# Patient Record
Sex: Female | Born: 1940 | Race: White | Hispanic: No | Marital: Married | Smoking: Former smoker
Health system: Southern US, Community
[De-identification: ages and names within clinical notes are randomized; demographics above are authoritative.]

## PROBLEM LIST (undated history)

## (undated) DIAGNOSIS — R519 Headache, unspecified: Secondary | ICD-10-CM

## (undated) DIAGNOSIS — Z8709 Personal history of other diseases of the respiratory system: Secondary | ICD-10-CM

## (undated) DIAGNOSIS — G8929 Other chronic pain: Secondary | ICD-10-CM

## (undated) DIAGNOSIS — I82409 Acute embolism and thrombosis of unspecified deep veins of unspecified lower extremity: Secondary | ICD-10-CM

## (undated) DIAGNOSIS — T8859XA Other complications of anesthesia, initial encounter: Secondary | ICD-10-CM

## (undated) DIAGNOSIS — T4145XA Adverse effect of unspecified anesthetic, initial encounter: Secondary | ICD-10-CM

## (undated) DIAGNOSIS — R3915 Urgency of urination: Secondary | ICD-10-CM

## (undated) DIAGNOSIS — R51 Headache: Secondary | ICD-10-CM

## (undated) DIAGNOSIS — H269 Unspecified cataract: Secondary | ICD-10-CM

## (undated) DIAGNOSIS — M1990 Unspecified osteoarthritis, unspecified site: Secondary | ICD-10-CM

## (undated) DIAGNOSIS — R531 Weakness: Secondary | ICD-10-CM

## (undated) DIAGNOSIS — M549 Dorsalgia, unspecified: Secondary | ICD-10-CM

## (undated) DIAGNOSIS — M8580 Other specified disorders of bone density and structure, unspecified site: Secondary | ICD-10-CM

## (undated) DIAGNOSIS — Z8614 Personal history of Methicillin resistant Staphylococcus aureus infection: Secondary | ICD-10-CM

## (undated) HISTORY — PX: BACK SURGERY: SHX140

## (undated) HISTORY — PX: COLONOSCOPY: SHX174

---

## 1964-07-02 HISTORY — PX: APPENDECTOMY: SHX54

## 1984-07-02 HISTORY — PX: TUBAL LIGATION: SHX77

## 1999-06-20 ENCOUNTER — Encounter: Admission: RE | Admit: 1999-06-20 | Discharge: 1999-06-20 | Payer: Self-pay | Admitting: Family Medicine

## 1999-06-20 ENCOUNTER — Encounter: Payer: Self-pay | Admitting: Family Medicine

## 1999-11-20 ENCOUNTER — Ambulatory Visit (HOSPITAL_COMMUNITY): Admission: RE | Admit: 1999-11-20 | Discharge: 1999-11-20 | Payer: Self-pay | Admitting: Gastroenterology

## 1999-11-23 ENCOUNTER — Encounter: Payer: Self-pay | Admitting: Family Medicine

## 1999-11-23 ENCOUNTER — Encounter: Admission: RE | Admit: 1999-11-23 | Discharge: 1999-11-23 | Payer: Self-pay | Admitting: Family Medicine

## 2000-03-29 ENCOUNTER — Ambulatory Visit (HOSPITAL_COMMUNITY): Admission: RE | Admit: 2000-03-29 | Discharge: 2000-03-29 | Payer: Self-pay | Admitting: Family Medicine

## 2000-11-29 ENCOUNTER — Encounter: Admission: RE | Admit: 2000-11-29 | Discharge: 2000-11-29 | Payer: Self-pay | Admitting: Family Medicine

## 2000-11-29 ENCOUNTER — Encounter: Payer: Self-pay | Admitting: Family Medicine

## 2001-10-03 ENCOUNTER — Emergency Department (HOSPITAL_COMMUNITY): Admission: EM | Admit: 2001-10-03 | Discharge: 2001-10-04 | Payer: Self-pay | Admitting: Emergency Medicine

## 2001-12-09 ENCOUNTER — Encounter: Admission: RE | Admit: 2001-12-09 | Discharge: 2001-12-09 | Payer: Self-pay | Admitting: Family Medicine

## 2001-12-09 ENCOUNTER — Encounter: Payer: Self-pay | Admitting: Family Medicine

## 2002-06-08 ENCOUNTER — Encounter: Payer: Self-pay | Admitting: Family Medicine

## 2002-06-08 ENCOUNTER — Encounter: Admission: RE | Admit: 2002-06-08 | Discharge: 2002-06-08 | Payer: Self-pay | Admitting: Family Medicine

## 2003-01-27 ENCOUNTER — Encounter: Admission: RE | Admit: 2003-01-27 | Discharge: 2003-01-27 | Payer: Self-pay | Admitting: Family Medicine

## 2003-01-27 ENCOUNTER — Encounter: Payer: Self-pay | Admitting: Family Medicine

## 2004-02-09 ENCOUNTER — Encounter: Admission: RE | Admit: 2004-02-09 | Discharge: 2004-02-09 | Payer: Self-pay | Admitting: Family Medicine

## 2004-03-17 ENCOUNTER — Ambulatory Visit (HOSPITAL_COMMUNITY): Admission: RE | Admit: 2004-03-17 | Discharge: 2004-03-17 | Payer: Self-pay | Admitting: Family Medicine

## 2004-04-24 ENCOUNTER — Other Ambulatory Visit: Admission: RE | Admit: 2004-04-24 | Discharge: 2004-04-24 | Payer: Self-pay | Admitting: Family Medicine

## 2005-03-08 ENCOUNTER — Encounter: Admission: RE | Admit: 2005-03-08 | Discharge: 2005-03-08 | Payer: Self-pay | Admitting: Family Medicine

## 2005-04-24 ENCOUNTER — Encounter: Admission: RE | Admit: 2005-04-24 | Discharge: 2005-04-24 | Payer: Self-pay | Admitting: Family Medicine

## 2005-08-02 ENCOUNTER — Other Ambulatory Visit: Admission: RE | Admit: 2005-08-02 | Discharge: 2005-08-02 | Payer: Self-pay | Admitting: Physician Assistant

## 2006-04-04 ENCOUNTER — Encounter: Admission: RE | Admit: 2006-04-04 | Discharge: 2006-04-04 | Payer: Self-pay | Admitting: Family Medicine

## 2006-08-06 ENCOUNTER — Other Ambulatory Visit: Admission: RE | Admit: 2006-08-06 | Discharge: 2006-08-06 | Payer: Self-pay | Admitting: Family Medicine

## 2006-11-29 ENCOUNTER — Encounter: Admission: RE | Admit: 2006-11-29 | Discharge: 2006-11-29 | Payer: Self-pay | Admitting: Neurosurgery

## 2007-04-11 ENCOUNTER — Encounter: Admission: RE | Admit: 2007-04-11 | Discharge: 2007-04-11 | Payer: Self-pay | Admitting: Family Medicine

## 2007-07-03 DIAGNOSIS — Z8614 Personal history of Methicillin resistant Staphylococcus aureus infection: Secondary | ICD-10-CM

## 2007-07-03 HISTORY — DX: Personal history of Methicillin resistant Staphylococcus aureus infection: Z86.14

## 2007-07-03 HISTORY — PX: OTHER SURGICAL HISTORY: SHX169

## 2007-07-03 HISTORY — PX: ABDOMINAL HYSTERECTOMY: SHX81

## 2007-08-08 ENCOUNTER — Encounter: Admission: RE | Admit: 2007-08-08 | Discharge: 2007-08-08 | Payer: Self-pay | Admitting: Neurosurgery

## 2007-10-06 ENCOUNTER — Other Ambulatory Visit: Payer: Self-pay | Admitting: Neurosurgery

## 2007-10-20 ENCOUNTER — Ambulatory Visit (HOSPITAL_COMMUNITY): Admission: RE | Admit: 2007-10-20 | Discharge: 2007-10-21 | Payer: Self-pay | Admitting: Neurosurgery

## 2008-04-16 ENCOUNTER — Encounter: Admission: RE | Admit: 2008-04-16 | Discharge: 2008-04-16 | Payer: Self-pay | Admitting: Family Medicine

## 2008-06-22 ENCOUNTER — Encounter (INDEPENDENT_AMBULATORY_CARE_PROVIDER_SITE_OTHER): Payer: Self-pay | Admitting: Obstetrics and Gynecology

## 2008-06-22 ENCOUNTER — Ambulatory Visit (HOSPITAL_COMMUNITY): Admission: RE | Admit: 2008-06-22 | Discharge: 2008-06-23 | Payer: Self-pay | Admitting: Obstetrics and Gynecology

## 2008-09-23 ENCOUNTER — Encounter: Admission: RE | Admit: 2008-09-23 | Discharge: 2008-09-23 | Payer: Self-pay | Admitting: Neurosurgery

## 2009-06-07 ENCOUNTER — Inpatient Hospital Stay (HOSPITAL_COMMUNITY): Admission: RE | Admit: 2009-06-07 | Discharge: 2009-06-15 | Payer: Self-pay | Admitting: Neurosurgery

## 2009-12-15 ENCOUNTER — Encounter: Admission: RE | Admit: 2009-12-15 | Discharge: 2009-12-15 | Payer: Self-pay | Admitting: Family Medicine

## 2010-07-23 ENCOUNTER — Encounter: Payer: Self-pay | Admitting: Family Medicine

## 2010-10-03 LAB — BASIC METABOLIC PANEL
BUN: 10 mg/dL (ref 6–23)
BUN: 16 mg/dL (ref 6–23)
CO2: 27 mEq/L (ref 19–32)
Calcium: 8.6 mg/dL (ref 8.4–10.5)
Chloride: 102 mEq/L (ref 96–112)
Creatinine, Ser: 0.87 mg/dL (ref 0.4–1.2)
Creatinine, Ser: 0.88 mg/dL (ref 0.4–1.2)
GFR calc Af Amer: >60 mL/min (ref >60)
GFR calc non Af Amer: >60 mL/min (ref >60)
Glucose, Bld: 102 mg/dL — ABNORMAL HIGH (ref 70–99)
Glucose, Bld: 94 mg/dL (ref 70–99)
Potassium: 3.9 mEq/L (ref 3.5–5.1)

## 2010-10-03 LAB — CBC
HCT: 36.3 % (ref 36.0–46.0)
HCT: 40.2 % (ref 36.0–46.0)
Hemoglobin: 12.2 g/dL (ref 12.0–15.0)
Hemoglobin: 14.1 g/dL (ref 12.0–15.0)
MCHC: 33.7 g/dL (ref 30.0–36.0)
MCV: 92.7 fL (ref 78.0–100.0)
RBC: 3.83 MIL/uL — ABNORMAL LOW (ref 3.87–5.11)
RBC: 4.34 MIL/uL (ref 3.87–5.11)

## 2010-10-03 LAB — TYPE AND SCREEN: Antibody Screen: NEGATIVE

## 2010-11-14 NOTE — Op Note (Signed)
NAMEELIZET, KAPLAN NO.:  [id number]   MEDICAL RECORD NO.:  [id number]          PATIENT TYPE:  INP   LOCATION:  3010                         FACILITY:  [facility]   PHYSICIAN:  Danae Orleans. Venetia Maxon, M.D.  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   PROCEDURE:  1. Left L5-S1 microdiskectomy.  2. Microdissection.  3. Laminoforaminotomy L4-5 left with Baxano decompression.   SURGEON:  Danae Orleans. Venetia Maxon, M.D.   ASSISTANT:  Hewitt Shorts, M.D. and Georgiann Cocker, RN.   ANESTHESIA:  General endotracheal anesthesia.   ESTIMATED BLOOD LOSS:  Minimal.   COMPLICATIONS:  None.   DISPOSITION:  Recovery.   INDICATIONS:  Sandra [name] is a 70 year old woman with left leg pain,  lumbar scoliosis, and spondylosis with a disk herniation at L5-S1 on the  left and significant lateral recess stenosis at L4-5 on the left.  It  was elected to take her to surgery for decompression at the L4-5 level  using Baxano decompression system and left L5-S1 microdiskectomy.   PROCEDURE:  Sandra [name] was brought to the operating room.  Following  satisfactory and uncomplicated induction of general endotracheal  anesthesia and placed venous lines, the patient was placed in prone  position on the Wilson frame.  Her low back was prepped and draped in  the usual sterile fashion using a Betadine scrub and paint.  Incision  was made in the midline, carried through to the lumbodorsal fascia was  incised in the left side of midline.  Subperiosteal dissection was  performed exposing the L4-5 and L5-S1 interspace on the left.  Using the  C-arm for fluoroscopy to confirm the level  hemi-semi laminectomy of L5  was performed and the redundant ligamentous tissue was removed.  The  thecal sac and S1 nerve root decompressed.  There was a large disk  herniation direct with a fragment of herniated disk material directly  beneath the S1 nerve root.  The D'Errico nerve root retractor and  microdissection technique, the S1 nerve root was retracted medially.  The thecal sac was retracted medially and the herniated disk was  removed.  The disk herniation extended into the interspace and it was  elected to more aggressively decompress the interspace because of this  using a variety of Epstein curettes and a variety of pituitary rongeurs,  the disk material was removed from the interspace laterally and  decompressing the thecal sac, redundant ligamentous tissue, and annular  tissue was also removed Kerrison rongeur.  Subsequently, the L4-5 level  laminotomy was performed with a high-speed drill and laminar window of  the ligamentous tissue was removed exposing the dura using the Baxano  decompression system the lateral thecal sac and foraminal pass was  performed with the removal of ligamentous tissue after introducing the  probe,  then wire followed by EMG nerve root potential with recordings  along the lateral sac and along the foramen.  Subsequently, the lateral  canal and foramen were decompressed and using 30 reciprocating strokes  after confirming no evidence of electrical activity with the rib in  stimulation probe, the multi blade shaver was introduced and 30  reciprocations were used with good decompression of the lateral canal  and along the foramen.  A second pass was then performed along the L5  nerve root and again there was no evidence of electrical stimulation and  30 reciprocations were performed.  Coronary dilator probe was inserted  along the foramen in and out the nerve root along the nerve root and did  not appear to be any residual nerve root compression.   Hemostasis was  assured with Gelfoam soaked in thrombin.  The wound was then copiously  irrigated bacitracin and saline.  The lumbodorsal fascia was closed with  0 Vicryl sutures, subcutaneous tissues were reapproximated 2-0 Vicryl  interrupted inverted sutures and skin edges were reapproximated 3-0  Vicryl subcuticular stitch.  Wound was dressed with Dermabond.  The  patient was extubated in the operating room and taken to recovery in  stable and satisfactory condition having tolerated operation well.  Counts were correct at the end of the case.      Danae Orleans. Venetia Maxon, M.D.  Electronically Signed     JDS/MEDQ  D:  10/20/2007  T:  10/21/2007  Job:  [id number]

## 2010-11-14 NOTE — Op Note (Signed)
NAME:  Sandra [name], Sandra [name] NO.:  [id number]   MEDICAL RECORD NO.:  [id number]          PATIENT TYPE:  INP   LOCATION:  9320                          FACILITY:  [facility]   PHYSICIAN:  Charles A. Delcambre, MDDATE OF BIRTH:  06/25/1941   DATE OF PROCEDURE:  06/22/2008  DATE OF DISCHARGE:                               OPERATIVE REPORT   PREOPERATIVE DIAGNOSES:  1. Cystocele.  2. Uterine prolapse.   PROCEDURES:  Laparoscopic-assisted vaginal hysterectomy and bilateral  salpingo-oophorectomy anterior repair.   SURGEON:  Charles A. Sydnee Cabal, MD   ASSISTANT:  Dr. Richardson Dopp.   COMPLICATIONS:  None.   ESTIMATED BLOOD LOSS:  100 mL.   ANESTHESIA:  General anesthesia.   FINDINGS:  Moderate cystocele, uterus prolapsed down to the introitus.  Normal upper abdominal findings grossly noted, normal pelvis otherwise.   URINE OUTPUT:  200 mL.   COMPLICATIONS:  None.   DESCRIPTION OF PROCEDURE:  The patient was taken to the operating room,  placed supine in position.  General anesthetic was induced without  difficulty.  She was then placed in dorsal lithotomy position.  Sterile  prep and drape was undertaken.  A Cohen cannula was placed on the cervix  for manipulation during the case.  Attention was turned to the abdomen.  Betadine cleansing of the umbilicus with sterile prep and drape was done  to get some material out of the umbilicus.  The area was recleaned with  copious Betadine.  A 1-cm incision was made in the skin crease at the  umbilicus.  Anterior traction was placed.  Veress needle was placed.  Cohen cannula was placed with tenaculum on the cervix.  Assessment at  that time yielded only cystocele, no rectocele, and again the uterine  prolapse as noted above.  Attention was turned to the abdomen.  Veress  needle was placed with anterior traction on the abdominal wall.  Aspiration, injection and reaspiration, hanging drop test all indicated  intra-abdominal placement. CO2  insufflation pressure remained less than  8 mm Hg to adequate pneumoperitoneum..  After 2 L insufflation of CO2,  the Veress needle was removed.  A 10-mm port was placed.  Immediate  verification was done showing no evidence of damage to the bowel,  bladder, or vascular structures.  Two stab incisions were made laterally  below the umbilicus.  These injection sites were injected with 0.25%  Marcaine, total of approximately 8 mL.  We utilized the PK instrument to  cauterize and seal the isolated infundibulopelvic pedicles, well away  from the ureters bilaterally.  Ureters were clearly seen around the  sidewall using the PK with direct visualization of the ureter distally  as well as proximally.  Great care was given to avoid bowel, bladder, or  vascular structures.  The infundibulopelvic pedicles were transected  bilaterally.  After localizing the ureter, the round ligaments were  taken and vesicouterine peritoneum was skeletonized to achieve  assistance with anterior opening.  Desufflation was allowed to occur at  that point.  The attention was then turned to the pelvis and the vaginal  part of  the case.  Dilute Pitressin was injected circumferentially on  the cervix.  The bladder was then taken off of the cervix and lower  uterine segment with blunt dissection.  A Ray-Tec and a finger was  passed up the vesicovaginal space and entered the peritoneum.  Retractor  was placed.  Posterior colpotomy was done, retractor was placed.  Uterosacral ligaments were then isolated, tied, and held with 0 Vicryl.  Successive pedicles up the uterus took the uterine vessels with  transfixion stitch and good hemostasis resulting.  A final pedicle was  taken up beyond the portion of the laparoscopic dissection and the  uterus was removed.  All stitches were removed except the uterosacral  ligaments.  Richardson angle suture was then placed on either end  suspending the vagina to the uterosacral ligaments.   Cuff was then  closed after anterior repair was done.  Anterior repair was done by  undermining vaginal mucosa, cystocele mucosa.  Metzenbaums were used to  open the cystocele and blunt dissection was used to develop the bladder  flaps.  2-0 Vicryl sutures plicating the endopelvic fascia with a strong  layer created to support the bladder and eliminate the cystocele.  Excess vaginal mucosa was removed and discarded.  The vaginal mucosa was  then closed with running locking 2-0 Vicryl.  Hemostsasis was good.  The  cuff was then run and locked with 0 Vicryl, 2-inch gauze with Estrace  cream was packed in vagina.  A final look with the scope above  pneumoperitoneum was achieved.  There was a raw edge oozing slightly  over the left sidewall above the uterosacral ligament but away from the  visualized ureter.  This was cauterized without damage to achieve good  hemostasis.  Low-pressure visualization was noted.  All instruments  removed.  Desufflation was allowed to occur.  A 0 Vicryl was used at the  umbilicus to close the fascial stitch.  A 4-0 Monocryl was used for the  skin.  Dermabond was placed on the lower two trocar sites.  The patient  was awakened, extubated, and taken to recovery having tolerated the  procedure well with physician in attendance.      Charles A. Sydnee Cabal, MD  Electronically Signed     CAD/MEDQ  D:  06/22/2008  T:  06/22/2008  Job:  [id number]

## 2010-11-17 NOTE — Procedures (Signed)
[facility]  Patient:    Sandra [name], Sandra [name]                        MRN: [id number] Proc. Date: 11/20/99 Adm. Date:  [id number] Disc. Date: [id number] Attending:  Louie Bun CC:         Jethro Bastos, M.D.                           Procedure Report  PROCEDURE PERFORMED:  Colonoscopy.  ENDOSCOPIST:  Everardo All. Madilyn Fireman, M.D.  INDICATIONS FOR PROCEDURE:  Family history of colon cancer in a first degree relative.  DESCRIPTION OF PROCEDURE:  The patient was placed in the left lateral decubitus position and placed on the pulse monitor with continuous low-flow oxygen delivered by nasal cannula.  She was sedated with 60 mg IV Demerol and 7 mg IV Versed.  The Olympus video colonoscope was inserted into the rectum and advanced to the cecum, confirmed by transillumination of McBurneys point and visualization of the ileocecal valve and appendiceal orifice.  The prep was excellent.  The cecum, ascending, transverse, descending and sigmoid colon all appeared normal with no masses, polyps, diverticula or other mucosal abnormalities.  The rectum likewise appeared normal on retroflex view.  The anus did reveal some small internal hemorrhoids.  The colonoscope was then withdrawn and the patient returned to the recovery room in stable condition. The patient tolerated the procedure well and there were no immediate complications.  IMPRESSION:  Internal hemorrhoids, otherwise normal colonoscopy.  PLAN:  Repeat colonoscopy in five years based on her family history. DD:  11/20/99 TD:  11/23/99 Job: [id number]

## 2010-11-21 ENCOUNTER — Other Ambulatory Visit: Payer: Self-pay | Admitting: Family Medicine

## 2010-11-21 DIAGNOSIS — Z1231 Encounter for screening mammogram for malignant neoplasm of breast: Secondary | ICD-10-CM

## 2010-11-24 ENCOUNTER — Other Ambulatory Visit: Payer: Self-pay | Admitting: Family Medicine

## 2010-11-24 ENCOUNTER — Ambulatory Visit
Admission: RE | Admit: 2010-11-24 | Discharge: 2010-11-24 | Disposition: A | Discharge status: Home / Self Care | Payer: Medicare Other | Source: Ambulatory Visit | Attending: Family Medicine | Admitting: Family Medicine

## 2010-11-24 DIAGNOSIS — S20219A Contusion of unspecified front wall of thorax, initial encounter: Secondary | ICD-10-CM

## 2010-12-26 ENCOUNTER — Ambulatory Visit
Admission: RE | Admit: 2010-12-26 | Discharge: 2010-12-26 | Disposition: A | Discharge status: Home / Self Care | Payer: Medicare Other | Source: Ambulatory Visit | Attending: Family Medicine | Admitting: Family Medicine

## 2010-12-26 DIAGNOSIS — Z1231 Encounter for screening mammogram for malignant neoplasm of breast: Secondary | ICD-10-CM

## 2011-03-27 LAB — CBC
HCT: 40.7
Hemoglobin: 13.9
MCHC: 34.2
Platelets: 270
RDW: 12.4
RDW: 12.6

## 2011-03-27 LAB — BASIC METABOLIC PANEL
BUN: 8
BUN: 12
CO2: 30
Calcium: 10.3
Calcium: 9.7
GFR calc non Af Amer: >60
Glucose, Bld: 95
Glucose, Bld: 91
Sodium: 139
Sodium: 137

## 2011-04-06 LAB — COMPREHENSIVE METABOLIC PANEL
ALT: 12 U/L (ref 0–35)
Alkaline Phosphatase: 48 U/L (ref 39–117)
CO2: 27 mEq/L (ref 19–32)
GFR calc non Af Amer: >60 mL/min (ref >60)
Glucose, Bld: 85 mg/dL (ref 70–99)
Potassium: 4.1 mEq/L (ref 3.5–5.1)
Sodium: 135 mEq/L (ref 135–145)

## 2011-04-06 LAB — CBC
HCT: 29.4 % — ABNORMAL LOW (ref 36.0–46.0)
Hemoglobin: 13.2 g/dL (ref 12.0–15.0)
MCV: 92.9 fL (ref 78.0–100.0)
Platelets: 165 10*3/uL (ref 150–400)
RBC: 4.28 MIL/uL (ref 3.87–5.11)
WBC: 10.1 10*3/uL (ref 4.0–10.5)

## 2011-04-06 LAB — ABO/RH: ABO/RH(D): O POS

## 2011-07-16 DIAGNOSIS — Z79899 Other long term (current) drug therapy: Secondary | ICD-10-CM | POA: Diagnosis not present

## 2011-11-12 DIAGNOSIS — M4120 Other idiopathic scoliosis, site unspecified: Secondary | ICD-10-CM | POA: Diagnosis not present

## 2011-11-13 ENCOUNTER — Other Ambulatory Visit: Payer: Self-pay | Admitting: Neurosurgery

## 2011-11-13 ENCOUNTER — Other Ambulatory Visit (HOSPITAL_COMMUNITY): Payer: Self-pay | Admitting: Neurosurgery

## 2011-11-13 DIAGNOSIS — M546 Pain in thoracic spine: Secondary | ICD-10-CM

## 2011-11-15 ENCOUNTER — Ambulatory Visit (HOSPITAL_COMMUNITY)
Admission: RE | Admit: 2011-11-15 | Discharge: 2011-11-15 | Disposition: A | Discharge status: Home / Self Care | Payer: Medicare Other | Source: Ambulatory Visit | Attending: Neurosurgery | Admitting: Neurosurgery

## 2011-11-15 DIAGNOSIS — M5126 Other intervertebral disc displacement, lumbar region: Secondary | ICD-10-CM | POA: Diagnosis not present

## 2011-11-15 DIAGNOSIS — M47817 Spondylosis without myelopathy or radiculopathy, lumbosacral region: Secondary | ICD-10-CM | POA: Diagnosis not present

## 2011-11-15 DIAGNOSIS — R209 Unspecified disturbances of skin sensation: Secondary | ICD-10-CM | POA: Diagnosis not present

## 2011-11-15 DIAGNOSIS — M546 Pain in thoracic spine: Secondary | ICD-10-CM | POA: Diagnosis not present

## 2011-11-15 DIAGNOSIS — Y831 Surgical operation with implant of artificial internal device as the cause of abnormal reaction of the patient, or of later complication, without mention of misadventure at the time of the procedure: Secondary | ICD-10-CM | POA: Insufficient documentation

## 2011-11-15 DIAGNOSIS — M51379 Other intervertebral disc degeneration, lumbosacral region without mention of lumbar back pain or lower extremity pain: Secondary | ICD-10-CM | POA: Insufficient documentation

## 2011-11-15 DIAGNOSIS — M899 Disorder of bone, unspecified: Secondary | ICD-10-CM | POA: Insufficient documentation

## 2011-11-15 DIAGNOSIS — M25559 Pain in unspecified hip: Secondary | ICD-10-CM | POA: Insufficient documentation

## 2011-11-15 DIAGNOSIS — M4120 Other idiopathic scoliosis, site unspecified: Secondary | ICD-10-CM | POA: Diagnosis not present

## 2011-11-15 DIAGNOSIS — M4310 Spondylolisthesis, site unspecified: Secondary | ICD-10-CM | POA: Diagnosis not present

## 2011-11-15 DIAGNOSIS — T84498A Other mechanical complication of other internal orthopedic devices, implants and grafts, initial encounter: Secondary | ICD-10-CM | POA: Diagnosis not present

## 2011-11-15 DIAGNOSIS — M5137 Other intervertebral disc degeneration, lumbosacral region: Secondary | ICD-10-CM | POA: Insufficient documentation

## 2011-11-15 DIAGNOSIS — M519 Unspecified thoracic, thoracolumbar and lumbosacral intervertebral disc disorder: Secondary | ICD-10-CM | POA: Diagnosis not present

## 2011-11-15 MED ORDER — ONDANSETRON HCL 4 MG/2ML IJ SOLN: 4.0000 mg | Freq: Four times a day (QID) | INTRAMUSCULAR | Status: DC | PRN

## 2011-11-15 MED ORDER — IOHEXOL 300 MG/ML  SOLN
10.0000 mL | Freq: Once | INTRAMUSCULAR | Status: AC | PRN
  Administered 2011-11-15: 10 mL via INTRATHECAL

## 2011-11-15 MED ORDER — DIAZEPAM 5 MG PO TABS: 10.0000 mg | ORAL_TABLET | Freq: Once | ORAL | Status: DC

## 2011-11-15 MED ORDER — OXYCODONE HCL 5 MG PO TABS: 10.0000 mg | ORAL_TABLET | ORAL | Status: DC | PRN

## 2011-11-15 MED ORDER — DIAZEPAM 5 MG PO TABS
10.0000 mg | ORAL_TABLET | Freq: Once | ORAL | Status: AC
  Administered 2011-11-15: 10 mg via ORAL
  Filled 2011-11-15: qty 2

## 2011-11-15 NOTE — Discharge Instructions (Signed)
Myelography Care After These instructions give you information on caring for yourself after your procedure. Your doctor may also give you specific instructions. Call your doctor if you have any problems or questions after your procedure. HOME CARE  Lie down for 24 hours. Lie in any position with 1 pillow under your head.   For 24 hours, get up only to eat or use the bathroom. Take only 10 minutes to eat.   For 24 hours, drink enough fluids to keep your pee (urine) clear or pale yellow. No alcohol.   Take all medicine as told by your doctor.   Avoid heavy lifting and activity for 48 hours.   You may take the bandage off the day after your myelography.   Do not take a bath for 24 hours. Ask your doctor if it is okay to take a shower.  Finding out the results of your test Ask your doctor when your test results will be ready. Make sure you follow up and get the test results. GET HELP RIGHT AWAY IF:   Any of the places where the needles were put in:   Are puffy (swollen) or red.   Are sore or hot to the touch.   Are draining yellowish-white fluid (pus).   Are bleeding after 10 minutes of pressing down on the site. Have someone press on any place that is bleeding until seen by a doctor.   You have a lasting headache that is not helped by medicine.   You have a bad headache with a stiff neck or fever.   You have trouble breathing.   You feel sick to your stomach (nauseous) or throw up (vomit).   You have pain or cramping in your belly (abdomen).   You have a fever.  If you go to the emergency room, tell the doctor you had a myelogram. Take this paper with you to show the doctor. MAKE SURE YOU:  Understand these instructions.   Will watch your condition.   Will get help right away if you are not doing well or get worse.  Document Released: 03/27/2008 Document Revised: 06/07/2011 Document Reviewed: 03/27/2008 Exit[employer] Patient Information 2012 [employer]. 

## 2011-11-15 NOTE — Procedures (Signed)
Omnipaque 300 6 cc, L 2/3

## 2011-11-28 ENCOUNTER — Other Ambulatory Visit: Payer: Self-pay | Admitting: Family Medicine

## 2011-11-28 DIAGNOSIS — Z1231 Encounter for screening mammogram for malignant neoplasm of breast: Secondary | ICD-10-CM

## 2011-12-03 DIAGNOSIS — M5126 Other intervertebral disc displacement, lumbar region: Secondary | ICD-10-CM | POA: Diagnosis not present

## 2011-12-03 DIAGNOSIS — IMO0002 Reserved for concepts with insufficient information to code with codable children: Secondary | ICD-10-CM | POA: Diagnosis not present

## 2011-12-03 DIAGNOSIS — M47817 Spondylosis without myelopathy or radiculopathy, lumbosacral region: Secondary | ICD-10-CM | POA: Diagnosis not present

## 2011-12-03 DIAGNOSIS — M4120 Other idiopathic scoliosis, site unspecified: Secondary | ICD-10-CM | POA: Diagnosis not present

## 2011-12-14 DIAGNOSIS — M4120 Other idiopathic scoliosis, site unspecified: Secondary | ICD-10-CM | POA: Diagnosis not present

## 2011-12-14 DIAGNOSIS — M5126 Other intervertebral disc displacement, lumbar region: Secondary | ICD-10-CM | POA: Diagnosis not present

## 2012-01-01 ENCOUNTER — Ambulatory Visit: Payer: Medicare Other

## 2012-01-17 DIAGNOSIS — M4120 Other idiopathic scoliosis, site unspecified: Secondary | ICD-10-CM | POA: Diagnosis not present

## 2012-01-22 ENCOUNTER — Ambulatory Visit
Admission: RE | Admit: 2012-01-22 | Discharge: 2012-01-22 | Disposition: A | Discharge status: Home / Self Care | Payer: Medicare Other | Source: Ambulatory Visit | Attending: Family Medicine | Admitting: Family Medicine

## 2012-01-22 DIAGNOSIS — Z1231 Encounter for screening mammogram for malignant neoplasm of breast: Secondary | ICD-10-CM | POA: Diagnosis not present

## 2012-01-25 ENCOUNTER — Other Ambulatory Visit: Payer: Self-pay | Admitting: Family Medicine

## 2012-01-25 DIAGNOSIS — R928 Other abnormal and inconclusive findings on diagnostic imaging of breast: Secondary | ICD-10-CM

## 2012-01-30 ENCOUNTER — Ambulatory Visit
Admission: RE | Admit: 2012-01-30 | Discharge: 2012-01-30 | Disposition: A | Discharge status: Home / Self Care | Payer: Medicare Other | Source: Ambulatory Visit | Attending: Family Medicine | Admitting: Family Medicine

## 2012-01-30 ENCOUNTER — Other Ambulatory Visit: Payer: Self-pay | Admitting: Family Medicine

## 2012-01-30 DIAGNOSIS — R928 Other abnormal and inconclusive findings on diagnostic imaging of breast: Secondary | ICD-10-CM

## 2012-05-21 DIAGNOSIS — M4120 Other idiopathic scoliosis, site unspecified: Secondary | ICD-10-CM | POA: Diagnosis not present

## 2012-05-27 DIAGNOSIS — M545 Low back pain, unspecified: Secondary | ICD-10-CM | POA: Diagnosis not present

## 2012-05-27 DIAGNOSIS — M5137 Other intervertebral disc degeneration, lumbosacral region: Secondary | ICD-10-CM | POA: Diagnosis not present

## 2012-06-19 DIAGNOSIS — M545 Low back pain, unspecified: Secondary | ICD-10-CM | POA: Diagnosis not present

## 2012-08-07 DIAGNOSIS — M545 Low back pain, unspecified: Secondary | ICD-10-CM | POA: Diagnosis not present

## 2012-08-07 DIAGNOSIS — M62838 Other muscle spasm: Secondary | ICD-10-CM | POA: Diagnosis not present

## 2012-09-08 DIAGNOSIS — M545 Low back pain, unspecified: Secondary | ICD-10-CM | POA: Diagnosis not present

## 2012-09-25 DIAGNOSIS — M545 Low back pain, unspecified: Secondary | ICD-10-CM | POA: Diagnosis not present

## 2012-10-06 DIAGNOSIS — Z0000 Encounter for general adult medical examination without abnormal findings: Secondary | ICD-10-CM | POA: Diagnosis not present

## 2012-10-06 DIAGNOSIS — N318 Other neuromuscular dysfunction of bladder: Secondary | ICD-10-CM | POA: Diagnosis not present

## 2012-10-06 DIAGNOSIS — Z79899 Other long term (current) drug therapy: Secondary | ICD-10-CM | POA: Diagnosis not present

## 2012-10-06 DIAGNOSIS — M899 Disorder of bone, unspecified: Secondary | ICD-10-CM | POA: Diagnosis not present

## 2012-10-06 DIAGNOSIS — F17200 Nicotine dependence, unspecified, uncomplicated: Secondary | ICD-10-CM | POA: Diagnosis not present

## 2012-10-06 DIAGNOSIS — Z23 Encounter for immunization: Secondary | ICD-10-CM | POA: Diagnosis not present

## 2012-10-06 DIAGNOSIS — E7800 Pure hypercholesterolemia, unspecified: Secondary | ICD-10-CM | POA: Diagnosis not present

## 2012-10-23 DIAGNOSIS — IMO0002 Reserved for concepts with insufficient information to code with codable children: Secondary | ICD-10-CM | POA: Diagnosis not present

## 2012-10-23 DIAGNOSIS — M545 Low back pain, unspecified: Secondary | ICD-10-CM | POA: Diagnosis not present

## 2012-10-23 DIAGNOSIS — M5137 Other intervertebral disc degeneration, lumbosacral region: Secondary | ICD-10-CM | POA: Diagnosis not present

## 2012-11-07 DIAGNOSIS — H43819 Vitreous degeneration, unspecified eye: Secondary | ICD-10-CM | POA: Diagnosis not present

## 2012-11-07 DIAGNOSIS — H269 Unspecified cataract: Secondary | ICD-10-CM | POA: Diagnosis not present

## 2012-11-28 DIAGNOSIS — M545 Low back pain, unspecified: Secondary | ICD-10-CM | POA: Diagnosis not present

## 2012-11-28 DIAGNOSIS — IMO0002 Reserved for concepts with insufficient information to code with codable children: Secondary | ICD-10-CM | POA: Diagnosis not present

## 2012-12-11 DIAGNOSIS — N951 Menopausal and female climacteric states: Secondary | ICD-10-CM | POA: Diagnosis not present

## 2012-12-23 ENCOUNTER — Other Ambulatory Visit: Payer: Self-pay

## 2012-12-23 DIAGNOSIS — Z1231 Encounter for screening mammogram for malignant neoplasm of breast: Secondary | ICD-10-CM

## 2013-01-19 DIAGNOSIS — D235 Other benign neoplasm of skin of trunk: Secondary | ICD-10-CM | POA: Diagnosis not present

## 2013-01-19 DIAGNOSIS — F17200 Nicotine dependence, unspecified, uncomplicated: Secondary | ICD-10-CM | POA: Diagnosis not present

## 2013-01-30 ENCOUNTER — Ambulatory Visit: Payer: Medicare Other

## 2013-02-20 ENCOUNTER — Ambulatory Visit
Admission: RE | Admit: 2013-02-20 | Discharge: 2013-02-20 | Disposition: A | Discharge status: Home / Self Care | Payer: Medicare Other | Source: Ambulatory Visit

## 2013-02-20 DIAGNOSIS — Z1231 Encounter for screening mammogram for malignant neoplasm of breast: Secondary | ICD-10-CM | POA: Diagnosis not present

## 2013-02-27 DIAGNOSIS — H2510 Age-related nuclear cataract, unspecified eye: Secondary | ICD-10-CM | POA: Diagnosis not present

## 2013-02-27 DIAGNOSIS — H5210 Myopia, unspecified eye: Secondary | ICD-10-CM | POA: Diagnosis not present

## 2013-02-27 DIAGNOSIS — H25019 Cortical age-related cataract, unspecified eye: Secondary | ICD-10-CM | POA: Diagnosis not present

## 2013-02-27 DIAGNOSIS — H25049 Posterior subcapsular polar age-related cataract, unspecified eye: Secondary | ICD-10-CM | POA: Diagnosis not present

## 2013-02-27 DIAGNOSIS — H18419 Arcus senilis, unspecified eye: Secondary | ICD-10-CM | POA: Diagnosis not present

## 2013-03-05 DIAGNOSIS — M545 Low back pain, unspecified: Secondary | ICD-10-CM | POA: Diagnosis not present

## 2013-03-05 DIAGNOSIS — M25539 Pain in unspecified wrist: Secondary | ICD-10-CM | POA: Diagnosis not present

## 2013-03-05 DIAGNOSIS — IMO0002 Reserved for concepts with insufficient information to code with codable children: Secondary | ICD-10-CM | POA: Diagnosis not present

## 2013-03-10 DIAGNOSIS — D126 Benign neoplasm of colon, unspecified: Secondary | ICD-10-CM | POA: Diagnosis not present

## 2013-03-10 DIAGNOSIS — L138 Other specified bullous disorders: Secondary | ICD-10-CM | POA: Diagnosis not present

## 2013-03-10 DIAGNOSIS — L408 Other psoriasis: Secondary | ICD-10-CM | POA: Diagnosis not present

## 2013-03-10 DIAGNOSIS — R32 Unspecified urinary incontinence: Secondary | ICD-10-CM | POA: Diagnosis not present

## 2013-03-10 DIAGNOSIS — M79609 Pain in unspecified limb: Secondary | ICD-10-CM | POA: Diagnosis not present

## 2013-03-10 DIAGNOSIS — M48061 Spinal stenosis, lumbar region without neurogenic claudication: Secondary | ICD-10-CM | POA: Diagnosis not present

## 2013-03-10 DIAGNOSIS — E559 Vitamin D deficiency, unspecified: Secondary | ICD-10-CM | POA: Diagnosis not present

## 2013-03-20 DIAGNOSIS — M76829 Posterior tibial tendinitis, unspecified leg: Secondary | ICD-10-CM | POA: Diagnosis not present

## 2013-03-20 DIAGNOSIS — Q666 Other congenital valgus deformities of feet: Secondary | ICD-10-CM | POA: Diagnosis not present

## 2013-04-01 DIAGNOSIS — IMO0002 Reserved for concepts with insufficient information to code with codable children: Secondary | ICD-10-CM | POA: Diagnosis not present

## 2013-04-01 DIAGNOSIS — M545 Low back pain, unspecified: Secondary | ICD-10-CM | POA: Diagnosis not present

## 2013-04-21 DIAGNOSIS — M545 Low back pain, unspecified: Secondary | ICD-10-CM | POA: Diagnosis not present

## 2013-04-21 DIAGNOSIS — IMO0002 Reserved for concepts with insufficient information to code with codable children: Secondary | ICD-10-CM | POA: Diagnosis not present

## 2013-05-15 DIAGNOSIS — M76829 Posterior tibial tendinitis, unspecified leg: Secondary | ICD-10-CM | POA: Diagnosis not present

## 2013-05-19 DIAGNOSIS — M25539 Pain in unspecified wrist: Secondary | ICD-10-CM | POA: Diagnosis not present

## 2013-05-19 DIAGNOSIS — IMO0002 Reserved for concepts with insufficient information to code with codable children: Secondary | ICD-10-CM | POA: Diagnosis not present

## 2013-05-19 DIAGNOSIS — M545 Low back pain, unspecified: Secondary | ICD-10-CM | POA: Diagnosis not present

## 2013-06-01 DIAGNOSIS — M545 Low back pain, unspecified: Secondary | ICD-10-CM | POA: Diagnosis not present

## 2013-06-01 DIAGNOSIS — IMO0002 Reserved for concepts with insufficient information to code with codable children: Secondary | ICD-10-CM | POA: Diagnosis not present

## 2013-06-05 DIAGNOSIS — M76829 Posterior tibial tendinitis, unspecified leg: Secondary | ICD-10-CM | POA: Diagnosis not present

## 2013-07-03 DIAGNOSIS — M76829 Posterior tibial tendinitis, unspecified leg: Secondary | ICD-10-CM | POA: Diagnosis not present

## 2013-07-22 DIAGNOSIS — M545 Low back pain, unspecified: Secondary | ICD-10-CM | POA: Diagnosis not present

## 2013-07-22 DIAGNOSIS — IMO0002 Reserved for concepts with insufficient information to code with codable children: Secondary | ICD-10-CM | POA: Diagnosis not present

## 2013-08-20 DIAGNOSIS — IMO0002 Reserved for concepts with insufficient information to code with codable children: Secondary | ICD-10-CM | POA: Diagnosis not present

## 2013-08-20 DIAGNOSIS — M545 Low back pain, unspecified: Secondary | ICD-10-CM | POA: Diagnosis not present

## 2013-09-01 DIAGNOSIS — R6889 Other general symptoms and signs: Secondary | ICD-10-CM | POA: Diagnosis not present

## 2013-09-01 DIAGNOSIS — J111 Influenza due to unidentified influenza virus with other respiratory manifestations: Secondary | ICD-10-CM | POA: Diagnosis not present

## 2013-09-01 DIAGNOSIS — F17200 Nicotine dependence, unspecified, uncomplicated: Secondary | ICD-10-CM | POA: Diagnosis not present

## 2013-09-01 DIAGNOSIS — J209 Acute bronchitis, unspecified: Secondary | ICD-10-CM | POA: Diagnosis not present

## 2013-09-05 ENCOUNTER — Emergency Department (HOSPITAL_COMMUNITY)
Admission: EM | Admit: 2013-09-05 | Discharge: 2013-09-07 | Disposition: A | Discharge status: Home / Self Care | Payer: Medicare Other | Attending: Emergency Medicine | Admitting: Emergency Medicine

## 2013-09-05 ENCOUNTER — Encounter (HOSPITAL_COMMUNITY): Payer: Self-pay | Admitting: Emergency Medicine

## 2013-09-05 ENCOUNTER — Emergency Department (HOSPITAL_COMMUNITY): Payer: Medicare Other

## 2013-09-05 DIAGNOSIS — Z79899 Other long term (current) drug therapy: Secondary | ICD-10-CM | POA: Diagnosis not present

## 2013-09-05 DIAGNOSIS — R0989 Other specified symptoms and signs involving the circulatory and respiratory systems: Secondary | ICD-10-CM | POA: Diagnosis not present

## 2013-09-05 DIAGNOSIS — J111 Influenza due to unidentified influenza virus with other respiratory manifestations: Secondary | ICD-10-CM | POA: Diagnosis not present

## 2013-09-05 DIAGNOSIS — F17200 Nicotine dependence, unspecified, uncomplicated: Secondary | ICD-10-CM | POA: Diagnosis not present

## 2013-09-05 DIAGNOSIS — Z7982 Long term (current) use of aspirin: Secondary | ICD-10-CM | POA: Diagnosis not present

## 2013-09-05 LAB — CBC
HCT: 43.6 % (ref 36.0–46.0)
Hemoglobin: 15.7 g/dL — ABNORMAL HIGH (ref 12.0–15.0)
MCH: 32 pg (ref 26.0–34.0)
MCHC: 36 g/dL (ref 30.0–36.0)
MCV: 89 fL (ref 78.0–100.0)
Platelets: 189 10*3/uL (ref 150–400)
RBC: 4.9 MIL/uL (ref 3.87–5.11)
RDW: 12.6 % (ref 11.5–15.5)
WBC: 8.2 10*3/uL (ref 4.0–10.5)

## 2013-09-05 LAB — I-STAT TROPONIN, ED: Troponin i, poc: 0.01 ng/mL (ref 0.00–0.08)

## 2013-09-05 LAB — I-STAT CHEM 8, ED
BUN: 14 mg/dL (ref 6–23)
Calcium, Ion: 1.15 mmol/L (ref 1.13–1.30)
Chloride: 100 mEq/L (ref 96–112)
Creatinine, Ser: 1 mg/dL (ref 0.50–1.10)
GLUCOSE: 112 mg/dL — AB (ref 70–99)
HCT: 47 % — ABNORMAL HIGH (ref 36.0–46.0)
HEMOGLOBIN: 16 g/dL — AB (ref 12.0–15.0)
Potassium: 3.9 mEq/L (ref 3.7–5.3)
SODIUM: 139 meq/L (ref 137–147)
TCO2: 24 mmol/L (ref 0–100)

## 2013-09-05 LAB — D-DIMER, QUANTITATIVE: D-Dimer, Quant: 0.45 ug/mL-FEU (ref 0.00–0.48)

## 2013-09-05 LAB — PRO B NATRIURETIC PEPTIDE: Pro B Natriuretic peptide (BNP): 237.7 pg/mL — ABNORMAL HIGH (ref 0–125)

## 2013-09-05 MED ORDER — SODIUM CHLORIDE 0.9 % IV BOLUS (SEPSIS)
1000.0000 mL | Freq: Once | INTRAVENOUS | Status: AC
  Administered 2013-09-05: 1000 mL via INTRAVENOUS

## 2013-09-05 MED ORDER — ACETAMINOPHEN 325 MG PO TABS
650.0000 mg | ORAL_TABLET | Freq: Once | ORAL | Status: AC
  Administered 2013-09-05: 650 mg via ORAL
  Filled 2013-09-05: qty 2

## 2013-09-05 MED ORDER — HYDROXYZINE HCL 25 MG PO TABS: 25.0000 mg | ORAL_TABLET | Freq: Every evening | ORAL | Status: DC | PRN

## 2013-09-05 NOTE — ED Provider Notes (Addendum)
CSN: [id number]     Arrival date & time 09/05/13  0101 History   First MD Initiated Contact with Patient 09/05/13 0229     Chief Complaint  Patient presents with  . Influenza     (Consider location/radiation/quality/duration/timing/severity/associated sxs/prior Treatment) HPI History per patient. Bodyaches, congestion and low-grade fevers at home for last 2 days was started on him a flu by primary care physician at that time. Since then worsening symptoms including some cough and tonight difficulty breathing. States she's having trouble catching her breath lasting a few hours without known aggravating or alleviating factors. She is also complaining of feeling dehydrated with decreased appetite states nothing seems to taste right.  Is taking Biaxin.  History reviewed. No pertinent past medical history. History reviewed. No pertinent past surgical history. No family history on file. History  Substance Use Topics  . Smoking status: Current Every Day Smoker  . Smokeless tobacco: Not on file  . Alcohol Use: Yes   OB History   Grav Para Term Preterm Abortions TAB SAB Ect Mult Living                 Review of Systems    Allergies  Clarithromycin; Codeine; Resinol; and Sulfa antibiotics  Home Medications   Current Outpatient Rx  Name  Route  Sig  Dispense  Refill  . aspirin EC 81 MG tablet   Oral   Take 81 mg by mouth daily.         . Calcium Carbonate-Vitamin D (CALCIUM + D PO)   Oral   Take 1 tablet by mouth daily.         . Cholecalciferol (VITAMIN D) 2000 UNITS CAPS   Oral   Take 1 capsule by mouth daily.         Marland Kitchen gabapentin (NEURONTIN) 400 MG capsule   Oral   Take 400 mg by mouth 3 (three) times daily.         . Multiple Vitamin (MULTIVITAMIN WITH MINERALS) TABS tablet   Oral   Take 1 tablet by mouth daily.         . naproxen (NAPROSYN) 500 MG tablet   Oral   Take 500 mg by mouth 2 (two) times daily with a meal.         . Oxycodone HCl 10 MG  TABS   Oral   Take 20 mg by mouth every 4 (four) hours as needed. For pain         . potassium gluconate (HM POTASSIUM) 595 MG TABS tablet   Oral   Take 595 mg by mouth daily.         . promethazine (PHENERGAN) 25 MG tablet   Oral   Take 25 mg by mouth every 6 (six) hours as needed for nausea.          Marland Kitchen senna (SENOKOT) 8.6 MG TABS   Oral   Take 1 tablet by mouth daily as needed. For constipation         . TAMIFLU 75 MG capsule   Oral   Take 75 mg by mouth 2 (two) times daily. For flu symptoms beginning 09/01/13         . clarithromycin (BIAXIN) 250 MG tablet   Oral   Take 250 mg by mouth 2 (two) times daily.           BP 141/120  Pulse 109  Temp(Src) 97.6 F (36.4 C) (Oral)  Resp 26  Wt 133 lb 7  oz (60.527 kg)  SpO2 99% Physical Exam  Nursing note and vitals reviewed. Constitutional: She is oriented to person, place, and time. She appears well-developed and well-nourished.  HENT:  Head: Normocephalic and atraumatic.  Mildly dry mm  Eyes: EOM are normal. Pupils are equal, round, and reactive to light.  Neck: Neck supple.  Cardiovascular: Normal heart sounds and intact distal pulses.   Tachycardia 109  Pulmonary/Chest: Effort normal and breath sounds normal. No respiratory distress. She exhibits no tenderness.  Mild intermittent dry cough  Abdominal: Soft. There is no tenderness.  Musculoskeletal: Normal range of motion. She exhibits no edema and no tenderness.  Neurological: She is alert and oriented to person, place, and time. No cranial nerve deficit.  Skin: Skin is warm and dry. No rash noted.    ED Course  Procedures (including critical care time) Labs Review Labs Reviewed  CBC - Abnormal; Notable for the following:    Hemoglobin 15.7 (*)    All other components within normal limits  PRO B NATRIURETIC PEPTIDE - Abnormal; Notable for the following:    Pro B Natriuretic peptide (BNP) 237.7 (*)    All other components within normal limits  I-STAT  CHEM 8, ED - Abnormal; Notable for the following:    Glucose, Bld 112 (*)    Hemoglobin 16.0 (*)    HCT 47.0 (*)    All other components within normal limits  D-DIMER, QUANTITATIVE  I-STAT TROPOININ, ED   Imaging Review Dg Chest 2 View  09/05/2013   CLINICAL DATA:  Influenza.  Smoker.  EXAM: CHEST  2 VIEW  COMPARISON:  CT T SPINE W/CM dated 11/15/2011; DG RIBS UNILATERAL W/CHEST*L* dated 11/24/2010  FINDINGS: The heart size and mediastinal contours are within normal limits. Both lungs are clear. The visualized skeletal structures are unremarkable. Mild scoliosis. Previous lumbar instrumentation. No rib lesions. Developing severe disc space narrowing L1-L2.  IMPRESSION: No active cardiopulmonary disease.  Developing severe disc space narrowing L1-L2.   Electronically Signed   By: Rolla Flatten M.D.   On: 09/05/2013 02:40    IV fluids provided. Tylenol provided.  6:35 AM is drinking water and feeling much better and feels comfortable with plan discharge home. No longer having any dyspnea or significant coughing in the ER.  Patient will continue Tamiflu as prescribed and followup with her doctor on Monday for recheck. Return precautions verbalized as understood and is stable and appropriate for discharge at this time. She is requesting something to help her sleep and prescription for Vistaril provided.  MDM   Diagnosis: Influenza  Evaluated with labs and imaging reviewed as above. Symptomatically improved with IV fluids and Tylenol. Serial evaluations. Vital signs and nursing notes reviewed and considered.    Teressa Lower, MD 09/05/13 0932  Teressa Lower, MD 09/16/13 1006

## 2013-09-05 NOTE — Discharge Instructions (Signed)

## 2013-09-05 NOTE — ED Notes (Signed)
Walked pt. Sats dropped from 100%-93%. Went back up to 100% once she got back into the bed

## 2013-09-05 NOTE — ED Notes (Signed)
The pt was diagnosed with the flu in her doctors office on Tuesday.  She is not getting any better.  She cannot sleep and she feels like she cannot breathe.  At present she is hyperventilating.  .  She feels like she is dehydrated

## 2013-09-05 NOTE — ED Notes (Signed)
MD at bedside. 

## 2013-09-05 NOTE — ED Notes (Signed)
Pt states she is unable to walk at this time. She stated "I feel too weak".

## 2013-09-15 DIAGNOSIS — M545 Low back pain, unspecified: Secondary | ICD-10-CM | POA: Diagnosis not present

## 2013-09-15 DIAGNOSIS — IMO0002 Reserved for concepts with insufficient information to code with codable children: Secondary | ICD-10-CM | POA: Diagnosis not present

## 2013-11-05 DIAGNOSIS — M899 Disorder of bone, unspecified: Secondary | ICD-10-CM | POA: Diagnosis not present

## 2013-11-05 DIAGNOSIS — Z79899 Other long term (current) drug therapy: Secondary | ICD-10-CM | POA: Diagnosis not present

## 2013-11-05 DIAGNOSIS — F17200 Nicotine dependence, unspecified, uncomplicated: Secondary | ICD-10-CM | POA: Diagnosis not present

## 2013-11-05 DIAGNOSIS — E7800 Pure hypercholesterolemia, unspecified: Secondary | ICD-10-CM | POA: Diagnosis not present

## 2013-11-05 DIAGNOSIS — M949 Disorder of cartilage, unspecified: Secondary | ICD-10-CM | POA: Diagnosis not present

## 2013-11-05 DIAGNOSIS — M48061 Spinal stenosis, lumbar region without neurogenic claudication: Secondary | ICD-10-CM | POA: Diagnosis not present

## 2013-11-05 DIAGNOSIS — N183 Chronic kidney disease, stage 3 unspecified: Secondary | ICD-10-CM | POA: Diagnosis not present

## 2013-11-05 DIAGNOSIS — Z0000 Encounter for general adult medical examination without abnormal findings: Secondary | ICD-10-CM | POA: Diagnosis not present

## 2013-12-07 DIAGNOSIS — M545 Low back pain, unspecified: Secondary | ICD-10-CM | POA: Diagnosis not present

## 2013-12-07 DIAGNOSIS — IMO0002 Reserved for concepts with insufficient information to code with codable children: Secondary | ICD-10-CM | POA: Diagnosis not present

## 2014-01-12 DIAGNOSIS — M545 Low back pain, unspecified: Secondary | ICD-10-CM | POA: Diagnosis not present

## 2014-01-12 DIAGNOSIS — IMO0002 Reserved for concepts with insufficient information to code with codable children: Secondary | ICD-10-CM | POA: Diagnosis not present

## 2014-01-19 ENCOUNTER — Other Ambulatory Visit: Payer: Self-pay

## 2014-01-19 DIAGNOSIS — Z1231 Encounter for screening mammogram for malignant neoplasm of breast: Secondary | ICD-10-CM

## 2014-02-26 ENCOUNTER — Ambulatory Visit
Admission: RE | Admit: 2014-02-26 | Discharge: 2014-02-26 | Disposition: A | Discharge status: Home / Self Care | Payer: Medicare Other | Source: Ambulatory Visit

## 2014-02-26 DIAGNOSIS — Z1231 Encounter for screening mammogram for malignant neoplasm of breast: Secondary | ICD-10-CM | POA: Diagnosis not present

## 2014-03-05 DIAGNOSIS — H43819 Vitreous degeneration, unspecified eye: Secondary | ICD-10-CM | POA: Diagnosis not present

## 2014-04-07 DIAGNOSIS — M5416 Radiculopathy, lumbar region: Secondary | ICD-10-CM | POA: Diagnosis not present

## 2014-04-07 DIAGNOSIS — M545 Low back pain: Secondary | ICD-10-CM | POA: Diagnosis not present

## 2014-04-12 DIAGNOSIS — M5416 Radiculopathy, lumbar region: Secondary | ICD-10-CM | POA: Diagnosis not present

## 2014-04-12 DIAGNOSIS — M961 Postlaminectomy syndrome, not elsewhere classified: Secondary | ICD-10-CM | POA: Diagnosis not present

## 2014-04-12 DIAGNOSIS — M5417 Radiculopathy, lumbosacral region: Secondary | ICD-10-CM | POA: Diagnosis not present

## 2014-05-11 DIAGNOSIS — H25813 Combined forms of age-related cataract, bilateral: Secondary | ICD-10-CM | POA: Diagnosis not present

## 2014-05-18 DIAGNOSIS — M545 Low back pain: Secondary | ICD-10-CM | POA: Diagnosis not present

## 2014-05-18 DIAGNOSIS — Z6825 Body mass index (BMI) 25.0-25.9, adult: Secondary | ICD-10-CM | POA: Diagnosis not present

## 2014-05-18 DIAGNOSIS — M961 Postlaminectomy syndrome, not elsewhere classified: Secondary | ICD-10-CM | POA: Diagnosis not present

## 2014-05-18 DIAGNOSIS — M5416 Radiculopathy, lumbar region: Secondary | ICD-10-CM | POA: Diagnosis not present

## 2014-05-21 DIAGNOSIS — M722 Plantar fascial fibromatosis: Secondary | ICD-10-CM | POA: Diagnosis not present

## 2014-05-21 DIAGNOSIS — M2140 Flat foot [pes planus] (acquired), unspecified foot: Secondary | ICD-10-CM | POA: Diagnosis not present

## 2014-05-21 DIAGNOSIS — M76829 Posterior tibial tendinitis, unspecified leg: Secondary | ICD-10-CM | POA: Diagnosis not present

## 2014-05-24 ENCOUNTER — Other Ambulatory Visit: Payer: Self-pay | Admitting: Neurosurgery

## 2014-05-24 DIAGNOSIS — M545 Low back pain: Principal | ICD-10-CM

## 2014-05-24 DIAGNOSIS — G8929 Other chronic pain: Secondary | ICD-10-CM

## 2014-07-09 ENCOUNTER — Ambulatory Visit
Admission: RE | Admit: 2014-07-09 | Discharge: 2014-07-09 | Disposition: A | Discharge status: Home / Self Care | Payer: Medicare Other | Source: Ambulatory Visit | Attending: Neurosurgery | Admitting: Neurosurgery

## 2014-07-09 DIAGNOSIS — G8929 Other chronic pain: Secondary | ICD-10-CM

## 2014-07-09 DIAGNOSIS — M545 Low back pain, unspecified: Secondary | ICD-10-CM

## 2014-07-09 DIAGNOSIS — M5136 Other intervertebral disc degeneration, lumbar region: Secondary | ICD-10-CM | POA: Diagnosis not present

## 2014-07-09 DIAGNOSIS — M5126 Other intervertebral disc displacement, lumbar region: Secondary | ICD-10-CM | POA: Diagnosis not present

## 2014-07-09 DIAGNOSIS — M4186 Other forms of scoliosis, lumbar region: Secondary | ICD-10-CM | POA: Diagnosis not present

## 2014-07-09 MED ORDER — IOHEXOL 180 MG/ML  SOLN
15.0000 mL | Freq: Once | INTRAMUSCULAR | Status: AC | PRN
  Administered 2014-07-09: 15 mL via INTRATHECAL

## 2014-07-09 MED ORDER — DIAZEPAM 5 MG PO TABS
5.0000 mg | ORAL_TABLET | Freq: Once | ORAL | Status: AC
  Administered 2014-07-09: 5 mg via ORAL

## 2014-07-09 NOTE — Discharge Instructions (Signed)

## 2014-07-12 DIAGNOSIS — M5416 Radiculopathy, lumbar region: Secondary | ICD-10-CM | POA: Diagnosis not present

## 2014-07-12 DIAGNOSIS — M961 Postlaminectomy syndrome, not elsewhere classified: Secondary | ICD-10-CM | POA: Diagnosis not present

## 2014-07-12 DIAGNOSIS — M545 Low back pain: Secondary | ICD-10-CM | POA: Diagnosis not present

## 2014-07-12 DIAGNOSIS — Z6825 Body mass index (BMI) 25.0-25.9, adult: Secondary | ICD-10-CM | POA: Diagnosis not present

## 2014-07-28 DIAGNOSIS — S32009K Unspecified fracture of unspecified lumbar vertebra, subsequent encounter for fracture with nonunion: Secondary | ICD-10-CM | POA: Diagnosis not present

## 2014-07-28 DIAGNOSIS — M4806 Spinal stenosis, lumbar region: Secondary | ICD-10-CM | POA: Diagnosis not present

## 2014-07-28 DIAGNOSIS — M961 Postlaminectomy syndrome, not elsewhere classified: Secondary | ICD-10-CM | POA: Diagnosis not present

## 2014-07-28 DIAGNOSIS — M4120 Other idiopathic scoliosis, site unspecified: Secondary | ICD-10-CM | POA: Diagnosis not present

## 2014-07-28 DIAGNOSIS — M5416 Radiculopathy, lumbar region: Secondary | ICD-10-CM | POA: Diagnosis not present

## 2014-07-28 DIAGNOSIS — Z6825 Body mass index (BMI) 25.0-25.9, adult: Secondary | ICD-10-CM | POA: Diagnosis not present

## 2014-08-13 ENCOUNTER — Other Ambulatory Visit: Payer: Self-pay | Admitting: Neurosurgery

## 2014-08-30 DIAGNOSIS — M4806 Spinal stenosis, lumbar region: Secondary | ICD-10-CM | POA: Diagnosis not present

## 2014-08-30 DIAGNOSIS — S32009K Unspecified fracture of unspecified lumbar vertebra, subsequent encounter for fracture with nonunion: Secondary | ICD-10-CM | POA: Diagnosis not present

## 2014-08-30 DIAGNOSIS — M4126 Other idiopathic scoliosis, lumbar region: Secondary | ICD-10-CM | POA: Diagnosis not present

## 2014-08-30 DIAGNOSIS — M4316 Spondylolisthesis, lumbar region: Secondary | ICD-10-CM | POA: Diagnosis not present

## 2014-08-30 DIAGNOSIS — M5416 Radiculopathy, lumbar region: Secondary | ICD-10-CM | POA: Diagnosis not present

## 2014-08-30 DIAGNOSIS — M545 Low back pain: Secondary | ICD-10-CM | POA: Diagnosis not present

## 2014-08-30 DIAGNOSIS — M961 Postlaminectomy syndrome, not elsewhere classified: Secondary | ICD-10-CM | POA: Diagnosis not present

## 2014-08-30 DIAGNOSIS — Z6825 Body mass index (BMI) 25.0-25.9, adult: Secondary | ICD-10-CM | POA: Diagnosis not present

## 2014-09-30 ENCOUNTER — Encounter (HOSPITAL_COMMUNITY): Payer: Self-pay

## 2014-09-30 ENCOUNTER — Ambulatory Visit (HOSPITAL_COMMUNITY)
Admission: RE | Admit: 2014-09-30 | Discharge: 2014-09-30 | Disposition: A | Discharge status: Home / Self Care | Payer: Medicare Other | Source: Ambulatory Visit | Attending: Anesthesiology | Admitting: Anesthesiology

## 2014-09-30 ENCOUNTER — Encounter (HOSPITAL_COMMUNITY)
Admission: RE | Admit: 2014-09-30 | Discharge: 2014-09-30 | Disposition: A | Discharge status: Home / Self Care | Payer: Medicare Other | Source: Ambulatory Visit | Attending: Neurosurgery | Admitting: Neurosurgery

## 2014-09-30 DIAGNOSIS — Z01818 Encounter for other preprocedural examination: Secondary | ICD-10-CM | POA: Insufficient documentation

## 2014-09-30 DIAGNOSIS — F17200 Nicotine dependence, unspecified, uncomplicated: Secondary | ICD-10-CM

## 2014-09-30 HISTORY — DX: Other chronic pain: G89.29

## 2014-09-30 HISTORY — DX: Weakness: R53.1

## 2014-09-30 HISTORY — DX: Other complications of anesthesia, initial encounter: T88.59XA

## 2014-09-30 HISTORY — DX: Headache, unspecified: R51.9

## 2014-09-30 HISTORY — DX: Urgency of urination: R39.15

## 2014-09-30 HISTORY — DX: Other specified disorders of bone density and structure, unspecified site: M85.80

## 2014-09-30 HISTORY — DX: Personal history of other diseases of the respiratory system: Z87.09

## 2014-09-30 HISTORY — DX: Dorsalgia, unspecified: M54.9

## 2014-09-30 HISTORY — DX: Adverse effect of unspecified anesthetic, initial encounter: T41.45XA

## 2014-09-30 HISTORY — DX: Personal history of Methicillin resistant Staphylococcus aureus infection: Z86.14

## 2014-09-30 HISTORY — DX: Unspecified osteoarthritis, unspecified site: M19.90

## 2014-09-30 HISTORY — DX: Acute embolism and thrombosis of unspecified deep veins of unspecified lower extremity: I82.409

## 2014-09-30 HISTORY — DX: Headache: R51

## 2014-09-30 HISTORY — DX: Unspecified cataract: H26.9

## 2014-09-30 LAB — BASIC METABOLIC PANEL
Anion gap: 9 (ref 5–15)
BUN: 16 mg/dL (ref 6–23)
CO2: 28 mmol/L (ref 19–32)
Calcium: 9.8 mg/dL (ref 8.4–10.5)
Chloride: 104 mmol/L (ref 96–112)
Creatinine, Ser: 1.12 mg/dL — ABNORMAL HIGH (ref 0.50–1.10)
GFR calc Af Amer: 56 mL/min — ABNORMAL LOW (ref >90)
GFR calc non Af Amer: 48 mL/min — ABNORMAL LOW (ref >90)
GLUCOSE: 90 mg/dL (ref 70–99)
POTASSIUM: 4.7 mmol/L (ref 3.5–5.1)
Sodium: 141 mmol/L (ref 135–145)

## 2014-09-30 LAB — CBC
HCT: 38.7 % (ref 36.0–46.0)
Hemoglobin: 12.7 g/dL (ref 12.0–15.0)
MCH: 30.3 pg (ref 26.0–34.0)
MCHC: 32.8 g/dL (ref 30.0–36.0)
MCV: 92.4 fL (ref 78.0–100.0)
PLATELETS: 222 10*3/uL (ref 150–400)
RBC: 4.19 MIL/uL (ref 3.87–5.11)
RDW: 13.2 % (ref 11.5–15.5)
WBC: 8.4 10*3/uL (ref 4.0–10.5)

## 2014-09-30 LAB — TYPE AND SCREEN
ABO/RH(D): O POS
Antibody Screen: NEGATIVE

## 2014-09-30 LAB — SURGICAL PCR SCREEN
MRSA, PCR: POSITIVE — AB
Staphylococcus aureus: POSITIVE — AB

## 2014-09-30 NOTE — Progress Notes (Addendum)
Dr.Sharon Stephanie Acre is MEdical Md  Denies ever having an Echo  Denies ever having a Stress test  Denies ever having a Heart cath  Denies EKG or CXR  Pt doesn't have a cardiologist

## 2014-09-30 NOTE — Progress Notes (Signed)
Mupirocin Ointment Rx called into [facility] on [address] for positive PCR of MRSA and Staph. Pt notified and voiced understanding.

## 2014-09-30 NOTE — Pre-Procedure Instructions (Signed)
KASEY HANSELL  09/30/2014   Your procedure is scheduled on:  Thurs, April 7 @ 7:30 AM  Report to [facility]  at 5:30 AM.  Call this number if you have problems the morning of surgery: [phone]   Remember:   Do not eat food or drink liquids after midnight.   Take these medicines the morning of surgery with A SIP OF WATER: Gabapentin(Neurontin),Pain Pill(if needed),and Phenergan(Promethazine-if needed)              Stop taking your Naproxen and Aspirin. No Goody's,BC's,Ibuprofen,Fish Oil,or any Herbal Medications.   Do not wear jewelry, make-up or nail polish.  Do not wear lotions, powders, or perfumes. You may wear deodorant.  Do not shave 48 hours prior to surgery.   Do not bring valuables to the hospital.  [facility] is not responsible                  for any belongings or valuables.               Contacts, dentures or bridgework may not be worn into surgery.  Leave suitcase in the car. After surgery it may be brought to your room.  For patients admitted to the hospital, discharge time is determined by your                treatment team.                 Special Instructions:   - Preparing for Surgery  Before surgery, you can play an important role.  Because skin is not sterile, your skin needs to be as free of germs as possible.  You can reduce the number of germs on you skin by washing with CHG (chlorahexidine gluconate) soap before surgery.  CHG is an antiseptic cleaner which kills germs and bonds with the skin to continue killing germs even after washing.  Please DO NOT use if you have an allergy to CHG or antibacterial soaps.  If your skin becomes reddened/irritated stop using the CHG and inform your nurse when you arrive at Short Stay.  Do not shave (including legs and underarms) for at least 48 hours prior to the first CHG shower.  You may shave your face.  Please follow these instructions carefully:   1.  Shower with CHG Soap the night before  surgery and the                                morning of Surgery.  2.  If you choose to wash your hair, wash your hair first as usual with your       normal shampoo.  3.  After you shampoo, rinse your hair and body thoroughly to remove the                      Shampoo.  4.  Use CHG as you would any other liquid soap.  You can apply chg directly       to the skin and wash gently with scrungie or a clean washcloth.  5.  Apply the CHG Soap to your body ONLY FROM THE NECK DOWN.        Do not use on open wounds or open sores.  Avoid contact with your eyes,       ears, mouth and genitals (private parts).  Wash genitals (private parts)  with your normal soap.  6.  Wash thoroughly, paying special attention to the area where your surgery        will be performed.  7.  Thoroughly rinse your body with warm water from the neck down.  8.  DO NOT shower/wash with your normal soap after using and rinsing off       the CHG Soap.  9.  Pat yourself dry with a clean towel.            10.  Wear clean pajamas.            11.  Place clean sheets on your bed the night of your first shower and do not        sleep with pets.  Day of Surgery  Do not apply any lotions/deoderants the morning of surgery.  Please wear clean clothes to the hospital/surgery center.    Please read over the following fact sheets that you were given: Pain Booklet, Coughing and Deep Breathing, Blood Transfusion Information, MRSA Information and Surgical Site Infection Prevention

## 2014-10-06 MED ORDER — CEFAZOLIN SODIUM-DEXTROSE 2-3 GM-% IV SOLR
2.0000 g | INTRAVENOUS | Status: AC
  Administered 2014-10-07 (×2): 2 g via INTRAVENOUS
  Filled 2014-10-06: qty 50

## 2014-10-06 NOTE — Anesthesia Preprocedure Evaluation (Signed)
Anesthesia Evaluation  Patient identified by MRN, date of birth, ID band Patient awake    Reviewed: Allergy & Precautions, NPO status , Patient's Chart, lab work & pertinent test results  Airway Mallampati: II  TM Distance: >3 FB Neck ROM: Full    Dental no notable dental hx.    Pulmonary Current Smoker,  breath sounds clear to auscultation  Pulmonary exam normal       Cardiovascular negative cardio ROS  Rhythm:Regular Rate:Normal     Neuro/Psych  Headaches, negative psych ROS   GI/Hepatic negative GI ROS, Neg liver ROS,   Endo/Other  negative endocrine ROS  Renal/GU negative Renal ROS     Musculoskeletal  (+) Arthritis -,   Abdominal   Peds  Hematology negative hematology ROS (+)   Anesthesia Other Findings   Reproductive/Obstetrics negative OB ROS                             Anesthesia Physical Anesthesia Plan  ASA: III  Anesthesia Plan: General   Post-op Pain Management:    Induction: Intravenous  Airway Management Planned: Oral ETT  Additional Equipment:   Intra-op Plan:   Post-operative Plan: Extubation in OR  Informed Consent: I have reviewed the patients History and Physical, chart, labs and discussed the procedure including the risks, benefits and alternatives for the proposed anesthesia with the patient or authorized representative who has indicated his/her understanding and acceptance.   Dental advisory given  Plan Discussed with: CRNA  Anesthesia Plan Comments:         Anesthesia Quick Evaluation

## 2014-10-07 ENCOUNTER — Encounter (HOSPITAL_COMMUNITY): Payer: Self-pay | Admitting: *Deleted

## 2014-10-07 ENCOUNTER — Inpatient Hospital Stay (HOSPITAL_COMMUNITY): Payer: Medicare Other

## 2014-10-07 ENCOUNTER — Inpatient Hospital Stay (HOSPITAL_COMMUNITY): Payer: Medicare Other | Admitting: Anesthesiology

## 2014-10-07 ENCOUNTER — Inpatient Hospital Stay (HOSPITAL_COMMUNITY)
Admission: RE | Admit: 2014-10-07 | Discharge: 2014-10-12 | DRG: 460 | Disposition: A | Discharge status: Skilled Nursing Facility | Payer: Medicare Other | Source: Ambulatory Visit | Attending: Neurosurgery | Admitting: Neurosurgery

## 2014-10-07 ENCOUNTER — Encounter (HOSPITAL_COMMUNITY)
Admission: RE | Disposition: A | Discharge status: Skilled Nursing Facility | Payer: No Typology Code available for payment source | Source: Ambulatory Visit | Attending: Neurosurgery

## 2014-10-07 DIAGNOSIS — M5416 Radiculopathy, lumbar region: Secondary | ICD-10-CM | POA: Diagnosis present

## 2014-10-07 DIAGNOSIS — M961 Postlaminectomy syndrome, not elsewhere classified: Secondary | ICD-10-CM | POA: Diagnosis present

## 2014-10-07 DIAGNOSIS — M4155 Other secondary scoliosis, thoracolumbar region: Secondary | ICD-10-CM | POA: Diagnosis present

## 2014-10-07 DIAGNOSIS — M6281 Muscle weakness (generalized): Secondary | ICD-10-CM | POA: Diagnosis not present

## 2014-10-07 DIAGNOSIS — G4700 Insomnia, unspecified: Secondary | ICD-10-CM | POA: Diagnosis not present

## 2014-10-07 DIAGNOSIS — Z7982 Long term (current) use of aspirin: Secondary | ICD-10-CM

## 2014-10-07 DIAGNOSIS — R531 Weakness: Secondary | ICD-10-CM | POA: Diagnosis not present

## 2014-10-07 DIAGNOSIS — R2681 Unsteadiness on feet: Secondary | ICD-10-CM | POA: Diagnosis not present

## 2014-10-07 DIAGNOSIS — M4806 Spinal stenosis, lumbar region: Principal | ICD-10-CM | POA: Diagnosis present

## 2014-10-07 DIAGNOSIS — M4316 Spondylolisthesis, lumbar region: Secondary | ICD-10-CM | POA: Diagnosis present

## 2014-10-07 DIAGNOSIS — K5900 Constipation, unspecified: Secondary | ICD-10-CM | POA: Diagnosis not present

## 2014-10-07 DIAGNOSIS — G8929 Other chronic pain: Secondary | ICD-10-CM | POA: Diagnosis not present

## 2014-10-07 DIAGNOSIS — M963 Postlaminectomy kyphosis: Secondary | ICD-10-CM | POA: Diagnosis not present

## 2014-10-07 DIAGNOSIS — M960 Pseudarthrosis after fusion or arthrodesis: Secondary | ICD-10-CM | POA: Diagnosis present

## 2014-10-07 DIAGNOSIS — M4120 Other idiopathic scoliosis, site unspecified: Secondary | ICD-10-CM | POA: Diagnosis not present

## 2014-10-07 DIAGNOSIS — M549 Dorsalgia, unspecified: Secondary | ICD-10-CM | POA: Diagnosis not present

## 2014-10-07 DIAGNOSIS — R278 Other lack of coordination: Secondary | ICD-10-CM | POA: Diagnosis not present

## 2014-10-07 DIAGNOSIS — Z720 Tobacco use: Secondary | ICD-10-CM | POA: Diagnosis not present

## 2014-10-07 DIAGNOSIS — M4800 Spinal stenosis, site unspecified: Secondary | ICD-10-CM | POA: Diagnosis not present

## 2014-10-07 DIAGNOSIS — Z4789 Encounter for other orthopedic aftercare: Secondary | ICD-10-CM | POA: Diagnosis not present

## 2014-10-07 DIAGNOSIS — G629 Polyneuropathy, unspecified: Secondary | ICD-10-CM | POA: Diagnosis not present

## 2014-10-07 DIAGNOSIS — M545 Low back pain: Secondary | ICD-10-CM | POA: Diagnosis not present

## 2014-10-07 DIAGNOSIS — E876 Hypokalemia: Secondary | ICD-10-CM | POA: Diagnosis not present

## 2014-10-07 DIAGNOSIS — R600 Localized edema: Secondary | ICD-10-CM | POA: Diagnosis not present

## 2014-10-07 SURGERY — POSTERIOR LUMBAR FUSION 1 LEVEL
Anesthesia: General | Site: Back

## 2014-10-07 MED ORDER — MIDAZOLAM HCL 5 MG/5ML IJ SOLN
INTRAMUSCULAR | Status: DC | PRN
  Administered 2014-10-07 (×2): 1 mg via INTRAVENOUS

## 2014-10-07 MED ORDER — PANTOPRAZOLE SODIUM 40 MG IV SOLR
40.0000 mg | Freq: Every day | INTRAVENOUS | Status: DC
  Administered 2014-10-07: 40 mg via INTRAVENOUS
  Filled 2014-10-07: qty 40

## 2014-10-07 MED ORDER — EPHEDRINE SULFATE 50 MG/ML IJ SOLN
INTRAMUSCULAR | Status: AC
  Filled 2014-10-07: qty 1

## 2014-10-07 MED ORDER — PROPOFOL 10 MG/ML IV BOLUS
INTRAVENOUS | Status: DC | PRN
  Administered 2014-10-07: 50 mg via INTRAVENOUS
  Administered 2014-10-07: 150 mg via INTRAVENOUS

## 2014-10-07 MED ORDER — PROPOFOL 10 MG/ML IV BOLUS
INTRAVENOUS | Status: AC
  Filled 2014-10-07: qty 20

## 2014-10-07 MED ORDER — STERILE WATER FOR INJECTION IJ SOLN
INTRAMUSCULAR | Status: AC
  Filled 2014-10-07: qty 10

## 2014-10-07 MED ORDER — VITAMIN D 50 MCG (2000 UT) PO CAPS: 1.0000 | ORAL_CAPSULE | Freq: Every day | ORAL | Status: DC

## 2014-10-07 MED ORDER — ARTIFICIAL TEARS OP OINT
TOPICAL_OINTMENT | OPHTHALMIC | Status: AC
  Filled 2014-10-07: qty 3.5

## 2014-10-07 MED ORDER — SODIUM CHLORIDE 0.9 % IJ SOLN
INTRAMUSCULAR | Status: AC
  Filled 2014-10-07: qty 10

## 2014-10-07 MED ORDER — PHENYLEPHRINE HCL 10 MG/ML IJ SOLN
10.0000 mg | INTRAVENOUS | Status: DC | PRN
  Administered 2014-10-07: 20 ug/min via INTRAVENOUS

## 2014-10-07 MED ORDER — ROCURONIUM BROMIDE 50 MG/5ML IV SOLN
INTRAVENOUS | Status: AC
  Filled 2014-10-07: qty 1

## 2014-10-07 MED ORDER — CALCIUM CARBONATE-VITAMIN D 250-125 MG-UNIT PO TABS: 2.0000 | ORAL_TABLET | Freq: Two times a day (BID) | ORAL | Status: DC

## 2014-10-07 MED ORDER — ONDANSETRON HCL 4 MG/2ML IJ SOLN: 4.0000 mg | INTRAMUSCULAR | Status: DC | PRN

## 2014-10-07 MED ORDER — BUPIVACAINE HCL (PF) 0.5 % IJ SOLN
INTRAMUSCULAR | Status: DC | PRN
  Administered 2014-10-07: 10 mL

## 2014-10-07 MED ORDER — ASPIRIN 325 MG PO TABS
325.0000 mg | ORAL_TABLET | Freq: Every day | ORAL | Status: DC
  Administered 2014-10-07 – 2014-10-12 (×6): 325 mg via ORAL
  Filled 2014-10-07 (×6): qty 1

## 2014-10-07 MED ORDER — SODIUM CHLORIDE 0.9 % IJ SOLN
3.0000 mL | INTRAMUSCULAR | Status: DC | PRN
  Administered 2014-10-07 – 2014-10-08 (×2): 3 mL via INTRAVENOUS
  Filled 2014-10-07 (×2): qty 3

## 2014-10-07 MED ORDER — THROMBIN 20000 UNITS EX SOLR
CUTANEOUS | Status: DC | PRN
  Administered 2014-10-07: 09:00:00 via TOPICAL

## 2014-10-07 MED ORDER — HYDROXYZINE HCL 25 MG PO TABS
25.0000 mg | ORAL_TABLET | Freq: Every evening | ORAL | Status: DC | PRN
  Administered 2014-10-07 – 2014-10-12 (×3): 25 mg via ORAL
  Filled 2014-10-07 (×3): qty 1

## 2014-10-07 MED ORDER — OXYCODONE HCL 10 MG PO TABS
10.0000 mg | ORAL_TABLET | ORAL | Status: DC | PRN
  Administered 2014-10-07: 10 mg via ORAL

## 2014-10-07 MED ORDER — ADULT MULTIVITAMIN W/MINERALS CH
1.0000 | ORAL_TABLET | Freq: Every day | ORAL | Status: DC
  Administered 2014-10-08 – 2014-10-12 (×5): 1 via ORAL
  Filled 2014-10-07 (×5): qty 1

## 2014-10-07 MED ORDER — GLYCOPYRROLATE 0.2 MG/ML IJ SOLN
INTRAMUSCULAR | Status: AC
  Filled 2014-10-07: qty 3

## 2014-10-07 MED ORDER — VECURONIUM BROMIDE 10 MG IV SOLR
INTRAVENOUS | Status: AC
  Filled 2014-10-07: qty 10

## 2014-10-07 MED ORDER — HYDROMORPHONE HCL 1 MG/ML IJ SOLN
0.5000 mg | INTRAMUSCULAR | Status: DC | PRN
  Administered 2014-10-07: 1 mg via INTRAVENOUS
  Administered 2014-10-07 (×2): 0.5 mg via INTRAVENOUS
  Administered 2014-10-07 – 2014-10-11 (×29): 1 mg via INTRAVENOUS
  Filled 2014-10-07 (×30): qty 1

## 2014-10-07 MED ORDER — VITAMIN D3 25 MCG (1000 UNIT) PO TABS: 2000.0000 [IU] | ORAL_TABLET | Freq: Every day | ORAL | Status: DC

## 2014-10-07 MED ORDER — FENTANYL CITRATE 0.05 MG/ML IJ SOLN
INTRAMUSCULAR | Status: AC
  Filled 2014-10-07: qty 2

## 2014-10-07 MED ORDER — BUPIVACAINE LIPOSOME 1.3 % IJ SUSP
20.0000 mL | Freq: Once | INTRAMUSCULAR | Status: DC
  Filled 2014-10-07: qty 20

## 2014-10-07 MED ORDER — VANCOMYCIN HCL 1000 MG IV SOLR
INTRAVENOUS | Status: AC
  Filled 2014-10-07: qty 1000

## 2014-10-07 MED ORDER — MEPERIDINE HCL 25 MG/ML IJ SOLN: 6.2500 mg | INTRAMUSCULAR | Status: DC | PRN

## 2014-10-07 MED ORDER — 0.9 % SODIUM CHLORIDE (POUR BTL) OPTIME
TOPICAL | Status: DC | PRN
  Administered 2014-10-07 (×2): 1000 mL

## 2014-10-07 MED ORDER — OXYCODONE-ACETAMINOPHEN 5-325 MG PO TABS
1.0000 | ORAL_TABLET | ORAL | Status: DC | PRN
  Administered 2014-10-07 – 2014-10-10 (×4): 2 via ORAL
  Filled 2014-10-07 (×6): qty 2

## 2014-10-07 MED ORDER — HYDROMORPHONE HCL 1 MG/ML IJ SOLN
INTRAMUSCULAR | Status: DC | PRN
  Administered 2014-10-07 (×5): .2 mg via INTRAVENOUS

## 2014-10-07 MED ORDER — DOCUSATE SODIUM 100 MG PO CAPS
100.0000 mg | ORAL_CAPSULE | Freq: Two times a day (BID) | ORAL | Status: DC
  Administered 2014-10-07 – 2014-10-12 (×10): 100 mg via ORAL
  Filled 2014-10-07 (×10): qty 1

## 2014-10-07 MED ORDER — BUPIVACAINE LIPOSOME 1.3 % IJ SUSP
INTRAMUSCULAR | Status: DC | PRN
  Administered 2014-10-07: 20 mL

## 2014-10-07 MED ORDER — ACETAMINOPHEN 325 MG PO TABS: 650.0000 mg | ORAL_TABLET | ORAL | Status: DC | PRN

## 2014-10-07 MED ORDER — ROCURONIUM BROMIDE 100 MG/10ML IV SOLN
INTRAVENOUS | Status: DC | PRN
  Administered 2014-10-07: 10 mg via INTRAVENOUS
  Administered 2014-10-07: 40 mg via INTRAVENOUS

## 2014-10-07 MED ORDER — ACETAMINOPHEN 650 MG RE SUPP: 650.0000 mg | RECTAL | Status: DC | PRN

## 2014-10-07 MED ORDER — FENTANYL CITRATE 0.05 MG/ML IJ SOLN
INTRAMUSCULAR | Status: AC
  Filled 2014-10-07: qty 5

## 2014-10-07 MED ORDER — GLYCOPYRROLATE 0.2 MG/ML IJ SOLN
INTRAMUSCULAR | Status: AC
  Filled 2014-10-07: qty 1

## 2014-10-07 MED ORDER — NICOTINE 14 MG/24HR TD PT24
14.0000 mg | MEDICATED_PATCH | Freq: Every day | TRANSDERMAL | Status: DC
  Administered 2014-10-07 – 2014-10-12 (×6): 14 mg via TRANSDERMAL
  Filled 2014-10-07 (×6): qty 1

## 2014-10-07 MED ORDER — GABAPENTIN 400 MG PO CAPS
400.0000 mg | ORAL_CAPSULE | Freq: Three times a day (TID) | ORAL | Status: DC
  Administered 2014-10-07 – 2014-10-12 (×15): 400 mg via ORAL
  Filled 2014-10-07 (×15): qty 1

## 2014-10-07 MED ORDER — METHOCARBAMOL 500 MG PO TABS
500.0000 mg | ORAL_TABLET | Freq: Four times a day (QID) | ORAL | Status: DC | PRN
  Administered 2014-10-07 – 2014-10-11 (×8): 500 mg via ORAL
  Filled 2014-10-07 (×10): qty 1

## 2014-10-07 MED ORDER — CEFAZOLIN SODIUM 1-5 GM-% IV SOLN
1.0000 g | Freq: Three times a day (TID) | INTRAVENOUS | Status: AC
  Administered 2014-10-07 – 2014-10-08 (×2): 1 g via INTRAVENOUS
  Filled 2014-10-07 (×2): qty 50

## 2014-10-07 MED ORDER — PHENOL 1.4 % MT LIQD
1.0000 | OROMUCOSAL | Status: DC | PRN
  Administered 2014-10-07: 1 via OROMUCOSAL
  Filled 2014-10-07: qty 177

## 2014-10-07 MED ORDER — ONDANSETRON HCL 4 MG/2ML IJ SOLN
INTRAMUSCULAR | Status: AC
  Filled 2014-10-07: qty 2

## 2014-10-07 MED ORDER — HYDROMORPHONE HCL 1 MG/ML IJ SOLN
INTRAMUSCULAR | Status: AC
  Filled 2014-10-07: qty 1

## 2014-10-07 MED ORDER — GLYCOPYRROLATE 0.2 MG/ML IJ SOLN
INTRAMUSCULAR | Status: AC
  Filled 2014-10-07: qty 2

## 2014-10-07 MED ORDER — ALUM & MAG HYDROXIDE-SIMETH 200-200-20 MG/5ML PO SUSP: 30.0000 mL | Freq: Four times a day (QID) | ORAL | Status: DC | PRN

## 2014-10-07 MED ORDER — POLYETHYLENE GLYCOL 3350 17 G PO PACK: 17.0000 g | PACK | Freq: Every day | ORAL | Status: DC | PRN

## 2014-10-07 MED ORDER — VANCOMYCIN HCL 1000 MG IV SOLR
INTRAVENOUS | Status: DC | PRN
  Administered 2014-10-07: 1000 mg via TOPICAL

## 2014-10-07 MED ORDER — ZOLPIDEM TARTRATE 5 MG PO TABS
5.0000 mg | ORAL_TABLET | Freq: Every evening | ORAL | Status: DC | PRN
  Administered 2014-10-07 – 2014-10-11 (×3): 5 mg via ORAL
  Filled 2014-10-07 (×3): qty 1

## 2014-10-07 MED ORDER — OXYCODONE HCL 5 MG PO TABS
ORAL_TABLET | ORAL | Status: AC
  Filled 2014-10-07: qty 2

## 2014-10-07 MED ORDER — NEOSTIGMINE METHYLSULFATE 10 MG/10ML IV SOLN
INTRAVENOUS | Status: DC | PRN
  Administered 2014-10-07: 4 mg via INTRAVENOUS

## 2014-10-07 MED ORDER — VANCOMYCIN HCL IN DEXTROSE 1-5 GM/200ML-% IV SOLN
INTRAVENOUS | Status: AC
  Filled 2014-10-07: qty 200

## 2014-10-07 MED ORDER — ARTIFICIAL TEARS OP OINT
TOPICAL_OINTMENT | OPHTHALMIC | Status: DC | PRN
  Administered 2014-10-07: 1 via OPHTHALMIC

## 2014-10-07 MED ORDER — FENTANYL CITRATE 0.05 MG/ML IJ SOLN
25.0000 ug | INTRAMUSCULAR | Status: DC | PRN
  Administered 2014-10-07 (×3): 50 ug via INTRAVENOUS

## 2014-10-07 MED ORDER — MENTHOL 3 MG MT LOZG: 1.0000 | LOZENGE | OROMUCOSAL | Status: DC | PRN

## 2014-10-07 MED ORDER — LIDOCAINE HCL (CARDIAC) 20 MG/ML IV SOLN
INTRAVENOUS | Status: AC
  Filled 2014-10-07: qty 5

## 2014-10-07 MED ORDER — FLEET ENEMA 7-19 GM/118ML RE ENEM: 1.0000 | ENEMA | Freq: Once | RECTAL | Status: AC | PRN

## 2014-10-07 MED ORDER — PHENYLEPHRINE 40 MCG/ML (10ML) SYRINGE FOR IV PUSH (FOR BLOOD PRESSURE SUPPORT)
PREFILLED_SYRINGE | INTRAVENOUS | Status: AC
  Filled 2014-10-07: qty 10

## 2014-10-07 MED ORDER — MIDAZOLAM HCL 2 MG/2ML IJ SOLN
INTRAMUSCULAR | Status: AC
  Filled 2014-10-07: qty 2

## 2014-10-07 MED ORDER — ONDANSETRON HCL 4 MG/2ML IJ SOLN
INTRAMUSCULAR | Status: DC | PRN
  Administered 2014-10-07: 4 mg via INTRAVENOUS

## 2014-10-07 MED ORDER — GLYCOPYRROLATE 0.2 MG/ML IJ SOLN
INTRAMUSCULAR | Status: DC | PRN
  Administered 2014-10-07 (×2): 0.1 mg via INTRAVENOUS
  Administered 2014-10-07: 0.6 mg via INTRAVENOUS

## 2014-10-07 MED ORDER — BUPRENORPHINE 10 MCG/HR TD PTWK: 10.0000 ug | MEDICATED_PATCH | TRANSDERMAL | Status: DC

## 2014-10-07 MED ORDER — SUCCINYLCHOLINE CHLORIDE 20 MG/ML IJ SOLN
INTRAMUSCULAR | Status: AC
  Filled 2014-10-07: qty 1

## 2014-10-07 MED ORDER — FENTANYL CITRATE 0.05 MG/ML IJ SOLN
INTRAMUSCULAR | Status: DC | PRN
  Administered 2014-10-07 (×8): 50 ug via INTRAVENOUS
  Administered 2014-10-07: 100 ug via INTRAVENOUS

## 2014-10-07 MED ORDER — POTASSIUM GLUCONATE 595 (99 K) MG PO TABS: 595.0000 mg | ORAL_TABLET | Freq: Every day | ORAL | Status: DC

## 2014-10-07 MED ORDER — LACTATED RINGERS IV SOLN
INTRAVENOUS | Status: DC | PRN
  Administered 2014-10-07 (×3): via INTRAVENOUS

## 2014-10-07 MED ORDER — LIDOCAINE HCL (CARDIAC) 20 MG/ML IV SOLN
INTRAVENOUS | Status: DC | PRN
  Administered 2014-10-07: 60 mg via INTRAVENOUS

## 2014-10-07 MED ORDER — KCL IN DEXTROSE-NACL 20-5-0.45 MEQ/L-%-% IV SOLN
INTRAVENOUS | Status: DC
Start: 2014-10-07 — End: 2014-10-12
  Administered 2014-10-07 – 2014-10-09 (×2): via INTRAVENOUS
  Filled 2014-10-07 (×2): qty 1000

## 2014-10-07 MED ORDER — BISACODYL 10 MG RE SUPP
10.0000 mg | Freq: Every day | RECTAL | Status: DC | PRN
Start: 2014-10-07 — End: 2014-10-12
  Filled 2014-10-07: qty 1

## 2014-10-07 MED ORDER — VECURONIUM BROMIDE 10 MG IV SOLR
INTRAVENOUS | Status: DC | PRN
  Administered 2014-10-07: 1 mg via INTRAVENOUS
  Administered 2014-10-07: .5 mg via INTRAVENOUS
  Administered 2014-10-07: 1 mg via INTRAVENOUS
  Administered 2014-10-07: .5 mg via INTRAVENOUS
  Administered 2014-10-07 (×2): 1 mg via INTRAVENOUS

## 2014-10-07 MED ORDER — SODIUM CHLORIDE 0.9 % IJ SOLN
3.0000 mL | Freq: Two times a day (BID) | INTRAMUSCULAR | Status: DC
  Administered 2014-10-07 – 2014-10-08 (×2): 3 mL via INTRAVENOUS
  Administered 2014-10-08: 21:00:00 via INTRAVENOUS
  Administered 2014-10-09 – 2014-10-11 (×6): 3 mL via INTRAVENOUS

## 2014-10-07 MED ORDER — SODIUM CHLORIDE 0.9 % IV SOLN
250.0000 mL | INTRAVENOUS | Status: DC
  Administered 2014-10-08: 250 mL via INTRAVENOUS

## 2014-10-07 MED ORDER — HYDROCODONE-ACETAMINOPHEN 5-325 MG PO TABS
1.0000 | ORAL_TABLET | ORAL | Status: DC | PRN
  Administered 2014-10-08: 2 via ORAL
  Filled 2014-10-07: qty 2

## 2014-10-07 MED ORDER — PROMETHAZINE HCL 25 MG/ML IJ SOLN
6.2500 mg | INTRAMUSCULAR | Status: DC | PRN
Start: 2014-10-07 — End: 2014-10-07

## 2014-10-07 MED ORDER — CEFAZOLIN SODIUM-DEXTROSE 2-3 GM-% IV SOLR
INTRAVENOUS | Status: AC
  Filled 2014-10-07: qty 50

## 2014-10-07 MED ORDER — ACETAMINOPHEN 10 MG/ML IV SOLN
INTRAVENOUS | Status: AC
  Administered 2014-10-07: 1000 mg via INTRAVENOUS
  Filled 2014-10-07: qty 100

## 2014-10-07 MED ORDER — METHOCARBAMOL 1000 MG/10ML IJ SOLN
500.0000 mg | Freq: Four times a day (QID) | INTRAVENOUS | Status: DC | PRN
  Administered 2014-10-07: 500 mg via INTRAVENOUS
  Filled 2014-10-07 (×2): qty 5

## 2014-10-07 MED ORDER — VANCOMYCIN HCL 1000 MG IV SOLR
1000.0000 mg | INTRAVENOUS | Status: DC | PRN
  Administered 2014-10-07: 1000 mg via INTRAVENOUS

## 2014-10-07 MED ORDER — LIDOCAINE-EPINEPHRINE 1 %-1:100000 IJ SOLN
INTRAMUSCULAR | Status: DC | PRN
  Administered 2014-10-07: 10 mL

## 2014-10-07 SURGICAL SUPPLY — 91 items
BENZOIN TINCTURE PRP APPL 2/3 (GAUZE/BANDAGES/DRESSINGS) IMPLANT
BLADE CLIPPER SURG (BLADE) IMPLANT
BONE CANC CHIPS 20CC PCAN1/4 (Bone Implant) ×6 IMPLANT
BUR MATCHSTICK NEURO 3.0 LAGG (BURR) ×3 IMPLANT
BUR PRECISION FLUTE 5.0 (BURR) ×3 IMPLANT
CANISTER SUCT 3000ML PPV (MISCELLANEOUS) ×3 IMPLANT
CHIPS CANC BONE 20CC PCAN1/4 (Bone Implant) ×4 IMPLANT
CLIP ON DISPOSABLE REMOTE CONTROL ×3 IMPLANT
CONT SPEC 4OZ CLIKSEAL STRL BL (MISCELLANEOUS) ×9 IMPLANT
COVER BACK TABLE 60X90IN (DRAPES) ×12 IMPLANT
DECANTER SPIKE VIAL GLASS SM (MISCELLANEOUS) ×3 IMPLANT
DERMABOND ADHESIVE PROPEN (GAUZE/BANDAGES/DRESSINGS) ×4
DERMABOND ADVANCED .7 DNX6 (GAUZE/BANDAGES/DRESSINGS) ×8 IMPLANT
DIGITIZER BENDINI (MISCELLANEOUS) ×3 IMPLANT
DRAPE C-ARM 42X72 X-RAY (DRAPES) IMPLANT
DRAPE C-ARMOR (DRAPES) IMPLANT
DRAPE LAPAROTOMY 100X72X124 (DRAPES) ×3 IMPLANT
DRAPE POUCH INSTRU U-SHP 10X18 (DRAPES) ×3 IMPLANT
DRAPE SCAN PATIENT (DRAPES) ×6 IMPLANT
DRAPE SURG 17X23 STRL (DRAPES) ×3 IMPLANT
DRSG OPSITE POSTOP 4X10 (GAUZE/BANDAGES/DRESSINGS) ×3 IMPLANT
DRSG OPSITE POSTOP 4X8 (GAUZE/BANDAGES/DRESSINGS) ×3 IMPLANT
DRSG TELFA 3X8 NADH (GAUZE/BANDAGES/DRESSINGS) IMPLANT
DURAPREP 26ML APPLICATOR (WOUND CARE) ×3 IMPLANT
ELECT BLADE 4.0 EZ CLEAN MEGAD (MISCELLANEOUS) ×3
ELECT REM PT RETURN 9FT ADLT (ELECTROSURGICAL) ×3
ELECTRODE BLDE 4.0 EZ CLN MEGD (MISCELLANEOUS) ×2 IMPLANT
ELECTRODE REM PT RTRN 9FT ADLT (ELECTROSURGICAL) ×2 IMPLANT
EVACUATOR 1/8 PVC DRAIN (DRAIN) ×3 IMPLANT
GAUZE SPONGE 4X4 12PLY STRL (GAUZE/BANDAGES/DRESSINGS) IMPLANT
GAUZE SPONGE 4X4 16PLY XRAY LF (GAUZE/BANDAGES/DRESSINGS) ×6 IMPLANT
GLOVE BIO SURGEON STRL SZ8 (GLOVE) ×6 IMPLANT
GLOVE BIOGEL PI IND STRL 8 (GLOVE) ×8 IMPLANT
GLOVE BIOGEL PI IND STRL 8.5 (GLOVE) ×4 IMPLANT
GLOVE BIOGEL PI INDICATOR 8 (GLOVE) ×4
GLOVE BIOGEL PI INDICATOR 8.5 (GLOVE) ×2
GLOVE ECLIPSE 7.5 STRL STRAW (GLOVE) ×12 IMPLANT
GLOVE ECLIPSE 8.0 STRL XLNG CF (GLOVE) ×6 IMPLANT
GLOVE EXAM NITRILE LRG STRL (GLOVE) IMPLANT
GLOVE EXAM NITRILE MD LF STRL (GLOVE) IMPLANT
GLOVE EXAM NITRILE XL STR (GLOVE) IMPLANT
GLOVE EXAM NITRILE XS STR PU (GLOVE) IMPLANT
GOWN STRL REUS W/ TWL LRG LVL3 (GOWN DISPOSABLE) IMPLANT
GOWN STRL REUS W/ TWL XL LVL3 (GOWN DISPOSABLE) ×4 IMPLANT
GOWN STRL REUS W/TWL 2XL LVL3 (GOWN DISPOSABLE) ×12 IMPLANT
GOWN STRL REUS W/TWL LRG LVL3 (GOWN DISPOSABLE)
GOWN STRL REUS W/TWL XL LVL3 (GOWN DISPOSABLE) ×2
KIT BASIN OR (CUSTOM PROCEDURE TRAY) ×3 IMPLANT
KIT INFUSE MEDIUM (Orthopedic Implant) ×3 IMPLANT
KIT POSITION SURG JACKSON T1 (MISCELLANEOUS) ×3 IMPLANT
KIT ROOM TURNOVER OR (KITS) ×3 IMPLANT
LIQUID BAND (GAUZE/BANDAGES/DRESSINGS) IMPLANT
MARKER SPHERE PSV REFLC 13MM (MARKER) ×15 IMPLANT
MILL MEDIUM DISP (BLADE) IMPLANT
NEEDLE HYPO 21X1.5 SAFETY (NEEDLE) ×3 IMPLANT
NEEDLE HYPO 25X1 1.5 SAFETY (NEEDLE) ×3 IMPLANT
NEEDLE SPNL 18GX3.5 QUINCKE PK (NEEDLE) IMPLANT
NS IRRIG 1000ML POUR BTL (IV SOLUTION) ×9 IMPLANT
PACK FOAM VITOSS 10CC (Orthopedic Implant) ×6 IMPLANT
PACK LAMINECTOMY NEURO (CUSTOM PROCEDURE TRAY) ×3 IMPLANT
PAD ARMBOARD 7.5X6 YLW CONV (MISCELLANEOUS) ×9 IMPLANT
PATTIES SURGICAL .5 X.5 (GAUZE/BANDAGES/DRESSINGS) IMPLANT
PATTIES SURGICAL .5 X1 (DISPOSABLE) IMPLANT
PATTIES SURGICAL 1X1 (DISPOSABLE) IMPLANT
REMOTE CONTROL CLIP ON DISP (MISCELLANEOUS) ×3 IMPLANT
ROD RELINE 5.5X500MM STRAIGHT (Rod) ×6 IMPLANT
ROD RELINE O-H CON M 5.0/6.0MM (Rod) ×6 IMPLANT
SCREW LOCK RELINE 5.5 TULIP (Screw) ×60 IMPLANT
SCREW RELINE-O POLY 5.0X40 (Screw) ×12 IMPLANT
SCREW RELINE-O POLY 5.0X45MM (Screw) ×12 IMPLANT
SCREW RELINE-O POLY 5.5X45MM (Screw) ×12 IMPLANT
SCREW RELINE-O POLY 6.5X45 (Screw) ×18 IMPLANT
SCREW RELINE-O POLY 7.5X45 (Screw) ×6 IMPLANT
SCREW RELINE-O POLY 8.5C70MM (Screw) ×6 IMPLANT
SPONGE LAP 4X18 X RAY DECT (DISPOSABLE) IMPLANT
SPONGE SURGIFOAM ABS GEL 100 (HEMOSTASIS) ×3 IMPLANT
STAPLER SKIN PROX WIDE 3.9 (STAPLE) ×3 IMPLANT
STRIP CLOSURE SKIN 1/2X4 (GAUZE/BANDAGES/DRESSINGS) IMPLANT
SUT VIC AB 1 CT1 18XBRD ANBCTR (SUTURE) ×8 IMPLANT
SUT VIC AB 1 CT1 8-18 (SUTURE) ×4
SUT VIC AB 2-0 CT1 18 (SUTURE) ×6 IMPLANT
SUT VIC AB 3-0 SH 8-18 (SUTURE) ×12 IMPLANT
SYR 20CC LL (SYRINGE) ×3 IMPLANT
SYR 20ML ECCENTRIC (SYRINGE) ×3 IMPLANT
SYR 3ML LL SCALE MARK (SYRINGE) IMPLANT
SYR 5ML LL (SYRINGE) IMPLANT
TOWEL OR 17X24 6PK STRL BLUE (TOWEL DISPOSABLE) ×3 IMPLANT
TOWEL OR 17X26 10 PK STRL BLUE (TOWEL DISPOSABLE) ×3 IMPLANT
TRAP SPECIMEN MUCOUS 40CC (MISCELLANEOUS) ×3 IMPLANT
TRAY FOLEY CATH 14FRSI W/METER (CATHETERS) ×3 IMPLANT
WATER STERILE IRR 1000ML POUR (IV SOLUTION) ×3 IMPLANT

## 2014-10-07 NOTE — H&P (Signed)
Patient ID:   [id number] Patient: Sandra [name]  Date of Birth: [birth year] Visit Type: Office Visit   Date: 08/30/2014 02:00 PM Provider: Marchia Meiers. Vertell Limber MD   This 74 year old female presents for back pain.  History of Present Illness: 1.  back pain  I reviewed the patient's films with her and answered her questions.  She has had prior lumbar fusion L2 through S1 levels.  She has evidence of pseudoarthrosis at the L5-S1 level.  She also significant degeneration and spondylolisthesis with foraminal stenosis and stenosis at L1-2 level.  In light of the patient's progressive worsening I have recommended proceeding with decompression and fusion L1-2 level and extending fusion from T10 to the ilium.  We went over models and discussed treatment rationale and the reasons for proceeding with this treatment.  Answered the questions patient posed.  We fitted her for TLSO brace.  She was given a prescription for this.  Patient is decided to move her surgery up.  Her previous plan was to do this in the later summer or fall but she says the pain has worsened and wants to proceed with treatment.  This has now been scheduled for 10/07/14.      Medical/Surgical/Interim History Reviewed, no change.   PAST MEDICAL HISTORY, SURGICAL HISTORY, FAMILY HISTORY, SOCIAL HISTORY AND REVIEW OF SYSTEMS I have reviewed the patient's past medical, surgical, family and social history as well as the comprehensive review of systems as included on the [facility] history form dated 07/25/2014, which I have signed.  Family History: Reviewed, no changes.   Patient reports there is no relevant family history.   Social History: Tobacco use reviewed. Reviewed, no changes.     MEDICATIONS(added, continued or stopped this visit): Started Medication Directions Instruction Stopped   aspirin 81 mg chewable tablet chew 1 tablet by oral route  every day    07/12/2014 Butrans 10 mcg/hour  transdermal patch apply 1 patch by transdermal route  every 7 days     Calcium-D3  BUCCAL     08/24/2014 NEURONTIN 400 MG CAPSULE TAKE 1 CAPSULE BY MOUTH 3 TIMES DAILY    07/22/2014 oxycodone 10 mg tablet 1 PO Q 6 hrs prn (DNF 07/22/14)    08/21/2014 oxycodone 10 mg tablet 1 PO Q 6 hrs prn (DNF 08/21/14)     potassium gluconate 595 mg (99 mg) tablet      senna 8.6 mg capsule take 1 capsule by oral route  every day at bedtime     Vitamin D  BUCCAL        ALLERGIES: Ingredient Reaction Medication Name Comment  CALAMINE  Resinol   [place] (SULFONAMIDE ANTIBIOTICS)      Reviewed, no changes.    Vitals Date Temp F BP Pulse Ht In Wt Lb BMI BSA Pain Score  08/30/2014  134/72 77 63 143 25.33  3/10      IMPRESSION Patient has had progressive worsening due to back pain and lumbar spinal stenosis.  She has pseudarthrosis at L5-S1 and progressive degenerative changes with spondylolisthesis and foraminal stenosis at L1-2 level.  Her pain has been refractory to injections and medications and physical therapy.  Completed Orders (this encounter) Order Details Reason Side Interpretation Result Initial Treatment Date Region  Lifestyle education regarding diet Encouraged to eat a well balanced diet and follow  up with primary care physician.         Assessment/Plan # Detail Type Description   1. Assessment Spinal stenosis of lumbar region (M48.06).       2. Assessment Lumbar radiculopathy (M54.16).       3. Assessment Lumbar post-laminectomy syndrome (M96.1).       4. 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       5. Assessment Low back pain, unspecified back pain laterality, with sciatica presence unspecified (M54.5).       6. Assessment Spondylolisthesis, lumbar region (M43.16).       7. Assessment Idiopathic scoliosis of lumbar region (M41.26).       8. Assessment  Body mass index (BMI) 25.0-25.9, adult (G95.62).   Plan Orders Today's instructions / counseling include(s) Lifestyle education regarding diet.         Pain Assessment/Treatment Pain Scale: 3/10. Method: Numeric Pain Intensity Scale. Location: back/feet. Onset: 07/28/2013. Duration: varies. Quality: discomforting. Pain Assessment/Treatment follow-up plan of care: Patient is taking medications as prescribed..  Fall Risk Plan The patient has not fallen in the last year.  Plan is decompression and fusion T10 through ilium with Airo intraoperative CT scanning technology.  This has been scheduled for 7 April.  Orders: Diagnostic Procedures: Assessment Procedure  S32.009K Exploration and Extension of Fusion t10 - Ilium with Airo  Instruction(s)/Education: Assessment Instruction  [phone] Lifestyle education regarding diet             Provider:  Marchia Meiers. Vertell Limber MD  09/04/2014 01:03 PM Dictation edited by: Marchia Meiers. Vertell Limber    CC Providers: [facility] [place],  [place]  [id number]-   Robert Gardner [address], [place] [address]              Electronically signed by Marchia Meiers. Vertell Limber MD on 09/04/2014 01:03 PM

## 2014-10-07 NOTE — Progress Notes (Signed)
Gold band given back to patient.  Patient placed the ring on her finger. Cori Razor, RN

## 2014-10-07 NOTE — Progress Notes (Signed)
Gold colored band in a plastic bio hazard bag found in chart/unable to locate family, given to [place] rn upon reporting off

## 2014-10-07 NOTE — Progress Notes (Signed)
Paged Dr. Donald Pore answering service to receive orders for a nicotine patch per patients request.

## 2014-10-07 NOTE — Progress Notes (Signed)
Awake, alert, conversant.  MAEW with good power.  Doing well.  

## 2014-10-07 NOTE — Anesthesia Procedure Notes (Signed)
Procedure Name: Intubation Date/Time: 10/07/2014 7:50 AM Performed by: Susa Loffler Pre-anesthesia Checklist: Patient identified, Timeout performed, Emergency Drugs available, Suction available and Patient being monitored Patient Re-evaluated:Patient Re-evaluated prior to inductionOxygen Delivery Method: Circle system utilized Preoxygenation: Pre-oxygenation with 100% oxygen Intubation Type: IV induction Ventilation: Mask ventilation without difficulty Laryngoscope Size: Mac and 3 Grade View: Grade I Tube type: Oral Tube size: 7.0 mm Number of attempts: 1 Airway Equipment and Method: Stylet Placement Confirmation: ETT inserted through vocal cords under direct vision,  positive ETCO2 and breath sounds checked- equal and bilateral Secured at: 21 cm Tube secured with: Tape Dental Injury: Teeth and Oropharynx as per pre-operative assessment

## 2014-10-07 NOTE — Interval H&P Note (Signed)
History and Physical Interval Note:  10/07/2014 7:13 AM  Sandra [name]  has presented today for surgery, with the diagnosis of Scoliosis, Idiopathic  The various methods of treatment have been discussed with the patient and family. After consideration of risks, benefits and other options for treatment, the patient has consented to  Procedure(s) with comments: L1-2 Posterior lumbar interbody fusion//Pedicle screws and rods laterally at T10-L2/Exploration of prior fusion L2-S1//Fusion at S1-Ileum, iliac bolts bilaterally//Aero.   (N/A) - L1-2 Decompression and fusion with exploration and extension of fusion//iliac fixation//Airo APPLICATION OF INTRAOPERATIVE CT SCAN (N/A) as a surgical intervention .  The patient's history has been reviewed, patient examined, no change in status, stable for surgery.  I have reviewed the patient's chart and labs.  Questions were answered to the patient's satisfaction.     Sandra [name] D

## 2014-10-07 NOTE — Op Note (Signed)
10/07/2014  2:11 PM  PATIENT:  Sandra [name]  74 y.o. female  PRE-OPERATIVE DIAGNOSIS:  Thoracolumbar Scoliosis, with post-junctional kyphosis, lumbosacral pseudoarthrosis, lumbago, radiculopathy, spinal stenosis  POST-OPERATIVE DIAGNOSIS:  Thoracolumbar Scoliosis, with post-junctional kyphosis, lumbosacral pseudoarthrosis, lumbago, radiculopathy, spinal stenosis   PROCEDURE:  Procedure(s) with comments: Exploration of Fusion, Decompression Lumbar one-three, Revision of Posterior Segmental Instrumentation Thoracic ten-Ilium with Posterior Lateral Arthrodesis (N/A) - L1-2 Decompression and fusion with exploration and extension of fusion//iliac fixation//Airo APPLICATION OF INTRAOPERATIVE CT SCAN (N/A)  SURGEON:  Surgeon(s) and Role:    * Erline Levine, MD - Primary  PHYSICIAN ASSISTANT: Sherwood Gambler, MD  ASSISTANTS: Poteat, RN   ANESTHESIA:   general  EBL:  Total I/O In: 2300 [I.V.:2300] Out: 750 [Urine:500; Blood:250]  BLOOD ADMINISTERED:none  DRAINS: (Medium) Hemovact drain(s) in the epidural space with  Suction Open   LOCAL MEDICATIONS USED:  MARCAINE     SPECIMEN:  No Specimen  DISPOSITION OF SPECIMEN:  N/A  COUNTS:  YES  TOURNIQUET:  * No tourniquets in log *  DICTATION: DICTATION: Patient is 74 year old woman with prior decompression and fusion for scoliosis with pseudoarthrosis at  L 5 S 1 and post-junctional kyphosis L 1 - 2 level with stenosis with a long history of severe back and bilateral leg pain, right greater than left.  It was elected to take her to surgery for exploration of fusion with decompression L 12 with revision fusion T 10  To ilium.    Procedure: Patient was placed in a prone position on the [place] table attachment for the Aero intra-op CT/ navigation system after smooth and uncomplicated induction of general endotracheal anesthesia. Her lback was prepped and draped in usual sterile fashion with betadine scrub and DuraPrep. Area of incision was  infiltrated with local lidocaine. Incision was made to the lumbodorsal fascia was incised and exposure was performed of the T 10 to the ilium bilaterally including previously placed hardware. All previously placed hardware was removed and the screws at  S 1 were loose.  There was no good bone growth at the lumbosacral  levels and obvious motion at this level, but good bone growth from L 2 through L 5 levels.  Revision hardware was placed L 2 - S 1 levels.   Intraoperative CT scanwas obtained which confirmed correct orientation with marker probes at L2 through ilium levels.   There was good correction of scoliotic curvature. The posterolateral region was extensively decorticated and pedicle screws were placed at T 10  Through L 1 bilaterally and in the ilium bilaterally using intraoperative navigation.  40 x 5.0 mm pedicle screws were placed at T 10 and T 11, at T 12 bilaterally and 45 x 5.0 mm screws were placed at L 1, 5.5 x 45  at L 2, L 3, bilateral L 4, 6.5 x 45 at  L 5 bilaterally , and at S 1  7.5  X 45 on the bilaterally.  Iliac screws were placed bilaterally (8.5 x 70 right and 8.5 x 70 left) with good purchase using navigation. Decompressive lumbar laminectomy was performed including facetectomy L 1 - L 3 levels.  Autograft was harvested from both ilia for use in fusion along with marrow rich blood. Rods were cut, bent and locked down bilaterally in situ using Bendini rod bending system. and the posterolateral region was packed with autograft, medium BMP, Vitoss soaked in marrow-rich blood and 40 cc of allograft after decorticating the posterolateral region. The wound was irrigated. A medium Hemovac  drain was placed.Fascia was closed with 1 Vicryl sutures skin edges were reapproximated 2 and 3-0 Vicryl sutures. Long-acting Marcaine was infiltrated in the musculature. Vancomycin powder was placed in the woundThe wound was dressed with Dermabond and an occlusive dressing.  the patient was extubated in the  operating room and taken to recovery in stable satisfactory condition she tolerated traction well counts were correct at the end of the case.  PLAN OF CARE: Admit to inpatient   PATIENT DISPOSITION:  PACU - hemodynamically stable.   Delay start of Pharmacological VTE agent (>24hrs) due to surgical blood loss or risk of bleeding: yes

## 2014-10-07 NOTE — Brief Op Note (Signed)
10/07/2014  2:11 PM  PATIENT:  Sandra [name]  74 y.o. female  PRE-OPERATIVE DIAGNOSIS:  Thoracolumbar Scoliosis, with post-junctional kyphosis, lumbosacral pseudoarthrosis, lumbago, radiculopathy, spinal stenosis  POST-OPERATIVE DIAGNOSIS:  Thoracolumbar Scoliosis, with post-junctional kyphosis, lumbosacral pseudoarthrosis, lumbago, radiculopathy, spinal stenosis   PROCEDURE:  Procedure(s) with comments: Exploration of Fusion, Decompression Lumbar one-three, Revision of Posterior Segmental Instrumentation Thoracic ten-Ilium with Posterior Lateral Arthrodesis (N/A) - L1-2 Decompression and fusion with exploration and extension of fusion//iliac fixation//Airo APPLICATION OF INTRAOPERATIVE CT SCAN (N/A)  SURGEON:  Surgeon(s) and Role:    * Erline Levine, MD - Primary  PHYSICIAN ASSISTANT: Sherwood Gambler, MD  ASSISTANTS: Poteat, RN   ANESTHESIA:   general  EBL:  Total I/O In: 2300 [I.V.:2300] Out: 750 [Urine:500; Blood:250]  BLOOD ADMINISTERED:none  DRAINS: (Medium) Hemovact drain(s) in the epidural space with  Suction Open   LOCAL MEDICATIONS USED:  MARCAINE     SPECIMEN:  No Specimen  DISPOSITION OF SPECIMEN:  N/A  COUNTS:  YES  TOURNIQUET:  * No tourniquets in log *  DICTATION: DICTATION: Patient is 74 year old woman with prior decompression and fusion for scoliosis with pseudoarthrosis at  L 5 S 1 and post-junctional kyphosis L 1 - 2 level with stenosis with a long history of severe back and bilateral leg pain, right greater than left.  It was elected to take her to surgery for exploration of fusion with decompression L 12 with revision fusion T 10  To ilium.    Procedure: Patient was placed in a prone position on the [place] table attachment for the Aero intra-op CT/ navigation system after smooth and uncomplicated induction of general endotracheal anesthesia. Her lback was prepped and draped in usual sterile fashion with betadine scrub and DuraPrep. Area of incision was  infiltrated with local lidocaine. Incision was made to the lumbodorsal fascia was incised and exposure was performed of the T 10 to the ilium bilaterally including previously placed hardware. All previously placed hardware was removed and the screws at  S 1 were loose.  There was no good bone growth at the lumbosacral  levels and obvious motion at this level, but good bone growth from L 2 through L 5 levels.  Revision hardware was placed L 2 - S 1 levels.   Intraoperative CT scanwas obtained which confirmed correct orientation with marker probes at L2 through ilium levels.   There was good correction of scoliotic curvature. The posterolateral region was extensively decorticated and pedicle screws were placed at T 10  Through L 1 bilaterally and in the ilium bilaterally using intraoperative navigation.  40 x 5.0 mm pedicle screws were placed at T 10 and T 11, at T 12 bilaterally and 45 x 5.0 mm screws were placed at L 1, 5.5 x 45  at L 2, L 3, bilateral L 4, 6.5 x 45 at  L 5 bilaterally , and at S 1  7.5  X 45 on the bilaterally.  Iliac screws were placed bilaterally (8.5 x 70 right and 8.5 x 70 left) with good purchase using navigation. Decompressive lumbar laminectomy was performed including facetectomy L 1 - L 3 levels.  Autograft was harvested from both ilia for use in fusion along with marrow rich blood. Rods were cut, bent and locked down bilaterally in situ using Bendini rod bending system. and the posterolateral region was packed with autograft, medium BMP, Vitoss soaked in marrow-rich blood and 40 cc of allograft after decorticating the posterolateral region. The wound was irrigated. A medium Hemovac  drain was placed.Fascia was closed with 1 Vicryl sutures skin edges were reapproximated 2 and 3-0 Vicryl sutures. Long-acting Marcaine was infiltrated in the musculature. Vancomycin powder was placed in the woundThe wound was dressed with Dermabond and an occlusive dressing.  the patient was extubated in the  operating room and taken to recovery in stable satisfactory condition she tolerated traction well counts were correct at the end of the case.  PLAN OF CARE: Admit to inpatient   PATIENT DISPOSITION:  PACU - hemodynamically stable.   Delay start of Pharmacological VTE agent (>24hrs) due to surgical blood loss or risk of bleeding: yes

## 2014-10-07 NOTE — Transfer of Care (Signed)
Immediate Anesthesia Transfer of Care Note  Patient: Sandra [name]  Procedure(s) Performed: Procedure(s) with comments: Exploration of Fusion, Decompression Lumbar one-three, Revision of Posterior Segmental Instrumentation Thoracic ten-Ilium with Posterior Lateral Arthrodesis (N/A) - L1-2 Decompression and fusion with exploration and extension of fusion//iliac fixation//Airo APPLICATION OF INTRAOPERATIVE CT SCAN (N/A)  Patient Location: PACU  Anesthesia Type:General  Level of Consciousness: awake, alert  and oriented  Airway & Oxygen Therapy: Patient Spontanous Breathing and Patient connected to nasal cannula oxygen  Post-op Assessment: Report given to RN and pt hypotensive initially in PACU, fluid bolus given, Dr. Lissa Hoard notified.  Post vital signs: Reviewed and stable  Last Vitals:  Filed Vitals:   10/07/14 1430  BP: 125/68  Pulse: 83  Temp:   Resp: 20    Complications: No apparent anesthesia complications

## 2014-10-07 NOTE — Progress Notes (Signed)
Pt arrived to 4N31 from PACU.  Welcomed and settled into the room.  Call bell within reach.  No c/o pain at this time.  170 mL emptied from hemovac.  Will continue to monitor. Cori Razor, RN

## 2014-10-07 NOTE — Anesthesia Postprocedure Evaluation (Signed)
Anesthesia Post Note  Patient: Sandra [name]  Procedure(s) Performed: Procedure(s) (LRB): Exploration of Fusion, Decompression Lumbar one-three, Revision of Posterior Segmental Instrumentation Thoracic ten-Ilium with Posterior Lateral Arthrodesis (N/A) APPLICATION OF INTRAOPERATIVE CT SCAN (N/A)  Anesthesia type: General  Patient location: PACU  Post pain: Pain level controlled  Post assessment: Post-op Vital signs reviewed  Last Vitals: BP 94/68 mmHg  Pulse 80  Temp(Src) 36.4 C (Oral)  Resp 10  SpO2 99%  Post vital signs: Reviewed  Level of consciousness: sedated  PACU: Some hypotension in PACU responsive to fluid bolus  Complications: No apparent anesthesia complications

## 2014-10-07 NOTE — Clinical Social Work Note (Signed)
CSW Consult Acknowledged:   CSW received a consult for SNF placement. CSW awaiting PT/OT evaluation to determine the appropriate level of care.      Riely Baskett, MSW, LCSWA [phone]  

## 2014-10-08 MED ORDER — HYDROMORPHONE HCL 2 MG PO TABS
2.0000 mg | ORAL_TABLET | ORAL | Status: DC | PRN
  Administered 2014-10-08 – 2014-10-12 (×5): 4 mg via ORAL
  Filled 2014-10-08: qty 2
  Filled 2014-10-08: qty 1
  Filled 2014-10-08 (×5): qty 2

## 2014-10-08 MED ORDER — PANTOPRAZOLE SODIUM 40 MG PO TBEC
40.0000 mg | DELAYED_RELEASE_TABLET | Freq: Every day | ORAL | Status: DC
Start: 2014-10-08 — End: 2014-10-12
  Administered 2014-10-08 – 2014-10-10 (×3): 40 mg via ORAL
  Filled 2014-10-08 (×5): qty 1

## 2014-10-08 MED FILL — Heparin Sodium (Porcine) Inj 1000 Unit/ML: INTRAMUSCULAR | Qty: 30 | Status: AC

## 2014-10-08 MED FILL — Sodium Chloride IV Soln 0.9%: INTRAVENOUS | Qty: 1000 | Status: AC

## 2014-10-08 NOTE — Clinical Social Work Placement (Addendum)
Clinical Social Work Department CLINICAL SOCIAL WORK PLACEMENT NOTE 10/08/2014  Patient:  Sandra [name], Sandra [name]  Account Number:  [id number] Admit date:  10/07/2014  Clinical Social Worker:  Greta Doom, LCSWA  Date/time:  10/08/2014 01:08 PM  Clinical Social Work is seeking post-discharge placement for this patient at the following level of care:   SKILLED NURSING   (*CSW will update this form in Epic as items are completed)   10/08/2014  Patient/family provided with [facility] Department of Clinical Social Work's list of facilities offering this level of care within the geographic area requested by the patient (or if unable, by the patient's family).  10/08/2014  Patient/family informed of their freedom to choose among providers that offer the needed level of care, that participate in Medicare, Medicaid or managed care program needed by the patient, have an available bed and are willing to accept the patient.  10/08/2014  Patient/family informed of [facility]' ownership interest in [facility], as well as of the fact that they are under no obligation to receive care at this facility.  PASARR submitted to EDS on 10/08/2014 PASARR number received on 10/08/2014  FL2 transmitted to all facilities in geographic area requested by pt/family on  10/08/2014 FL2 transmitted to all facilities within larger geographic area on 10/08/2014  Patient informed that his/her managed care company has contracts with or will negotiate with  certain facilities, including the following:     Patient/family informed of bed offers received:  10/08/2014 Patient chooses bed at [facility] Physician recommends and patient chooses bed at    Patient to be transferred to [place] on 10/12/2014  Patient to be transferred to facility by [facility]  Patient and family notified of transfer on 10/12/2014 Name of family member notified: Pt will notify her husband   The following physician request were entered in  Epic:   Additional Comments:  [place], MSW, [place]

## 2014-10-08 NOTE — Progress Notes (Signed)
Pt BP rechecked 114/46.  IV fluids still running.  No c/o dizziness at this time.  Will continue to monitor. Cori Razor, RN

## 2014-10-08 NOTE — Evaluation (Signed)
Physical Therapy Evaluation Patient Details Name: Sandra [name] MRN: [id number] DOB: 1940/10/03 Today's Date: 10/08/2014   History of Present Illness  Patient is a 74 yo female with prior lumbar fushion, presents now for L1-2 Posterior lumbar interbody fusion//Pedicle screws and rods laterally at T10-L2/Exploration of prior fusion L2-S1//Fusion at S1-Ileum, iliac bolts bilaterally//Aero.   Clinical Impression  Patient demonstrates deficits in functional mobility as indicated below. Will need continued skilled PT to address deficits and maximize function. Will see as indicated and progress as tolerated. Educated patient regarding back precautions, use of brace, pain management and positional changes. Patient appreciative and receptive.    Follow Up Recommendations SNF    Equipment Recommendations  3in1 (PT)    Recommendations for Other Services       Precautions / Restrictions Precautions Precautions: Back;Fall Precaution Comments: educated regarding back precautions, wearing of brace, pain management Required Braces or Orthoses: Spinal Brace Spinal Brace: Thoracolumbosacral orthotic      Mobility  Bed Mobility Overal bed mobility: Needs Assistance Bed Mobility: Rolling;Sidelying to Sit Rolling: Min assist Sidelying to sit: Mod assist       General bed mobility comments: Increased assist to elevate trunk secondary to pain, patient educated on log roll technique and positioning   Transfers Overall transfer level: Needs assistance Equipment used: Rolling walker (2 wheeled) Transfers: Sit to/from Stand Sit to Stand: Mod assist         General transfer comment: Moderate assist to elevate to standing. Bilateral LE weakness noted, and increased pain with elevation.  Ambulation/Gait Ambulation/Gait assistance: Min assist Ambulation Distance (Feet): 6 Feet Assistive device: Rolling walker (2 wheeled) Gait Pattern/deviations: Step-to pattern     General Gait Details:  heavy reliance on RW, cues for upright posture, limited by increased pain  Stairs            Wheelchair Mobility    Modified Rankin (Stroke Patients Only)       Balance Overall balance assessment: Needs assistance   Sitting balance-Leahy Scale: Good     Standing balance support: Bilateral upper extremity supported;During functional activity Standing balance-Leahy Scale: Poor Standing balance comment: heavy reliance on UE support at this time                             Pertinent Vitals/Pain Pain Assessment: 0-10 Pain Score: 7  Pain Location: back Pain Descriptors / Indicators: Aching;Discomfort;Sore Pain Intervention(s): Monitored during session;Limited activity within patient's tolerance;Premedicated before session;Repositioned    Home Living Family/patient expects to be discharged to:: Private residence Living Arrangements: Spouse/significant other Available Help at Discharge: Family Type of Home: House Home Access: Stairs to enter Entrance Stairs-Rails: Can reach both Entrance Stairs-Number of Steps: 3 Home Layout: One level Home Equipment: [place] - 2 wheels;Cane - single point;Crutches;Electric scooter Additional Comments: husband works    Prior Function Level of Independence: Independent               Journalist, newspaper   Dominant Hand: Right    Extremity/Trunk Assessment   Upper Extremity Assessment: Overall WFL for tasks assessed           Lower Extremity Assessment: Generalized weakness (decreased coordination BLEs)         Communication   Communication: No difficulties  Cognition Arousal/Alertness: Awake/alert Behavior During Therapy: WFL for tasks assessed/performed Overall Cognitive Status: Within Functional Limits for tasks assessed  General Comments      Exercises        Assessment/Plan    PT Assessment Patient needs continued PT services  PT Diagnosis Difficulty  walking;Abnormality of gait;Generalized weakness;Acute pain   PT Problem List Decreased strength;Decreased range of motion;Decreased activity tolerance;Decreased balance;Decreased mobility;Decreased coordination;Decreased knowledge of precautions;Pain  PT Treatment Interventions DME instruction;Gait training;Stair training;Functional mobility training;Therapeutic activities;Therapeutic exercise;Balance training;Patient/family education   PT Goals (Current goals can be found in the Care Plan section) Acute Rehab PT Goals Patient Stated Goal: to go to rehab and get better PT Goal Formulation: With patient Time For Goal Achievement: 10/22/14 Potential to Achieve Goals: Good    Frequency Min 5X/week   Barriers to discharge Decreased caregiver support husband works, plan is for d/c to SNF    Co-evaluation               End of Session Equipment Utilized During Treatment: Gait belt;Back brace Activity Tolerance: Patient limited by pain Patient left: in chair;with call bell/phone within reach;with chair alarm set Nurse Communication: Mobility status         Time: 0630-1601 PT Time Calculation (min) (ACUTE ONLY): 25 min   Charges:   PT Evaluation $Initial PT Evaluation Tier I: 1 Procedure PT Treatments $Self Care/Home Management: 8-22   PT G CodesDuncan [name] 10-12-2014, 10:26 AM Sandra [name], PT DPT  [phone]

## 2014-10-08 NOTE — Evaluation (Signed)
Occupational Therapy Evaluation Patient Details Name: Sandra [name] MRN: [id number] DOB: [birth year] Today's Date: 10/08/2014    History of Present Illness Patient is a 74 yo female with prior lumbar fushion, presents now for L1-2 Posterior lumbar interbody fusion//Pedicle screws and rods laterally at T10-L2/Exploration of prior fusion L2-S1//Fusion at S1-Ileum, iliac bolts bilaterally//Aero.    Clinical Impression   Pt still with significant pain and limited mobility this session.  Overall min to mod assist for selfcare tasks.  Will benefit from acute care OT to help increase knowledge and independence with basic selfcare tasks.  Pt plans to discharge to SNF for further rehab as her spouse works and is not available for assistance 24/7.      Follow Up Recommendations  SNF    Equipment Recommendations  Other (comment) (TBD next venue of care)       Precautions / Restrictions Precautions Precautions: Back;Fall Precaution Comments: educated regarding back precautions, wearing of brace, pain management Required Braces or Orthoses: Spinal Brace Spinal Brace: Thoracolumbosacral orthotic;Applied in supine position (per MD order) Restrictions Weight Bearing Restrictions: No      Mobility Bed Mobility Overal bed mobility: Needs Assistance Bed Mobility: Rolling;Sidelying to Sit;Sit to Sidelying Rolling: Min assist Sidelying to sit: Mod assist;Min assist       General bed mobility comments: Pt needed min assist to elevate trunk from sidelying to sit.  Transfers Overall transfer level: Needs assistance Equipment used: Rolling walker (2 wheeled)   Sit to Stand: Min assist         General transfer comment: Pt needs min assist to stand with increased time secondary to back pain.    Balance     Sitting balance-Leahy Scale: Good       Standing balance-Leahy Scale: Poor                              ADL Overall ADL's : Needs assistance/impaired Eating/Feeding:  Independent;Sitting   Grooming: Sitting;Supervision/safety                   Toilet Transfer: Minimal Systems analyst Details (indicate cue type and reason): simulated         Functional mobility during ADLs: Minimal assistance General ADL Comments: Limited evaluation secondary to back pain and not tolerating back brace.  Orders for brace to be donned in supine but could not obtain good fit and when sitting EOB pt reported it riding too high and being uncomfortable.  Contacted [employer] to assess fit as pt states she was instructed that shoulder straps go under her arms and not over them.  She only tolerated standing X 2 with min assist and taking short steps up toward the top of the bed.  Diiscussed need for AE to increase independence with basic selfcare tasks, but did not demonstrate or practice.      Vision Vision Assessment?: No apparent visual deficits   Perception Perception Perception Tested?: No   Praxis Praxis Praxis tested?: Not tested    Pertinent Vitals/Pain Pain Assessment: 0-10 Pain Score: 7  Pain Location: back Pain Descriptors / Indicators: Discomfort;Stabbing Pain Intervention(s): Limited activity within patient's tolerance;Premedicated before session;Repositioned     Hand Dominance Right   Extremity/Trunk Assessment Upper Extremity Assessment Upper Extremity Assessment: Overall WFL for tasks assessed   Lower Extremity Assessment Lower Extremity Assessment: Defer to PT evaluation       Communication Communication Communication: No difficulties   Cognition Arousal/Alertness: Awake/alert  Behavior During Therapy: Anxious;Agitated Overall Cognitive Status: Within Functional Limits for tasks assessed                                Home Living Family/patient expects to be discharged to:: Private residence Living Arrangements: Spouse/significant other Available Help at Discharge: Family Type of Home:  House Home Access: Stairs to enter Technical brewer of Steps: 3 Entrance Stairs-Rails: Can reach both Home Layout: One level               Home Equipment: [place] - 2 wheels;Cane - single point;Crutches;Electric scooter   Additional Comments: husband works      Prior Functioning/Environment Level of Independence: Independent             OT Diagnosis: Generalized weakness;Acute pain   OT Problem List: Decreased strength;Decreased activity tolerance;Impaired balance (sitting and/or standing);Pain;Decreased knowledge of use of DME or AE;Decreased knowledge of precautions   OT Treatment/Interventions: Self-care/ADL training;Patient/family education;Balance training;Therapeutic activities;DME and/or AE instruction    OT Goals(Current goals can be found in the care plan section) Acute Rehab OT Goals Patient Stated Goal: to get her pain better OT Goal Formulation: With patient Time For Goal Achievement: 10/15/14 Potential to Achieve Goals: Good  OT Frequency: Min 2X/week              End of Session Equipment Utilized During Treatment: Back brace Nurse Communication: Mobility status;Precautions  Activity Tolerance: Patient limited by pain Patient left: in bed;with call bell/phone within reach;with family/visitor present   Time: 1427-1500 OT Time Calculation (min): 33 min Charges:  OT General Charges $OT Visit: 1 Procedure OT Evaluation $Initial OT Evaluation Tier I: 1 Procedure OT Treatments $Self Care/Home Management : 23-37 mins  Bentli Llorente OTR/L 10/08/2014, 3:54 PM

## 2014-10-08 NOTE — Progress Notes (Signed)
Subjective: Patient reports "I'm sore, but I think I'm doing ok"  Objective: Vital signs in last 24 hours: Temp:  [97.3 F (36.3 C)-98.1 F (36.7 C)] 98 F (36.7 C) (04/08 0535) Pulse Rate:  [67-85] 82 (04/08 0535) Resp:  [10-20] 18 (04/08 0535) BP: (83-125)/(42-68) 83/42 mmHg (04/08 0535) SpO2:  [94 %-100 %] 94 % (04/08 0535) Weight:  [70.8 kg (156 lb 1.4 oz)] 70.8 kg (156 lb 1.4 oz) (04/07 1542)  Intake/Output from previous day: 04/07 0701 - 04/08 0700 In: 2300 [I.V.:2300] Out: 2420 [Urine:1750; Drains:420; Blood:250] Intake/Output this shift:    Alert, conversant. MAEW. Not OOB yet today. Back pain still requiring IV med. No leg pain. Incision without erythema, swelling, or drainage. Hemovac patent ~260ml overnight post-op. Good strength BLE.  (*Of Note* Pt asymptomatic of 83/42 BP recorded at 0535)  Lab Results: No results for input(s): WBC, HGB, HCT, PLT in the last 72 hours. BMET No results for input(s): NA, K, CL, CO2, GLUCOSE, BUN, CREATININE, CALCIUM in the last 72 hours.  Studies/Results: No results found.  Assessment/Plan: Improving   LOS: 1 day  Per Dr. Vertell Limber, d/c Foley, but have BSC present d/t her hx urge & stress incontinence.  Change pain med to Dilaudid PO 2-4mg  q2-3hrs    Verdis Prime 10/08/2014, 8:19 AM

## 2014-10-08 NOTE — Progress Notes (Signed)
Called Dr. Arnoldo Morale on call provider about patients blood pressure 90/45; he is not concerned unless patient become symptomatic (tachycardic) or stops producing urine.

## 2014-10-08 NOTE — Progress Notes (Addendum)
Per nurse tech patient BP low while sitting.  Manual BP taken, 82/46.  Pt assisted back to bed, c/o dizziness upon standing.  Pt hooked back to IV fluids, pain medication held at this time.  Will continue to monitor. Cori Razor, RN

## 2014-10-08 NOTE — Progress Notes (Signed)
CARE MANAGEMENT NOTE 10/08/2014  Patient:  Sandra [name], Sandra [name]   Account Number:  [id number]  Date Initiated:  10/08/2014  Documentation initiated by:  Olga Coaster  Subjective/Objective Assessment:   ADMITTED FOR SURGERY     Action/Plan:   CM FOLLOWING FOR DCP   Anticipated DC Date:  10/13/2014   Anticipated DC Plan:  [facility]  In-house referral  Clinical Social Worker      DC Planning Services  CM consult        Status of service:  In process, will continue to follow  Per UR Regulation:  Reviewed for med. necessity/level of care/duration of stay  Comments:  4/8/2016Mindi Slicker RN,BSN,MHA [phone]

## 2014-10-08 NOTE — Clinical Social Work Psychosocial (Signed)
Clinical Social Work Department BRIEF PSYCHOSOCIAL ASSESSMENT 10/08/2014  Patient:  Sandra [name], Sandra [name]     Account Number:  [id number]     Admit date:  10/07/2014  Clinical Social Worker:  Marciano Sequin  Date/Time:  10/08/2014 10:58 AM  Referred by:  RN  Date Referred:  10/08/2014 Referred for  SNF Placement   Other Referral:   Interview type:  Patient Other interview type:    PSYCHOSOCIAL DATA Living Status:  HUSBAND Admitted from facility:   Level of care:   Primary support name:  Sandra [name],Sandra [name] Primary support relationship to patient:  SPOUSE Degree of support available:   Strong Support    CURRENT CONCERNS Current Concerns  None Noted   Other Concerns:    SOCIAL WORK ASSESSMENT / PLAN CSW met the pt at the bedside.  CSW introduced self and purpose of the visit. CSW discussed clinical recommendation for SNF rehab. CSW inquired about the geographical location in which the pt would like to receive rehab from. Pt reported that she has pre-arranged rehab for [facility]. CSW explained the SNF rehab process to the pt. CSW and pt discussed insurance and its relation to SNF rehab. CSW answered all questions in which the pt inquired about. CSW provided pt with contact information for further questions. CSW will continue to follow this pt and assist with discharge as needed.   Assessment/plan status:  Psychosocial Support/Ongoing Assessment of Needs Other assessment/ plan:   Information/referral to community resources:    PATIENT'S/FAMILY'S RESPONSE TO CURRENT DIAGNOSE: Pt presented with a happy affect and mood. Pt joke around. Pt provided insight into her life prior to having surgery. Pt expressed desire to receive rehab, so she can return back to her daily routine.   PATIENT'S/FAMILY'S RESPONSE TO PLAN OF CARE: Pt was receptive to the plan of care. Pt will be going to [facility].    [place], MSW, [place]

## 2014-10-09 NOTE — Progress Notes (Signed)
Physical Therapy Treatment Patient Details Name: Sandra [name] MRN: [id number] DOB: [birth year] Today's Date: 10/09/2014    History of Present Illness Patient is a 74 yo female with prior lumbar fushion, presents now for L1-2 Posterior lumbar interbody fusion//Pedicle screws and rods laterally at T10-L2/Exploration of prior fusion L2-S1//Fusion at S1-Ileum, iliac bolts bilaterally//Aero.     PT Comments    Patient progressing with mobility, ambulated in hall and to the bathroom, able to perform self care tasks with cues. Continues to require reinforcement for precautions. Will continue to see and progress as tolerated.  Follow Up Recommendations  SNF     Equipment Recommendations  3in1 (PT)    Recommendations for Other Services       Precautions / Restrictions Precautions Precautions: Back;Fall Precaution Comments: educated regarding back precautions, wearing of brace, pain management Required Braces or Orthoses: Spinal Brace Spinal Brace: Thoracolumbosacral orthotic;Applied in supine position (per MD order) Restrictions Weight Bearing Restrictions: No    Mobility  Bed Mobility Overal bed mobility: Needs Assistance Bed Mobility: Rolling;Sidelying to Sit;Sit to Sidelying Rolling: Min assist Sidelying to sit: Mod assist;Min assist       General bed mobility comments: Pt needed min assist to elevate trunk from sidelying to sit.  Transfers Overall transfer level: Needs assistance Equipment used: Rolling walker (2 wheeled) Transfers: Sit to/from Stand Sit to Stand: Min assist         General transfer comment: Pt needs min assist to stand with increased time secondary to back pain.  Ambulation/Gait Ambulation/Gait assistance: Min guard Ambulation Distance (Feet): 60 Feet Assistive device: Rolling walker (2 wheeled) Gait Pattern/deviations: Step-to pattern     General Gait Details: continues to demonstrate heavy reliance on RW, cues for upright posture, limited by  increased pain   Stairs            Wheelchair Mobility    Modified Rankin (Stroke Patients Only)       Balance Overall balance assessment: Needs assistance   Sitting balance-Leahy Scale: Good       Standing balance-Leahy Scale: Fair Standing balance comment: continues to demonstrate increased reliance on RW                    Cognition Arousal/Alertness: Awake/alert Behavior During Therapy: WFL for tasks assessed/performed Overall Cognitive Status: Within Functional Limits for tasks assessed                      Exercises      General Comments General comments (skin integrity, edema, etc.): patient able to perform some self care and hygiene      Pertinent Vitals/Pain Pain Assessment: 0-10 Pain Score: 5  Pain Location: back Pain Descriptors / Indicators: Discomfort Pain Intervention(s): Premedicated before session;Monitored during session    Home Living                      Prior Function            PT Goals (current goals can now be found in the care plan section) Acute Rehab PT Goals Patient Stated Goal: to get her pain better PT Goal Formulation: With patient Time For Goal Achievement: 10/22/14 Potential to Achieve Goals: Good Progress towards PT goals: Progressing toward goals    Frequency  Min 5X/week    PT Plan Current plan remains appropriate    Co-evaluation             End of Session Equipment Utilized During  Treatment: Gait belt;Back brace Activity Tolerance: Patient limited by pain Patient left: in chair;with call bell/phone within reach;with chair alarm set;with family/visitor present     Time: 0761-5183 PT Time Calculation (min) (ACUTE ONLY): 23 min  Charges:  $Gait Training: 8-22 mins $Therapeutic Activity: 8-22 mins                    G CodesDuncan Dull 10/17/2014, 3:52 PM Alben Deeds, [place] DPT  [phone]

## 2014-10-09 NOTE — Progress Notes (Signed)
Patient ID: Sandra [name], female   DOB: 04-17-1941, 74 y.o.   MRN: [id number] Subjective:  The patient is alert and pleasant. She continues to have difficulty with pain control.  Objective: Vital signs in last 24 hours: Temp:  [98.2 F (36.8 C)-99.9 F (37.7 C)] 98.2 F (36.8 C) (04/09 0915) Pulse Rate:  [72-86] 83 (04/09 0915) Resp:  [16-20] 20 (04/09 0915) BP: (82-123)/(36-52) 123/48 mmHg (04/09 0915) SpO2:  [95 %-100 %] 100 % (04/09 0915)  Intake/Output from previous day: 04/08 0701 - 04/09 0700 In: -  Out: 720 [Urine:500; Drains:220] Intake/Output this shift:    Physical exam patient is alert and oriented. She is moving her lower extremities well.  Lab Results: No results for input(s): WBC, HGB, HCT, PLT in the last 72 hours. BMET No results for input(s): NA, K, CL, CO2, GLUCOSE, BUN, CREATININE, CALCIUM in the last 72 hours.  Studies/Results: Dg Ct Oarm Limited Study  10/08/2014   CLINICAL DATA:    Intra-op CT was utilized by requesting physician. No radiographic  interpretation.     Assessment/Plan: Postop day #2: We will continue to mobilize the patient with PT.  LOS: 2 days     Josette Shimabukuro D 10/09/2014, 9:29 AM

## 2014-10-10 MED ORDER — KETOROLAC TROMETHAMINE 15 MG/ML IJ SOLN
15.0000 mg | Freq: Once | INTRAMUSCULAR | Status: AC
  Administered 2014-10-10: 15 mg via INTRAVENOUS

## 2014-10-10 MED ORDER — KETOROLAC TROMETHAMINE 15 MG/ML IJ SOLN
INTRAMUSCULAR | Status: AC
  Filled 2014-10-10: qty 1

## 2014-10-10 NOTE — Progress Notes (Signed)
Orders received to discontinue her lumbar drain; less then 20 ml bloody drainage at present time; discontinued the lumbar drain using sterile technique and suture removal kit; patient tolerated without distress; honeycomb dressing remains intact to her lumbar incision. Covered drain site with 2X2 gauze.;remains clean and dry.

## 2014-10-10 NOTE — Progress Notes (Signed)
Subjective: Patient reports significant back discomfort. Some mild aching in the legs but is better today. Some tingling in the right foot. Appears to be mobilizing well.  Objective: Vital signs in last 24 hours: Temp:  [98.2 F (36.8 C)-99.5 F (37.5 C)] 98.2 F (36.8 C) (04/10 0900) Pulse Rate:  [74-93] 83 (04/10 0900) Resp:  [16-20] 20 (04/10 0900) BP: (91-120)/(41-57) 115/57 mmHg (04/10 0900) SpO2:  [92 %-100 %] 98 % (04/10 0900)  Intake/Output from previous day: 04/09 0730 - 04/10 0729 In: 720 [P.O.:720] Out: 110 [Drains:110] Intake/Output this shift: Total I/O In: 240 [P.O.:240] Out: -   Neurologic: Grossly normal  Lab Results: Lab Results  Component Value Date   WBC 8.4 09/30/2014   HGB 12.7 09/30/2014   HCT 38.7 09/30/2014   MCV 92.4 09/30/2014   PLT 222 09/30/2014   No results found for: INR, PROTIME BMET Lab Results  Component Value Date   NA 141 09/30/2014   K 4.7 09/30/2014   CL 104 09/30/2014   CO2 28 09/30/2014   GLUCOSE 90 09/30/2014   BUN 16 09/30/2014   CREATININE 1.12* 09/30/2014   CALCIUM 9.8 09/30/2014    Studies/Results: No results found.  Assessment/Plan: Overall doing okay. Still has significant back pain requiring IV pain medications.   LOS: 3 days    Morgan Keinath S 10/10/2014, 11:05 AM

## 2014-10-10 NOTE — Progress Notes (Addendum)
Occupational Therapy Treatment Patient Details Name: Sandra [name] MRN: [id number] DOB: 12/31/1940 Today's Date: 10/10/2014    History of present illness Patient is a 74 yo female with prior lumbar fushion, presents now for L1-2 Posterior lumbar interbody fusion//Pedicle screws and rods laterally at T10-L2/Exploration of prior fusion L2-S1//Fusion at S1-Ileum, iliac bolts bilaterally//Aero.    OT comments  Pt progressing. Education provided in session. Pt planning to d/c to SNF.  Follow Up Recommendations  SNF    Equipment Recommendations  Other (comment) (defer to next venue)    Recommendations for Other Services      Precautions / Restrictions Precautions Precautions: Back;Fall Precaution Booklet Issued: No Precaution Comments: educated on back precautions Required Braces or Orthoses: Spinal Brace Spinal Brace: Thoracolumbosacral orthotic;Applied in supine position;Other (comment);Applied in sitting position (per MD order to be on in supine, however pt refusing and brace was donned in sitting) Restrictions Weight Bearing Restrictions: No       Mobility Bed Mobility Overal bed mobility: Modified Independent Bed Mobility: Sidelying to Sit   Sidelying to sit: Modified independent (Device/Increase time)          Transfers Overall transfer level: Needs assistance   Transfers: Sit to/from Stand Sit to Stand: Supervision              Balance  Supervision for ambulation with RW.                                 ADL Overall ADL's : Needs assistance/impaired                 Upper Body Dressing : Minimal assistance;Sitting (brace)   Lower Body Dressing: Set up;Supervision/safety;Sitting/lateral leans;With adaptive equipment (socks; performed with and without sockaid)   Toilet Transfer: Supervision/safety;Ambulation;RW (bed/chair)           Functional mobility during ADLs: Supervision/safety;Rolling walker General ADL Comments: pt able to  cross legs for LB dressing-cues for precautions. Explained sometimes it can vary day to day on whether one can cross legs over knees. OT  educated on AE and pt seemed interested. Educated on back brace. Pt refusing to don brace in supine, so performed sitting EOB. Educated on use of cup for oral care and placement of grooming items to avoid breaking precautions. Pt drinking coffee in session standing and sitting-supervision when standing. Cues for precautions in session.      Vision                     Perception     Praxis      Cognition  Awake/Alert Behavior During Therapy: WFL for tasks assessed/performed Overall Cognitive Status: Within Functional Limits for tasks assessed                       Extremity/Trunk Assessment               Exercises     Shoulder Instructions       General Comments      Pertinent Vitals/ Pain       Pain Assessment: 0-10 Pain Score: 7  Pain Location: back Pain Descriptors / Indicators: Sore Pain Intervention(s): Monitored during session;Repositioned  Home Living  Prior Functioning/Environment              Frequency Min 2X/week     Progress Toward Goals  OT Goals(current goals can now be found in the care plan section)  Progress towards OT goals: Progressing toward goals-updated a goal  Acute Rehab OT Goals Patient Stated Goal: not stated OT Goal Formulation: With patient Time For Goal Achievement: 10/15/14 Potential to Achieve Goals: Good ADL Goals Pt Will Perform Lower Body Bathing: with supervision;sit to/from stand;with adaptive equipment Pt Will Perform Upper Body Dressing: with mod assist;sitting (donning TLSO) Pt Will Perform Lower Body Dressing: with adaptive equipment;sit to/from stand;with supervision Pt Will Transfer to Toilet: with modified independence;ambulating;bedside commode (or comfort height toilet) Pt Will Perform Toileting -  Clothing Manipulation and hygiene: with supervision;sit to/from stand  Plan Discharge plan remains appropriate    Co-evaluation                 End of Session Equipment Utilized During Treatment: Gait belt;Rolling walker;Back brace   Activity Tolerance Patient tolerated treatment well   Patient Left in bed;with call bell/phone within reach;with family/visitor present   Nurse Communication          Time: 6004-5997 OT Time Calculation (min): 19 min  Charges: OT General Charges $OT Visit: 1 Procedure OT Treatments $Self Care/Home Management : 8-22 mins  Benito Mccreedy OTR/L [phone] 10/10/2014, 4:17 PM

## 2014-10-11 MED ORDER — HYDROMORPHONE HCL 2 MG PO TABS
2.0000 mg | ORAL_TABLET | ORAL | Status: DC | PRN
  Administered 2014-10-11 – 2014-10-12 (×5): 4 mg via ORAL
  Filled 2014-10-11 (×5): qty 2

## 2014-10-11 MED ORDER — HYDROMORPHONE HCL ER 8 MG PO T24A
8.0000 mg | EXTENDED_RELEASE_TABLET | ORAL | Status: DC
  Administered 2014-10-11: 8 mg via ORAL
  Filled 2014-10-11 (×2): qty 1

## 2014-10-11 MED ORDER — DIAZEPAM 2 MG PO TABS
2.0000 mg | ORAL_TABLET | Freq: Three times a day (TID) | ORAL | Status: DC | PRN
  Administered 2014-10-11 (×2): 2 mg via ORAL
  Filled 2014-10-11 (×2): qty 1

## 2014-10-11 MED ORDER — FLEET ENEMA 7-19 GM/118ML RE ENEM: 1.0000 | ENEMA | Freq: Every day | RECTAL | Status: DC | PRN

## 2014-10-11 MED ORDER — SENNA 8.6 MG PO TABS
1.0000 | ORAL_TABLET | Freq: Every day | ORAL | Status: DC
  Administered 2014-10-11 – 2014-10-12 (×2): 8.6 mg via ORAL
  Filled 2014-10-11 (×2): qty 1

## 2014-10-11 MED ORDER — BISACODYL 10 MG RE SUPP: 10.0000 mg | Freq: Every day | RECTAL | Status: DC | PRN

## 2014-10-11 NOTE — Progress Notes (Signed)
Physical Therapy Treatment Patient Details Name: Sandra [name] MRN: [id number] DOB: 1940-11-07 Today's Date: 10/11/2014    History of Present Illness Patient is a 74 yo female with prior lumbar fushion, presents now for L1-2 Posterior lumbar interbody fusion//Pedicle screws and rods laterally at T10-L2/Exploration of prior fusion L2-S1//Fusion at S1-Ileum, iliac bolts bilaterally//Aero.     PT Comments    Patient agreeable to mobilize now that she has been up to the bathroom. Ambulated in hall, continues to complain of increased pain with poor ability to manage. VCs for safety throughout session. Will continue to see and progress as tolerated.   Follow Up Recommendations  SNF     Equipment Recommendations  3in1 (PT)    Recommendations for Other Services       Precautions / Restrictions Precautions Precautions: Back;Fall Precaution Comments: educated regarding back precautions, wearing of brace, pain management Required Braces or Orthoses: Spinal Brace Spinal Brace: Thoracolumbosacral orthotic;Applied in supine position (per MD order) Restrictions Weight Bearing Restrictions: No    Mobility  Bed Mobility               General bed mobility comments: received on toilet  Transfers Overall transfer level: Needs assistance Equipment used: Rolling walker (2 wheeled) Transfers: Sit to/from Stand Sit to Stand: Min assist         General transfer comment: VCs for hand placement, performed from toilet and edge of bed after seated rest  Ambulation/Gait Ambulation/Gait assistance: Min guard Ambulation Distance (Feet): 140 Feet Assistive device: Rolling walker (2 wheeled) Gait Pattern/deviations: Step-to pattern Gait velocity: decreased Gait velocity interpretation: Below normal speed for age/gender General Gait Details: Heavy reliance on RW, one standing rest break and one seated rest break to adjust brace. Patient complains of poor pain management today.   Stairs            Wheelchair Mobility    Modified Rankin (Stroke Patients Only)       Balance     Sitting balance-Leahy Scale: Good     Standing balance support: Bilateral upper extremity supported Standing balance-Leahy Scale: Fair                      Cognition Arousal/Alertness: Awake/alert Behavior During Therapy: WFL for tasks assessed/performed Overall Cognitive Status: Within Functional Limits for tasks assessed                      Exercises      General Comments        Pertinent Vitals/Pain Pain Assessment: 0-10 Pain Score: 7  Pain Location: back Pain Descriptors / Indicators: Aching Pain Intervention(s): Limited activity within patient's tolerance;Monitored during session;Repositioned;Relaxation    Home Living                      Prior Function            PT Goals (current goals can now be found in the care plan section) Acute Rehab PT Goals Patient Stated Goal: to get her pain better PT Goal Formulation: With patient Time For Goal Achievement: 10/22/14 Potential to Achieve Goals: Good Progress towards PT goals: Progressing toward goals    Frequency  Min 5X/week    PT Plan Current plan remains appropriate    Co-evaluation             End of Session Equipment Utilized During Treatment: Gait belt;Back brace Activity Tolerance: Patient limited by pain Patient left: in chair;with call  bell/phone within reach;with chair alarm set;with family/visitor present     Time: 5400-8676 PT Time Calculation (min) (ACUTE ONLY): 20 min  Charges:  $Gait Training: 8-22 mins                    G CodesDuncan Dull Oct 29, 2014, 2:35 PM Alben Deeds, [place] DPT  [phone]

## 2014-10-11 NOTE — Progress Notes (Signed)
PT Cancellation Note  Patient Details Name: Sandra [name] MRN: [id number] DOB: 1941-06-29   Cancelled Treatment:    Reason Eval/Treat Not Completed: Pain limiting ability to participate; patient stating "i don't think we are going to do this today" discussed benefits of mobility, patient states that she is in too much pain. Will check back this afternoon to see if patient's desire to get up has changed.   Duncan Dull 10/11/2014, 10:56 AM Alben Deeds, PT DPT  [phone]

## 2014-10-11 NOTE — Clinical Social Work Note (Signed)
CSW reviewed chart. Once medically stable the pt will transition to [facility]. CSW will continue to follow the pt.   [place], MSW, [place]

## 2014-10-11 NOTE — Progress Notes (Signed)
Subjective: Patient reports "I just can't get this pain to go away"  Objective: Vital signs in last 24 hours: Temp:  [98.2 F (36.8 C)-98.6 F (37 C)] 98.2 F (36.8 C) (04/11 0511) Pulse Rate:  [77-88] 79 (04/11 0511) Resp:  [16-20] 16 (04/11 0511) BP: (90-115)/(46-57) 96/46 mmHg (04/11 0511) SpO2:  [96 %-100 %] 96 % (04/11 0511)  Intake/Output from previous day: 04/10 0701 - 04/11 0700 In: 480 [P.O.:480] Out: -  Intake/Output this shift:    Alert, ambulating from bathroom with walker in TLSO.  Good strength BLE. Incision without erythema,s welling, or drainage beneath honeycomb drsg. Reports persistent back pain, minimal right thigh pain- required IV Dilaudid all night. No relief with PO dilaudid or percocet. (Hx "crazy" with OxyContin). No BM yet. Voiding without difficulty.  Lab Results: No results for input(s): WBC, HGB, HCT, PLT in the last 72 hours. BMET No results for input(s): NA, K, CL, CO2, GLUCOSE, BUN, CREATININE, CALCIUM in the last 72 hours.  Studies/Results: No results found.  Assessment/Plan: Improving function, persistent pain   LOS: 4 days  Per DrStern, start Exalgo 8mg  q24hrs PO with Dilaudid 2-4mg  q3hrs prn breakthrough pain. Valium 2mg  TID prn spasm. Stop robaxin, oxycodone, and norco. Dulcolax suppository & Fleets enema prn. Will remove chest piece from TLSO.    Sandra [name] 10/11/2014, 8:23 AM

## 2014-10-12 ENCOUNTER — Non-Acute Institutional Stay (SKILLED_NURSING_FACILITY): Payer: Medicare Other | Admitting: Registered Nurse

## 2014-10-12 DIAGNOSIS — M549 Dorsalgia, unspecified: Secondary | ICD-10-CM

## 2014-10-12 DIAGNOSIS — Z720 Tobacco use: Secondary | ICD-10-CM | POA: Diagnosis not present

## 2014-10-12 DIAGNOSIS — R600 Localized edema: Secondary | ICD-10-CM

## 2014-10-12 DIAGNOSIS — R531 Weakness: Secondary | ICD-10-CM | POA: Diagnosis not present

## 2014-10-12 DIAGNOSIS — R278 Other lack of coordination: Secondary | ICD-10-CM | POA: Diagnosis not present

## 2014-10-12 DIAGNOSIS — Z4789 Encounter for other orthopedic aftercare: Secondary | ICD-10-CM | POA: Diagnosis not present

## 2014-10-12 DIAGNOSIS — E876 Hypokalemia: Secondary | ICD-10-CM | POA: Diagnosis not present

## 2014-10-12 DIAGNOSIS — D62 Acute posthemorrhagic anemia: Secondary | ICD-10-CM | POA: Diagnosis not present

## 2014-10-12 DIAGNOSIS — S32009K Unspecified fracture of unspecified lumbar vertebra, subsequent encounter for fracture with nonunion: Secondary | ICD-10-CM | POA: Diagnosis not present

## 2014-10-12 DIAGNOSIS — K5901 Slow transit constipation: Secondary | ICD-10-CM | POA: Diagnosis not present

## 2014-10-12 DIAGNOSIS — M4155 Other secondary scoliosis, thoracolumbar region: Secondary | ICD-10-CM | POA: Diagnosis not present

## 2014-10-12 DIAGNOSIS — G8929 Other chronic pain: Secondary | ICD-10-CM | POA: Diagnosis not present

## 2014-10-12 DIAGNOSIS — M792 Neuralgia and neuritis, unspecified: Secondary | ICD-10-CM | POA: Diagnosis not present

## 2014-10-12 DIAGNOSIS — K5900 Constipation, unspecified: Secondary | ICD-10-CM | POA: Diagnosis not present

## 2014-10-12 DIAGNOSIS — G4700 Insomnia, unspecified: Secondary | ICD-10-CM

## 2014-10-12 DIAGNOSIS — M4800 Spinal stenosis, site unspecified: Secondary | ICD-10-CM | POA: Diagnosis not present

## 2014-10-12 DIAGNOSIS — M6281 Muscle weakness (generalized): Secondary | ICD-10-CM | POA: Diagnosis not present

## 2014-10-12 DIAGNOSIS — N289 Disorder of kidney and ureter, unspecified: Secondary | ICD-10-CM | POA: Diagnosis not present

## 2014-10-12 DIAGNOSIS — T814XXS Infection following a procedure, sequela: Secondary | ICD-10-CM | POA: Diagnosis not present

## 2014-10-12 DIAGNOSIS — R2681 Unsteadiness on feet: Secondary | ICD-10-CM | POA: Diagnosis not present

## 2014-10-12 DIAGNOSIS — G629 Polyneuropathy, unspecified: Secondary | ICD-10-CM | POA: Diagnosis not present

## 2014-10-12 MED ORDER — HYDROMORPHONE HCL ER 12 MG PO T24A: 12.0000 mg | EXTENDED_RELEASE_TABLET | ORAL | Status: DC

## 2014-10-12 MED ORDER — HYDROMORPHONE HCL ER 8 MG PO T24A
8.0000 mg | EXTENDED_RELEASE_TABLET | Freq: Every day | ORAL | Status: DC
  Administered 2014-10-12: 8 mg via ORAL
  Filled 2014-10-12: qty 1

## 2014-10-12 NOTE — Progress Notes (Signed)
Subjective: Patient reports "I'm a whole lot better than yesterday, but I still had to take the pills for breakthrough"  Objective: Vital signs in last 24 hours: Temp:  [99 F (37.2 C)-100.2 F (37.9 C)] 99.1 F (37.3 C) (04/12 0615) Pulse Rate:  [75-88] 75 (04/12 0615) Resp:  [16-20] 18 (04/12 0615) BP: (92-99)/(40-56) 99/56 mmHg (04/12 0615) SpO2:  [93 %-99 %] 93 % (04/12 0615)  Intake/Output from previous day:   Intake/Output this shift:    Alert, conversant, in good spirits. Up in chair finishing breakfast. Micheal Likens found to be helpful last 24hrs, but 4mg  Dilaudid was still required ~q4hrs for breakthrough pain.  BM last night after suppository. Pt states that helped her overall comfort. Strength is good BLE. Walked with PT some yesterday.  Incision without erythema, swelling, or drainage beneath intact honeycomb drsg.   Lab Results: No results for input(s): WBC, HGB, HCT, PLT in the last 72 hours. BMET No results for input(s): NA, K, CL, CO2, GLUCOSE, BUN, CREATININE, CALCIUM in the last 72 hours.  Studies/Results: No results found.  Assessment/Plan: Improving   LOS: 5 days  Per Dr. Vertell Limber, will increase Exalgo to 12mg , continuing breakthrough Dilaudid prn. D/C IV.  D/C to [facility] when bed available. Pt agreeable to plan. May shower. Rx's to chart for Exalgo, Dilaudid, and Valium. Pt verbalizes understanding of d/c instructions and agrees to call office to schedule 3-4 week f/u appt.   Verdis Prime 10/12/2014, 7:50 AM

## 2014-10-12 NOTE — Discharge Summary (Signed)
Physician Discharge Summary  Patient ID: Sandra [name] MRN: [id number] DOB/AGE: 01/21/1941 74 y.o.  Admit date: 10/07/2014 Discharge date: 10/12/2014  Admission Diagnoses: Thoracolumbar Scoliosis, with post-junctional kyphosis, lumbosacral pseudoarthrosis, lumbago, radiculopathy, spinal stenosis   Discharge Diagnoses: Thoracolumbar Scoliosis, with post-junctional kyphosis, lumbosacral pseudoarthrosis, lumbago, radiculopathy, spinal stenosis s/p Exploration of Fusion, Decompression Lumbar one-three, Revision of Posterior Segmental Instrumentation Thoracic ten-Ilium with Posterior Lateral Arthrodesis (N/A) - L1-2 Decompression and fusion with exploration and extension of fusion//iliac fixation//Airo APPLICATION OF INTRAOPERATIVE CT SCAN (N/A)  Active Problems:   Other secondary scoliosis, thoracolumbar region   Discharged Condition: good  Hospital Course: Sandra [name] was admitted for surgery with dx scoliosis, radiculopathy, and pseudoarthrosis. Following uncomplicated exploration, decompression, and fusion T10-Iliac fusion, she recovered well. She progressed slowly, but steadily, limited by pain.   Consults: None  Significant Diagnostic Studies: radiology: X-Ray: intra-operative X-ray and CT  Treatments: surgery: Exploration of Fusion, Decompression Lumbar one-three, Revision of Posterior Segmental Instrumentation Thoracic ten-Ilium with Posterior Lateral Arthrodesis (N/A) - L1-2 Decompression and fusion with exploration and extension of fusion//iliac fixation//Airo APPLICATION OF INTRAOPERATIVE CT SCAN (N/A)   Discharge Exam: Blood pressure 99/56, pulse 75, temperature 99.1 F (37.3 C), temperature source Oral, resp. rate 18, height 5\' 2"  (1.575 m), weight 70.8 kg (156 lb 1.4 oz), SpO2 93 %. Alert, conversant, in good spirits. Up in chair finishing breakfast. Sandra [name] found to be helpful last 24hrs, but 4mg  Dilaudid was still required ~q4hrs for breakthrough pain.  BM last night after  suppository. Pt states that helped her overall comfort. Strength is good BLE. Walked with PT some yesterday.  Incision without erythema, swelling, or drainage beneath intact honeycomb drsg.    Disposition: Discharge to SNF - [facility]. Per Dr. Vertell Limber, will increase Exalgo to 12mg , continuing breakthrough Dilaudid prn.  May shower. Rx's to chart for Exalgo, Dilaudid, and Valium. Pt verbalizes understanding of d/c instructions and agrees to call office to schedule 3-4 week f/u appt. Do not resume Naproxen for 12 weeks post-op.       Medication List    ASK your doctor about these medications        aspirin 325 MG tablet  Take 325 mg by mouth daily.     buprenorphine 10 MCG/HR Ptwk patch  Commonly known as:  BUTRANS - dosed mcg/hr  Place 10 mcg onto the skin once a week.     CALCIUM + D PO  Take 1 tablet by mouth daily.     gabapentin 400 MG capsule  Commonly known as:  NEURONTIN  Take 400 mg by mouth 3 (three) times daily.     HM POTASSIUM 595 MG Tabs tablet  Generic drug:  potassium gluconate  Take 595 mg by mouth daily.     hydrOXYzine 25 MG tablet  Commonly known as:  ATARAX/VISTARIL  Take 1 tablet (25 mg total) by mouth at bedtime as needed (Sleep).     multivitamin with minerals Tabs tablet  Take 1 tablet by mouth daily.     naproxen 500 MG tablet  Commonly known as:  NAPROSYN  Take 500 mg by mouth 2 (two) times daily with a meal.     Oxycodone HCl 10 MG Tabs  Take 10 mg by mouth every 6 (six) hours as needed. For pain     Vitamin D 2000 UNITS Caps  Take 1 capsule by mouth daily.         Signed: Verdis Prime 10/12/2014, 8:01 AM

## 2014-10-12 NOTE — Progress Notes (Signed)
Patient is discharged to [facility] place,  Alert and oriented , neuro intact and honeycomb dressing at back is clean, dry and intact.

## 2014-10-13 ENCOUNTER — Encounter: Payer: Self-pay | Admitting: Registered Nurse

## 2014-10-13 NOTE — Progress Notes (Signed)
Patient ID: Sandra [name], female   DOB: 1940/11/19, 74 y.o.   MRN: [id number]   Place of Service: [facility]  Allergies  Allergen Reactions  . Clarithromycin Diarrhea  . Codeine Other (See Comments)    Headache  . Resinol [Dermatological Products, Misc.] Other (See Comments)    Skin was like "raw meat"..Skin peeled off.  . Sulfa Antibiotics Rash    Code Status: Full Code  Goals of Care: Longevity/STR  Chief Complaint  Patient presents with  . Hospitalization Follow-up    HPI Review of hospital records showed 74 y.o. female with PMH of chronic back pain, osteopenia, DVT, tobacco abuse among others is being seen for a follow-up visit post hospital admission from 10/07/14 to 10/12/14 for chronic lower back pain secondary to thoracolumbar scoliosis with post-junctional kyphosis, lumbosacral pseudoarthrosis, lumbago, radiculopathy, and  spinal stenosis. She underwent exploration of fusion, decompression L1-3, revision of posterior segmental instrumentation T10-Ilium with posterior lateral arthrodesis (N/A) - L1-2 decompression and fusion with exploration and extension of fusion//iliac fixation//Airo. She is here for short term rehab and her goal is to return home. Seen in room today. Reported having constant lower back pain 7/10, worse with movement and improve with pain medication and rest. Would like to have her pain med (dilaudid) now. Discharge med list from recent hospitalization did not include dilaudid. However, she has scripts for exalgo, dilaudid, and valium. Also stated she needs nicotine patch as she is a smoker. Called and spoke to Dr. Stern(10/13/14) for verification. Patient sated she does not want to take Valium as it "knocks" her out. Denies any other concerns.  Review of Systems Constitutional: Negative for fever, chills, and fatigue. HENT: Negative for ear pain, congestion, and sore throat Eyes: Negative for eye pain, eye discharge, and visual disturbance    Cardiovascular: Negative for chest pain, palpitations. Positive for leg swelling.  Respiratory: Negative cough, shortness of breath, and wheezing.  Gastrointestinal: Negative for nausea and vomiting. Negative for abdominal pain. Positive for constipation Genitourinary: Negative for  dysuria Endocrine: Negative for polydipsia, polyphagia, and polyuria Musculoskeletal: See HPI Neurological: Negative for dizziness and headache Skin: Negative for rash and itchiness Psychiatric: Negative for depression  Past Medical History  Diagnosis Date  . Complication of anesthesia     slow to wake up and combative in April 09/in Dec 09 she did much better  . History of bronchitis 5+yrs ago  . Headache     occasionally  . Weakness     numbness and tingling in both legs  . Arthritis     back   . Osteopenia   . Chronic back pain     scoliosis/degenerative disc  . Urinary urgency   . Cataracts, bilateral   . History of MRSA infection 2009  . DVT (deep venous thrombosis)     mid 80's    Past Surgical History  Procedure Laterality Date  . Appendectomy  1966  . Tubal ligation  1986  . Back surgery  2009/2010  . Bladder tacked   2009  . Abdominal hysterectomy  2009  . Colonoscopy      History  Substance Use Topics  . Smoking status: Current Every Day Smoker -- 1.00 packs/day for 50 years  . Smokeless tobacco: Not on file  . Alcohol Use: Yes     Comment: monthly    No family history on file.    Medication List       This list is accurate as of: 10/12/14 11:59  PM.  Always use your most recent med list.               aspirin 325 MG tablet  Take 325 mg by mouth daily.     buprenorphine 10 MCG/HR Ptwk patch  Commonly known as:  BUTRANS - dosed mcg/hr  Place 10 mcg onto the skin once a week.     CALCIUM + D PO  Take 1 tablet by mouth daily.     docusate sodium 100 MG capsule  Commonly known as:  COLACE  Take 100 mg by mouth 2 (two) times daily.     gabapentin 400 MG  capsule  Commonly known as:  NEURONTIN  Take 400 mg by mouth 3 (three) times daily.     HM POTASSIUM 595 MG Tabs tablet  Generic drug:  potassium gluconate  Take 595 mg by mouth daily.     HYDROmorphone HCl 12 MG T24a SR tablet  Commonly known as:  EXALGO  Take 12 mg by mouth daily.     HYDROmorphone 2 MG tablet  Commonly known as:  DILAUDID  Take 2-4 mg by mouth every 3 (three) hours as needed for moderate pain or severe pain.     hydrOXYzine 25 MG tablet  Commonly known as:  ATARAX/VISTARIL  Take 1 tablet (25 mg total) by mouth at bedtime as needed (Sleep).     multivitamin with minerals Tabs tablet  Take 1 tablet by mouth daily.     nicotine 14 mg/24hr patch  Commonly known as:  NICODERM CQ - dosed in mg/24 hours  Place 14 mg onto the skin daily.     Oxycodone HCl 10 MG Tabs  Take 10 mg by mouth every 6 (six) hours as needed. For pain     polyethylene glycol packet  Commonly known as:  MIRALAX / GLYCOLAX  Take 17 g by mouth daily. Hold for loose stool/diarrhea     sennosides-docusate sodium 8.6-50 MG tablet  Commonly known as:  SENOKOT-S  Take 2 tablets by mouth daily.     Vitamin D 2000 UNITS Caps  Take 1 capsule by mouth daily.        Physical Exam  BP 106/60 mmHg  Pulse 74  Temp(Src) 98.8 F (37.1 C)  Resp 16  Ht 5\' 2"  (1.575 m)  Constitutional: WDWN elderly female in no acute distress. Conversant and pleasant HEENT: Normocephalic and atraumatic. PERRL. EOM intact. No icterus. Oral mucosa moist. Posterior pharynx clear of any exudate or lesions. Neck: Supple and nontender. No lymphadenopathy, masses, or thyromegaly. No JVD or carotid bruits. Cardiac: Normal S1, S2. RRR without appreciable murmurs, rubs, or gallops. Distal pulses intact. 1+ pitting edema of BLE L>R Respiratory: Unlabored respiration. Breath sounds clear bilaterally without rales, rhonchi, or wheezes. GI: Audible bowel sounds in all quadrants. Soft, nontender, nondistended. No palpable  mass.  Musculoskeletal: able to move all extremities. Mid thoracic and lumbar spines tender to palpation. Surgical incision w/o drainage. No signs of infection noted. Honeycomb foam dressing in place.  Skin: Warm and dry.   Neurological: Alert and oriented to person, place, and time. No focal deficits.  Psychiatric: Judgment and insight adequate. Appropriate mood and affect.   Labs Reviewed  CBC Latest Ref Rng 09/30/2014 09/05/2013 09/05/2013  WBC 4.0 - 10.5 K/uL 8.4 - 8.2  Hemoglobin 12.0 - 15.0 g/dL 12.7 16.0(H) 15.7(H)  Hematocrit 36.0 - 46.0 % 38.7 47.0(H) 43.6  Platelets 150 - 400 K/uL 222 - 189    CMP Latest Ref  Rng 09/30/2014 09/05/2013 06/08/2009  Glucose 70 - 99 mg/dL 90 112(H) 102(H)  BUN 6 - 23 mg/dL 16 14 10   Creatinine 0.50 - 1.10 mg/dL 1.12(H) 1.00 0.87  Sodium 135 - 145 mmol/L 141 139 135  Potassium 3.5 - 5.1 mmol/L 4.7 3.9 4.4  Chloride 96 - 112 mmol/L 104 100 102  CO2 19 - 32 mmol/L 28 - 27  Calcium 8.4 - 10.5 mg/dL 9.8 - 8.6  Total Protein 6.0 - 8.3 g/dL - - -  Total Bilirubin 0.3 - 1.2 mg/dL - - -  Alkaline Phos 39 - 117 U/L - - -  AST 0 - 37 U/L - - -  ALT 0 - 35 U/L - - -   Diagnostic Studies Reviewed 09/30/14: CXR: No acute cardiopulmonary findings. Sigmoid scoliosis with lumbar fusion  07/09/14: CT Lumbar spine:  3 mm retrolisthesis at L1-L2 associated with a calcified central and rightward disc extrusion. Posterior element hypertrophy is also worse on the RIGHT. Progressive loss of interspace height, and RIGHT-sided endplate remodeling results and degenerative scoliosis convex LEFT of 20 degrees apex L2. BILATERAL RIGHT greater than LEFT L1 and L2 impingement.  Pseudarthrosis at L5-S1 with loss of interspace height and hypertrophic osseous change, with narrowing of the LEFT greater than RIGHT subarticular and foraminal zones.  07/09/14: CT Thoracic spine 1. Filling defect on the right side of the thecal sac at L1-L2. 2. Fusion hardware appears intact. 3. Mild  retrolisthesis at L1-L2 has increased since 2010 and suspect some instability at this level given disproportionate motion in flexion and extension.   Assessment & Plan 1. Other secondary scoliosis, thoracolumbar region  S/p exploration of fusion, decompression L1-3, revision of posterior segmental instrumentation T10-Ilium with posterior lateral arthrodesis (N/A) - L1-2 decompression and fusion with exploration and extension of fusion//iliac fixation//Airo. Continue Butrans 59mcg/hr patch once a weeky, exalgo 12mg  daily with dilaudid 2mg  1-2 tabs every 3 hrs as needed and oxycodone 10mg  every 6 hours as needed for breakthrough pain. Continue gabapentin 400mg  three times daily for neuropathy. Continue to f/u with neurosurg, Dr. Vertell Limber.  2. Chronic back pain See above, #1.   3. Insomnia Continue atarax 25mg  daily at bedtime at needed  4. Constipation, unspecified constipation type Start colace 100mg  twice daily, senokot-s 8.6/50mg  2 tabs twice daily, with miralax 17g daily for constipation. Hold for loose stool/diarrhea. Encourage hydration and ambulation as tolerated  5.Tobacco abuse Start nicotine patch 14mg /24hr patch daily. Encourage smoking cessation  6. Generalized weakness Continue to work with PT/OT for gait/strength/balance training to restore/maintain function. Fall risk precautions  7. Leg edema, bilateral Encourage elevated BLE while in bed. Knee high TED hose to BLE daily while awake. Continue to monitor.    Diagnostic Studies/Labs Ordered: cbc, bmp in 1 week  Time spent: >50 minutes with >50% of time on care coordination   Family/Staff Communication Plan of care discussed with patient and nursing staff. Patient and nursing staff verbalized understanding and agree with plan of care. No additional questions or concerns reported.    Arthur Holms, MSN, AGNP-C [facility] [address] [place], [place] [address] [phone] [8am-5pm] After hours: [phone]

## 2014-10-14 ENCOUNTER — Non-Acute Institutional Stay (SKILLED_NURSING_FACILITY): Payer: Medicare Other | Admitting: Internal Medicine

## 2014-10-14 DIAGNOSIS — M4155 Other secondary scoliosis, thoracolumbar region: Secondary | ICD-10-CM | POA: Diagnosis not present

## 2014-10-14 DIAGNOSIS — Z720 Tobacco use: Secondary | ICD-10-CM | POA: Diagnosis not present

## 2014-10-14 DIAGNOSIS — M792 Neuralgia and neuritis, unspecified: Secondary | ICD-10-CM

## 2014-10-14 DIAGNOSIS — N289 Disorder of kidney and ureter, unspecified: Secondary | ICD-10-CM

## 2014-10-14 DIAGNOSIS — K5901 Slow transit constipation: Secondary | ICD-10-CM

## 2014-10-14 NOTE — Progress Notes (Signed)
Patient ID: Sandra [name], female   DOB: 1941/06/02, 74 y.o.   MRN: [id number]     Facility: [facility]    PCP: Lilian Coma, MD  Code Status: full code  Allergies  Allergen Reactions  . Clarithromycin Diarrhea  . Codeine Other (See Comments)    Headache  . Resinol [Dermatological Products, Misc.] Other (See Comments)    Skin was like "raw meat"..Skin peeled off.  . Sulfa Antibiotics Rash    Chief Complaint  Patient presents with  . New Admit To SNF     HPI:  74 year old patient is here for short term rehabilitation post hospital admission from 10/07/14 to 10/12/14 for chronic lower back pain secondary to thoracolumbar scoliosis with post-junctional kyphosis, lumbosacral pseudoarthrosis, lumbago, radiculopathy and  spinal stenosis. She underwent exploration of fusion, decompression L1-3, revision of posterior segmental instrumentation T10-Ilium with posterior lateral arthrodesis. Her pain is under control with current regimen. She would like to have her oxycodone scheduled as she was taking at home and use other pain meds sparingly. She was constipated but her current bowel regimen has been helpful. She would like her miralax changed to prn only. No other concerns. She has PMH of chronic back pain, osteopenia, DVT. She is currently using nicotine patch  Review of Systems:  Constitutional: Negative for fever, chills, malaise/fatigue and diaphoresis.  HENT: Negative for headache, congestion Eyes: Negative for eye pain, blurred vision, double vision and discharge.  Respiratory: Negative for cough, shortness of breath and wheezing.   Cardiovascular: Negative for chest pain, palpitations, leg swelling.  Gastrointestinal: Negative for heartburn, nausea, vomiting, abdominal pain Genitourinary: Negative for dysuria, urgency, frequency Musculoskeletal: Negative for falls Skin: Negative for itching, rash.  Neurological: Negative for dizziness, tingling, focal  weakness Psychiatric/Behavioral: Negative for depression.    Past Medical History  Diagnosis Date  . Complication of anesthesia     slow to wake up and combative in April 09/in Dec 09 she did much better  . History of bronchitis 5+yrs ago  . Headache     occasionally  . Weakness     numbness and tingling in both legs  . Arthritis     back   . Osteopenia   . Chronic back pain     scoliosis/degenerative disc  . Urinary urgency   . Cataracts, bilateral   . History of MRSA infection 2009  . DVT (deep venous thrombosis)     mid 80's   Past Surgical History  Procedure Laterality Date  . Appendectomy  1966  . Tubal ligation  1986  . Back surgery  2009/2010  . Bladder tacked   2009  . Abdominal hysterectomy  2009  . Colonoscopy     Social History:   reports that she has been smoking.  She does not have any smokeless tobacco history on file. She reports that she drinks alcohol. She reports that she does not use illicit drugs.  No family history on file.  Medications: Patient's Medications  New Prescriptions   No medications on file  Previous Medications   ASPIRIN 325 MG TABLET    Take 325 mg by mouth daily.   BUPRENORPHINE (BUTRANS - DOSED MCG/HR) 10 MCG/HR PTWK PATCH    Place 10 mcg onto the skin once a week.   CALCIUM CARBONATE-VITAMIN D (CALCIUM + D PO)    Take 1 tablet by mouth daily.   CHOLECALCIFEROL (VITAMIN D) 2000 UNITS CAPS    Take 1 capsule by mouth daily.  DOCUSATE SODIUM (COLACE) 100 MG CAPSULE    Take 100 mg by mouth 2 (two) times daily.   GABAPENTIN (NEURONTIN) 400 MG CAPSULE    Take 400 mg by mouth 3 (three) times daily.   HYDROMORPHONE (DILAUDID) 2 MG TABLET    Take 2-4 mg by mouth every 3 (three) hours as needed for moderate pain or severe pain.   HYDROMORPHONE HCL (EXALGO) 12 MG T24A SR TABLET    Take 12 mg by mouth daily.   HYDROXYZINE (ATARAX/VISTARIL) 25 MG TABLET    Take 1 tablet (25 mg total) by mouth at bedtime as needed (Sleep).   MULTIPLE VITAMIN  (MULTIVITAMIN WITH MINERALS) TABS TABLET    Take 1 tablet by mouth daily.   NICOTINE (NICODERM CQ - DOSED IN MG/24 HOURS) 14 MG/24HR PATCH    Place 14 mg onto the skin daily.   OXYCODONE HCL 10 MG TABS    Take 10 mg by mouth every 6 (six) hours as needed. For pain   POLYETHYLENE GLYCOL (MIRALAX / GLYCOLAX) PACKET    Take 17 g by mouth daily. Hold for loose stool/diarrhea   POTASSIUM GLUCONATE (HM POTASSIUM) 595 MG TABS TABLET    Take 595 mg by mouth daily.   SENNOSIDES-DOCUSATE SODIUM (SENOKOT-S) 8.6-50 MG TABLET    Take 2 tablets by mouth daily.  Modified Medications   No medications on file  Discontinued Medications   No medications on file     Physical Exam: Filed Vitals:   10/14/14 1558  BP: 95/53  Pulse: 79  Temp: 97.1 F (36.2 C)  Resp: 16  SpO2: 95%   BP Readings from Last 3 Encounters:  10/14/14 95/53  10/12/14 106/60  07/09/14 119/72   General- elderly female, well built, in no acute distress Head- normocephalic, atraumatic Throat- moist mucus membrane Neck- no cervical lymphadenopathy Cardiovascular- normal s1,s2, no murmurs, palpable dorsalis pedis and radial pulses, left leg > right leg edema, 1+ Respiratory- bilateral clear to auscultation, no wheeze, no rhonchi, no crackles, no use of accessory muscles Abdomen- bowel sounds present, soft, non tender Musculoskeletal- able to move all 4 extremities, generalized weakness  Neurological- no focal deficit, aao x 3  Skin- warm and dry, surgical incision extending from thoracic region upto sacral area, healing well, open to air Psychiatry- normal mood and affect   Labs reviewed: Basic Metabolic Panel:  Recent Labs  09/30/14 1542  NA 141  K 4.7  CL 104  CO2 28  GLUCOSE 90  BUN 16  CREATININE 1.12*  CALCIUM 9.8   Liver Function Tests: No results for input(s): AST, ALT, ALKPHOS, BILITOT, PROT, ALBUMIN in the last 8760 hours. No results for input(s): LIPASE, AMYLASE in the last 8760 hours. No results for  input(s): AMMONIA in the last 8760 hours. CBC:  Recent Labs  09/30/14 1542  WBC 8.4  HGB 12.7  HCT 38.7  MCV 92.4  PLT 222    Assessment/Plan  Other secondary scoliosis, thoracolumbar region S/p exploration of fusion, decompression L1-3, revision of posterior segmental instrumentation T10-Ilium with posterior lateral arthrodesis. Continue Butrans 44mcg/hr patch once a weeky. Change her oxycodone to oxycodone 10mg  every 6 hours. D/c exalgo. Continue dilaudid 2 mg 1-2 tab q3h prn for breakthrough pain. Has f/u with neurosurgery. Will have patient work with PT/OT as tolerated to regain strength and restore function.  Fall precautions are in place.  Constipation Stable, continue colace 100mg  twice daily, senokot-s 8.6/50mg  2 tabs twice daily and change miralax to daily prn only per pt  request. Hydration encouraged  Tobacco abuse continue nicotine patch 14mg /24hr patch daily.   neuropathy In her left foot, controlled, Continue gabapentin 400mg  three times daily   Impaired renal function Recheck bmp   Goals of care: short term rehabilitation   Labs/tests ordered: cbc, bmp  Family/ staff Communication: reviewed care plan with patient and nursing supervisor    Blanchie Serve, MD  [facility] [phone] (Monday-Friday 8 am - 5 pm) [phone] (afterhours)

## 2014-10-15 ENCOUNTER — Other Ambulatory Visit: Payer: Self-pay

## 2014-10-15 MED ORDER — OXYCODONE HCL 10 MG PO TABS: 10.0000 mg | ORAL_TABLET | Freq: Four times a day (QID) | ORAL | Status: DC | PRN

## 2014-10-15 NOTE — Telephone Encounter (Signed)
Rx faxed to [facility] @ [phone], phone number [phone]  

## 2014-10-16 LAB — BASIC METABOLIC PANEL
BUN: 12 mg/dL (ref 4–21)
Creatinine: 0.8 mg/dL (ref 0.5–1.1)
GLUCOSE: 90 mg/dL
Potassium: 4.1 mmol/L (ref 3.4–5.3)
Sodium: 141 mmol/L (ref 137–147)

## 2014-10-18 ENCOUNTER — Encounter (HOSPITAL_COMMUNITY): Payer: Self-pay | Admitting: Neurosurgery

## 2014-10-19 LAB — CBC AND DIFFERENTIAL
HCT: 23 % — AB (ref 36–46)
Hemoglobin: 7.2 g/dL — AB (ref 12.0–16.0)
Platelets: 342 10*3/uL (ref 150–399)
WBC: 6.8 10*3/mL

## 2014-10-20 ENCOUNTER — Non-Acute Institutional Stay (SKILLED_NURSING_FACILITY): Payer: Medicare Other | Admitting: Registered Nurse

## 2014-10-20 ENCOUNTER — Encounter: Payer: Self-pay | Admitting: Registered Nurse

## 2014-10-20 ENCOUNTER — Other Ambulatory Visit: Payer: Self-pay | Admitting: *Deleted

## 2014-10-20 DIAGNOSIS — D62 Acute posthemorrhagic anemia: Secondary | ICD-10-CM | POA: Diagnosis not present

## 2014-10-20 MED ORDER — HYDROMORPHONE HCL 2 MG PO TABS: 2.0000 mg | ORAL_TABLET | ORAL | Status: DC | PRN

## 2014-10-20 NOTE — Progress Notes (Signed)
Patient ID: Sandra [name], female   DOB: 06-23-1941, 74 y.o.   MRN: [id number]   Place of Service: [facility]  Allergies  Allergen Reactions  . Clarithromycin Diarrhea  . Codeine Other (See Comments)    Headache  . Resinol [Dermatological Products, Misc.] Other (See Comments)    Skin was like "raw meat"..Skin peeled off.  . Sulfa Antibiotics Rash    Code Status: Full Code  Goals of Care: Longevity/STR  Chief Complaint  Patient presents with  . Acute Visit    anemia    HPI Review of hospital records showed 74 y.o. female with PMH of chronic back pain, osteopenia, DVT, tobacco abuse among others is being seen for an acute visit for the evaluation of anemia. Hgb dropped from 12.7 on 09/30/14 preop to 7.2 on 10/19/14. Hemodynamically stable. Seen in room today denies any signs of bleeding. Reported feeling fatigue, but believes it is due to lack of sleep. Denies any other concerns  Review of Systems Constitutional: Negative for fever, chills  Cardiovascular: Negative for chest pain, palpitations.  Respiratory: Negative cough, shortness of breath, and wheezing.  Gastrointestinal: Negative for nausea and vomiting. Negative for abdominal pain and bloody stool Genitourinary: Negative for hematuria Neurological: Negative for dizziness and headache  Past Medical History  Diagnosis Date  . Complication of anesthesia     slow to wake up and combative in April 09/in Dec 09 she did much better  . History of bronchitis 5+yrs ago  . Headache     occasionally  . Weakness     numbness and tingling in both legs  . Arthritis     back   . Osteopenia   . Chronic back pain     scoliosis/degenerative disc  . Urinary urgency   . Cataracts, bilateral   . History of MRSA infection 2009  . DVT (deep venous thrombosis)     mid 80's    Past Surgical History  Procedure Laterality Date  . Appendectomy  1966  . Tubal ligation  1986  . Back surgery  2009/2010  . Bladder tacked    2009  . Abdominal hysterectomy  2009  . Colonoscopy      History  Substance Use Topics  . Smoking status: Current Every Day Smoker -- 1.00 packs/day for 50 years  . Smokeless tobacco: Not on file  . Alcohol Use: Yes     Comment: monthly    No family history on file.    Medication List       This list is accurate as of: 10/20/14  8:36 PM.  Always use your most recent med list.               aspirin 325 MG tablet  Take 325 mg by mouth daily.     buprenorphine 10 MCG/HR Ptwk patch  Commonly known as:  BUTRANS - dosed mcg/hr  Place 10 mcg onto the skin once a week.     CALCIUM + D PO  Take 1 tablet by mouth daily.     docusate sodium 100 MG capsule  Commonly known as:  COLACE  Take 100 mg by mouth 2 (two) times daily.     gabapentin 400 MG capsule  Commonly known as:  NEURONTIN  Take 400 mg by mouth 3 (three) times daily.     HM POTASSIUM 595 MG Tabs tablet  Generic drug:  potassium gluconate  Take 595 mg by mouth daily.     HYDROmorphone HCl 12  MG T24a SR tablet  Commonly known as:  EXALGO  Take 12 mg by mouth daily.     HYDROmorphone 2 MG tablet  Commonly known as:  DILAUDID  Take 1-2 tablets (2-4 mg total) by mouth every 3 (three) hours as needed for moderate pain or severe pain.     hydrOXYzine 25 MG tablet  Commonly known as:  ATARAX/VISTARIL  Take 1 tablet (25 mg total) by mouth at bedtime as needed (Sleep).     multivitamin with minerals Tabs tablet  Take 1 tablet by mouth daily.     nicotine 14 mg/24hr patch  Commonly known as:  NICODERM CQ - dosed in mg/24 hours  Place 14 mg onto the skin daily.     Oxycodone HCl 10 MG Tabs  Take 1 tablet (10 mg total) by mouth every 6 (six) hours as needed. For pain     polyethylene glycol packet  Commonly known as:  MIRALAX / GLYCOLAX  Take 17 g by mouth daily. Hold for loose stool/diarrhea     sennosides-docusate sodium 8.6-50 MG tablet  Commonly known as:  SENOKOT-S  Take 2 tablets by mouth daily.       Vitamin D 2000 UNITS Caps  Take 1 capsule by mouth daily.        Physical Exam  BP 100/50 mmHg  Pulse 73  Temp(Src) 97.8 F (36.6 C)  Resp 18  SpO2 95%  Constitutional: WDWN elderly female in no acute distress. Conversant and pleasant HEENT: Normocephalic and atraumatic. PERRL. EOM intact. No icterus. Oral mucosa moist. Posterior pharynx clear of any exudate or lesions. Neck: Supple and nontender.  Cardiac: Normal S1, S2. RRR without appreciable murmurs, rubs, or gallops. Distal pulses intact. Respiratory: Unlabored respiration. Breath sounds clear bilaterally without rales, rhonchi, or wheezes. GI: Audible bowel sounds in all quadrants. Soft, nontender, nondistended. No palpable mass.  Musculoskeletal: able to move all extremities. Mid thoracic and lumbar spines tender to palpation. Surgical incision clean, dry, and w/o signs of infection. Back brace in place.  Skin: Warm and dry.   Neurological: Alert and oriented to person, place, and time. No focal deficits.  Psychiatric: Judgment and insight adequate. Appropriate mood and affect.   Labs Reviewed  CBC Latest Ref Rng 10/19/2014 09/30/2014 09/05/2013  WBC - 6.8 8.4 -  Hemoglobin 12.0 - 16.0 g/dL 7.2(A) 12.7 16.0(H)  Hematocrit 36 - 46 % 23(A) 38.7 47.0(H)  Platelets 150 - 399 K/L 342 222 -    CMP Latest Ref Rng 09/30/2014 09/05/2013 06/08/2009  Glucose 70 - 99 mg/dL 90 112(H) 102(H)  BUN 6 - 23 mg/dL 16 14 10   Creatinine 0.50 - 1.10 mg/dL 1.12(H) 1.00 0.87  Sodium 135 - 145 mmol/L 141 139 135  Potassium 3.5 - 5.1 mmol/L 4.7 3.9 4.4  Chloride 96 - 112 mmol/L 104 100 102  CO2 19 - 32 mmol/L 28 - 27  Calcium 8.4 - 10.5 mg/dL 9.8 - 8.6  Total Protein 6.0 - 8.3 g/dL - - -  Total Bilirubin 0.3 - 1.2 mg/dL - - -  Alkaline Phos 39 - 117 U/L - - -  AST 0 - 37 U/L - - -  ALT 0 - 35 U/L - - -   Diagnostic Studies Reviewed 09/30/14: CXR: No acute cardiopulmonary findings. Sigmoid scoliosis with lumbar fusion  07/09/14: CT Lumbar  spine:  3 mm retrolisthesis at L1-L2 associated with a calcified central and rightward disc extrusion. Posterior element hypertrophy is also worse on the RIGHT. Progressive loss  of interspace height, and RIGHT-sided endplate remodeling results and degenerative scoliosis convex LEFT of 20 degrees apex L2. BILATERAL RIGHT greater than LEFT L1 and L2 impingement.  Pseudarthrosis at L5-S1 with loss of interspace height and hypertrophic osseous change, with narrowing of the LEFT greater than RIGHT subarticular and foraminal zones.  07/09/14: CT Thoracic spine 1. Filling defect on the right side of the thecal sac at L1-L2. 2. Fusion hardware appears intact. 3. Mild retrolisthesis at L1-L2 has increased since 2010 and suspect some instability at this level given disproportionate motion in flexion and extension.  Assessment & Plan 1. Acute blood loss anemia Hemodynamically stable. Most likely postop. Recheck cbc. Hemoccult x3-nursing staff to report any positive findings. Continue to monitor her status.    Family/Staff Communication Plan of care discussed with patient and nursing staff. Patient and nursing staff verbalized understanding and agree with plan of care. No additional questions or concerns reported.    Arthur Holms, MSN, AGNP-C [facility] [address] [place], [place] [address] [phone] [8am-5pm] After hours: [phone]

## 2014-10-27 DIAGNOSIS — S32009K Unspecified fracture of unspecified lumbar vertebra, subsequent encounter for fracture with nonunion: Secondary | ICD-10-CM | POA: Diagnosis not present

## 2014-10-28 ENCOUNTER — Encounter: Payer: Self-pay | Admitting: Registered Nurse

## 2014-10-28 ENCOUNTER — Non-Acute Institutional Stay (SKILLED_NURSING_FACILITY): Payer: Medicare Other | Admitting: Registered Nurse

## 2014-10-28 DIAGNOSIS — D62 Acute posthemorrhagic anemia: Secondary | ICD-10-CM

## 2014-10-28 DIAGNOSIS — G8929 Other chronic pain: Secondary | ICD-10-CM

## 2014-10-28 DIAGNOSIS — K5900 Constipation, unspecified: Secondary | ICD-10-CM | POA: Diagnosis not present

## 2014-10-28 DIAGNOSIS — R531 Weakness: Secondary | ICD-10-CM

## 2014-10-28 DIAGNOSIS — T814XXS Infection following a procedure, sequela: Secondary | ICD-10-CM | POA: Diagnosis not present

## 2014-10-28 DIAGNOSIS — Z720 Tobacco use: Secondary | ICD-10-CM | POA: Diagnosis not present

## 2014-10-28 DIAGNOSIS — IMO0001 Reserved for inherently not codable concepts without codable children: Secondary | ICD-10-CM

## 2014-10-28 DIAGNOSIS — K5901 Slow transit constipation: Secondary | ICD-10-CM | POA: Diagnosis not present

## 2014-10-28 DIAGNOSIS — M4155 Other secondary scoliosis, thoracolumbar region: Secondary | ICD-10-CM

## 2014-10-28 DIAGNOSIS — G4700 Insomnia, unspecified: Secondary | ICD-10-CM | POA: Diagnosis not present

## 2014-10-28 DIAGNOSIS — M549 Dorsalgia, unspecified: Secondary | ICD-10-CM | POA: Diagnosis not present

## 2014-10-28 LAB — CBC AND DIFFERENTIAL
HCT: 24 % — AB (ref 36–46)
HEMOGLOBIN: 8 g/dL — AB (ref 12.0–16.0)
Platelets: 266 10*3/uL (ref 150–399)
WBC: 5.4 10*3/mL

## 2014-10-28 NOTE — Progress Notes (Signed)
Patient ID: Sandra [name], female   DOB: Apr 09, 1941, 74 y.o.   MRN: [id number]   Place of Service: [facility]  Allergies  Allergen Reactions  . Clarithromycin Diarrhea  . Codeine Other (See Comments)    Headache  . Resinol [Dermatological Products, Misc.] Other (See Comments)    Skin was like "raw meat"..Skin peeled off.  . Sulfa Antibiotics Rash    Code Status: Full Code  Goals of Care: Longevity/STR  Chief Complaint  Patient presents with  . Discharge Note    HPI 74 y.o. female with PMH of chronic back pain, osteopenia, DVT, tobacco abuse among others is being seen for a discharge visit post hospital admission from 10/07/14 to 10/12/14 for chronic lower back pain secondary to thoracolumbar scoliosis with post-junctional kyphosis, lumbosacral pseudoarthrosis, lumbago, radiculopathy, and  spinal stenosis. She underwent exploration of fusion, decompression L1-3, revision of posterior segmental instrumentation T10-Ilium with posterior lateral arthrodesis (N/A) - L1-2 decompression and fusion with exploration and extension of fusion/iliac fixation/Airo. She has worked well with therapy team and is ready to be discharged home with [facility] PT/OT and DME ([facility], 3-1). Seen in room today. Denies any concerns. Stated that she's leaving today instead of 10/30/14 and receives script for abx for surgical wound infection from neurosurgery.   Review of Systems Constitutional: Negative for fever, chills, and fatigue. HENT: Negative for ear pain, congestion, and sore throat Eyes: Negative for eye pain, eye discharge, and visual disturbance  Cardiovascular: Negative for chest pain, palpitations. Positive for leg swelling.  Respiratory: Negative cough, shortness of breath, and wheezing.  Gastrointestinal: Negative for nausea and vomiting. Negative for abdominal pain, constipation/diarrhea Genitourinary: Negative for  dysuria Endocrine: Negative for polydipsia, polyphagia, and  polyuria Musculoskeletal: See HPI Neurological: Negative for dizziness and headache Skin: Negative for rash and itchiness Psychiatric: Negative for depression  Past Medical History  Diagnosis Date  . Complication of anesthesia     slow to wake up and combative in April 09/in Dec 09 she did much better  . History of bronchitis 5+yrs ago  . Headache     occasionally  . Weakness     numbness and tingling in both legs  . Arthritis     back   . Osteopenia   . Chronic back pain     scoliosis/degenerative disc  . Urinary urgency   . Cataracts, bilateral   . History of MRSA infection 2009  . DVT (deep venous thrombosis)     mid 80's    Past Surgical History  Procedure Laterality Date  . Appendectomy  1966  . Tubal ligation  1986  . Back surgery  2009/2010  . Bladder tacked   2009  . Abdominal hysterectomy  2009  . Colonoscopy      History  Substance Use Topics  . Smoking status: Current Every Day Smoker -- 1.00 packs/day for 50 years  . Smokeless tobacco: Not on file  . Alcohol Use: Yes     Comment: monthly    No family history on file.    Medication List       This list is accurate as of: 10/28/14  3:19 PM.  Always use your most recent med list.               aspirin 325 MG tablet  Take 325 mg by mouth daily.     buprenorphine 10 MCG/HR Ptwk patch  Commonly known as:  BUTRANS - dosed mcg/hr  Place 10 mcg onto the skin  once a week.     CALCIUM + D PO  Take 1 tablet by mouth daily.     docusate sodium 100 MG capsule  Commonly known as:  COLACE  Take 100 mg by mouth 2 (two) times daily.     gabapentin 400 MG capsule  Commonly known as:  NEURONTIN  Take 400 mg by mouth 3 (three) times daily.     HM POTASSIUM 595 MG Tabs tablet  Generic drug:  potassium gluconate  Take 595 mg by mouth daily.     HYDROmorphone HCl 12 MG T24a SR tablet  Commonly known as:  EXALGO  Take 12 mg by mouth daily.     HYDROmorphone 2 MG tablet  Commonly known as:   DILAUDID  Take 1-2 tablets (2-4 mg total) by mouth every 3 (three) hours as needed for moderate pain or severe pain.     hydrOXYzine 25 MG tablet  Commonly known as:  ATARAX/VISTARIL  Take 1 tablet (25 mg total) by mouth at bedtime as needed (Sleep).     multivitamin with minerals Tabs tablet  Take 1 tablet by mouth daily.     nicotine 14 mg/24hr patch  Commonly known as:  NICODERM CQ - dosed in mg/24 hours  Place 14 mg onto the skin daily.     Oxycodone HCl 10 MG Tabs  Take 1 tablet (10 mg total) by mouth every 6 (six) hours as needed. For pain     polyethylene glycol packet  Commonly known as:  MIRALAX / GLYCOLAX  Take 17 g by mouth daily. Hold for loose stool/diarrhea     sennosides-docusate sodium 8.6-50 MG tablet  Commonly known as:  SENOKOT-S  Take 2 tablets by mouth daily.     Vitamin D 2000 UNITS Caps  Take 1 capsule by mouth daily.        Physical Exam  BP 125/90 mmHg  Pulse 71  Temp(Src) 97.7 F (36.5 C)  Resp 18  Ht 5\' 2"  (1.575 m)  Wt 142 lb 9.6 oz (64.683 kg)  BMI 26.08 kg/m2  SpO2 98%  Constitutional: WDWN elderly female in no acute distress. Conversant and pleasant HEENT: Normocephalic and atraumatic. PERRL. EOM intact. No icterus. Oral mucosa moist. Posterior pharynx clear of any exudate or lesions. Neck: Supple and nontender. No lymphadenopathy, masses, or thyromegaly. No JVD or carotid bruits. Cardiac: Normal S1, S2. RRR without appreciable murmurs, rubs, or gallops. Distal pulses intact. 1+ pitting edema of LLE Respiratory: Unlabored respiration. Breath sounds clear bilaterally without rales, rhonchi, or wheezes. GI: Audible bowel sounds in all quadrants. Soft, nontender, nondistended. No palpable mass.  Musculoskeletal: able to move all extremities. Mid thoracic and lumbar spines tender to palpation. Spine surgical wound with open areas (from where scabs fell off)-yellow wound bed, scant of amount so yellow drainage noted.  Periwound erythematous  and tender to touch.  Skin: Warm and dry.   Neurological: Alert and oriented to person, place, and time. No focal deficits.  Psychiatric: Judgment and insight adequate. Appropriate mood and affect.   Labs Reviewed  CBC Latest Ref Rng 10/28/2014 10/19/2014 09/30/2014  WBC - 5.4 6.8 8.4  Hemoglobin 12.0 - 16.0 g/dL 8.0(A) 7.2(A) 12.7  Hematocrit 36 - 46 % 24(A) 23(A) 38.7  Platelets 150 - 399 K/L 266 342 222    CMP Latest Ref Rng 10/16/2014 09/30/2014 09/05/2013  Glucose 70 - 99 mg/dL - 90 112(H)  BUN 4 - 21 mg/dL 12 16 14   Creatinine 0.5 - 1.1 mg/dL 0.8  1.12(H) 1.00  Sodium 137 - 147 mmol/L 141 141 139  Potassium 3.4 - 5.3 mmol/L 4.1 4.7 3.9  Chloride 96 - 112 mmol/L - 104 100  CO2 19 - 32 mmol/L - 28 -  Calcium 8.4 - 10.5 mg/dL - 9.8 -  Total Protein 6.0 - 8.3 g/dL - - -  Total Bilirubin 0.3 - 1.2 mg/dL - - -  Alkaline Phos 39 - 117 U/L - - -  AST 0 - 37 U/L - - -  ALT 0 - 35 U/L - - -   Diagnostic Studies Reviewed 09/30/14: CXR: No acute cardiopulmonary findings. Sigmoid scoliosis with lumbar fusion  07/09/14: CT Lumbar spine:  3 mm retrolisthesis at L1-L2 associated with a calcified central and rightward disc extrusion. Posterior element hypertrophy is also worse on the RIGHT. Progressive loss of interspace height, and RIGHT-sided endplate remodeling results and degenerative scoliosis convex LEFT of 20 degrees apex L2. BILATERAL RIGHT greater than LEFT L1 and L2 impingement.  Pseudarthrosis at L5-S1 with loss of interspace height and hypertrophic osseous change, with narrowing of the LEFT greater than RIGHT subarticular and foraminal zones.  07/09/14: CT Thoracic spine 1. Filling defect on the right side of the thecal sac at L1-L2. 2. Fusion hardware appears intact. 3. Mild retrolisthesis at L1-L2 has increased since 2010 and suspect some instability at this level given disproportionate motion in flexion and extension.   Assessment & Plan 1. Other secondary scoliosis,  thoracolumbar region  S/p exploration of fusion, decompression L1-3, revision of posterior segmental instrumentation T10-Ilium with posterior lateral arthrodesis (N/A) - L1-2 decompression and fusion with exploration and extension of fusion//iliac fixation//Airo. Continue Butrans 47mcg/hr patch once a weeky, exalgo 12mg  daily with dilaudid 2mg  1-2 tabs every 3 hrs as needed and oxycodone 10mg  every 6 hours as needed for breakthrough pain. Continue gabapentin 400mg  three times daily for neuropathy. Continue to f/u with Dr. Vertell Limber.  2. Chronic back pain See above, #1.   3. Insomnia On-going. Continue atarax 25mg  daily at bedtime at needed  4. Constipation, unspecified constipation type Stable. Continue colace 100mg  twice daily, senokot-s 8.6/50mg  2 tabs twice daily, with miralax 17g daily for constipation. Hold for loose stool/diarrhea. Encourage hydration and ambulation as tolerated  5.Tobacco abuse Continue nicotine patch 14mg /24hr patch daily. Encourage smoking cessation  6. Generalized weakness Continue to work with [facility] PT/OT for gait/strength/balance training to restore/maintain function. Fall risk precautions  7. Leg edema, left Encourage elevated legs while in bed. Recommended knee high TED hose to BLE daily while awake.   8. Surgical wound infection Continue and complete abx as prescribed by neurosurgery. Bactroban cream to wound twice daily x 5 days. Continue to f/u with neurosurgery  9. Acute blood loss anemia Stable. Most likely post op. Recommend iron supplement. Hemoccult positive x1-recommend f/u with GI as she has not had colonoscopy for a while.  Last hbg 8.0. PCP to monitor h&h.   Home health services: PT/OT DME required:[employer], 3-1 PCP follow-up: Dr. Yong Channel on 11/08/14 @1 :00pm 30-day supply of prescription medications provided (#30 dilaudid 2mg , #2 butrans patches, #20 Exalgo, #30 oxycodone 10mg  IR)  Family/Staff Communication Plan of care discussed with patient  and nursing staff. Patient and nursing staff verbalized understanding and agree with plan of care. No additional questions or concerns reported.    Arthur Holms, MSN, AGNP-C [facility] [address] [place], [place] [address] [phone] [8am-5pm] After hours: [phone]

## 2014-11-01 DIAGNOSIS — Z4789 Encounter for other orthopedic aftercare: Secondary | ICD-10-CM | POA: Diagnosis not present

## 2014-11-01 DIAGNOSIS — M4806 Spinal stenosis, lumbar region: Secondary | ICD-10-CM | POA: Diagnosis not present

## 2014-11-01 DIAGNOSIS — M545 Low back pain: Secondary | ICD-10-CM | POA: Diagnosis not present

## 2014-11-01 DIAGNOSIS — M6281 Muscle weakness (generalized): Secondary | ICD-10-CM | POA: Diagnosis not present

## 2014-11-01 DIAGNOSIS — M8588 Other specified disorders of bone density and structure, other site: Secondary | ICD-10-CM | POA: Diagnosis not present

## 2014-11-01 DIAGNOSIS — M4185 Other forms of scoliosis, thoracolumbar region: Secondary | ICD-10-CM | POA: Diagnosis not present

## 2014-11-04 DIAGNOSIS — M4185 Other forms of scoliosis, thoracolumbar region: Secondary | ICD-10-CM | POA: Diagnosis not present

## 2014-11-04 DIAGNOSIS — M8588 Other specified disorders of bone density and structure, other site: Secondary | ICD-10-CM | POA: Diagnosis not present

## 2014-11-04 DIAGNOSIS — M4806 Spinal stenosis, lumbar region: Secondary | ICD-10-CM | POA: Diagnosis not present

## 2014-11-04 DIAGNOSIS — Z4789 Encounter for other orthopedic aftercare: Secondary | ICD-10-CM | POA: Diagnosis not present

## 2014-11-04 DIAGNOSIS — M545 Low back pain: Secondary | ICD-10-CM | POA: Diagnosis not present

## 2014-11-04 DIAGNOSIS — M6281 Muscle weakness (generalized): Secondary | ICD-10-CM | POA: Diagnosis not present

## 2014-11-08 DIAGNOSIS — Z720 Tobacco use: Secondary | ICD-10-CM | POA: Diagnosis not present

## 2014-11-08 DIAGNOSIS — D539 Nutritional anemia, unspecified: Secondary | ICD-10-CM | POA: Diagnosis not present

## 2014-11-08 DIAGNOSIS — M961 Postlaminectomy syndrome, not elsewhere classified: Secondary | ICD-10-CM | POA: Diagnosis not present

## 2014-11-08 DIAGNOSIS — M4806 Spinal stenosis, lumbar region: Secondary | ICD-10-CM | POA: Diagnosis not present

## 2014-11-08 DIAGNOSIS — D649 Anemia, unspecified: Secondary | ICD-10-CM | POA: Diagnosis not present

## 2014-11-08 DIAGNOSIS — Z79899 Other long term (current) drug therapy: Secondary | ICD-10-CM | POA: Diagnosis not present

## 2014-11-08 DIAGNOSIS — E780 Pure hypercholesterolemia: Secondary | ICD-10-CM | POA: Diagnosis not present

## 2014-11-08 DIAGNOSIS — M6281 Muscle weakness (generalized): Secondary | ICD-10-CM | POA: Diagnosis not present

## 2014-11-08 DIAGNOSIS — N183 Chronic kidney disease, stage 3 (moderate): Secondary | ICD-10-CM | POA: Diagnosis not present

## 2014-11-08 DIAGNOSIS — M899 Disorder of bone, unspecified: Secondary | ICD-10-CM | POA: Diagnosis not present

## 2014-11-08 DIAGNOSIS — M8588 Other specified disorders of bone density and structure, other site: Secondary | ICD-10-CM | POA: Diagnosis not present

## 2014-11-08 DIAGNOSIS — E559 Vitamin D deficiency, unspecified: Secondary | ICD-10-CM | POA: Diagnosis not present

## 2014-11-08 DIAGNOSIS — M545 Low back pain: Secondary | ICD-10-CM | POA: Diagnosis not present

## 2014-11-08 DIAGNOSIS — L409 Psoriasis, unspecified: Secondary | ICD-10-CM | POA: Diagnosis not present

## 2014-11-08 DIAGNOSIS — R195 Other fecal abnormalities: Secondary | ICD-10-CM | POA: Diagnosis not present

## 2014-11-08 DIAGNOSIS — T8131XA Disruption of external operation (surgical) wound, not elsewhere classified, initial encounter: Secondary | ICD-10-CM | POA: Diagnosis not present

## 2014-11-08 DIAGNOSIS — M4185 Other forms of scoliosis, thoracolumbar region: Secondary | ICD-10-CM | POA: Diagnosis not present

## 2014-11-08 DIAGNOSIS — Z4789 Encounter for other orthopedic aftercare: Secondary | ICD-10-CM | POA: Diagnosis not present

## 2014-11-11 DIAGNOSIS — M6281 Muscle weakness (generalized): Secondary | ICD-10-CM | POA: Diagnosis not present

## 2014-11-11 DIAGNOSIS — Z4789 Encounter for other orthopedic aftercare: Secondary | ICD-10-CM | POA: Diagnosis not present

## 2014-11-11 DIAGNOSIS — M4185 Other forms of scoliosis, thoracolumbar region: Secondary | ICD-10-CM | POA: Diagnosis not present

## 2014-11-11 DIAGNOSIS — M4806 Spinal stenosis, lumbar region: Secondary | ICD-10-CM | POA: Diagnosis not present

## 2014-11-11 DIAGNOSIS — M8588 Other specified disorders of bone density and structure, other site: Secondary | ICD-10-CM | POA: Diagnosis not present

## 2014-11-11 DIAGNOSIS — M545 Low back pain: Secondary | ICD-10-CM | POA: Diagnosis not present

## 2014-11-16 DIAGNOSIS — M4806 Spinal stenosis, lumbar region: Secondary | ICD-10-CM | POA: Diagnosis not present

## 2014-11-16 DIAGNOSIS — M6281 Muscle weakness (generalized): Secondary | ICD-10-CM | POA: Diagnosis not present

## 2014-11-16 DIAGNOSIS — M4185 Other forms of scoliosis, thoracolumbar region: Secondary | ICD-10-CM | POA: Diagnosis not present

## 2014-11-16 DIAGNOSIS — M545 Low back pain: Secondary | ICD-10-CM | POA: Diagnosis not present

## 2014-11-16 DIAGNOSIS — M8588 Other specified disorders of bone density and structure, other site: Secondary | ICD-10-CM | POA: Diagnosis not present

## 2014-11-16 DIAGNOSIS — Z4789 Encounter for other orthopedic aftercare: Secondary | ICD-10-CM | POA: Diagnosis not present

## 2014-11-18 DIAGNOSIS — Z4789 Encounter for other orthopedic aftercare: Secondary | ICD-10-CM | POA: Diagnosis not present

## 2014-11-18 DIAGNOSIS — M6281 Muscle weakness (generalized): Secondary | ICD-10-CM | POA: Diagnosis not present

## 2014-11-18 DIAGNOSIS — M545 Low back pain: Secondary | ICD-10-CM | POA: Diagnosis not present

## 2014-11-18 DIAGNOSIS — M4806 Spinal stenosis, lumbar region: Secondary | ICD-10-CM | POA: Diagnosis not present

## 2014-11-18 DIAGNOSIS — M4185 Other forms of scoliosis, thoracolumbar region: Secondary | ICD-10-CM | POA: Diagnosis not present

## 2014-11-18 DIAGNOSIS — M8588 Other specified disorders of bone density and structure, other site: Secondary | ICD-10-CM | POA: Diagnosis not present

## 2014-11-19 DIAGNOSIS — M76822 Posterior tibial tendinitis, left leg: Secondary | ICD-10-CM | POA: Diagnosis not present

## 2014-11-19 DIAGNOSIS — M76821 Posterior tibial tendinitis, right leg: Secondary | ICD-10-CM | POA: Diagnosis not present

## 2014-11-22 DIAGNOSIS — R195 Other fecal abnormalities: Secondary | ICD-10-CM | POA: Diagnosis not present

## 2014-12-13 DIAGNOSIS — Z79899 Other long term (current) drug therapy: Secondary | ICD-10-CM | POA: Diagnosis not present

## 2014-12-13 DIAGNOSIS — Z23 Encounter for immunization: Secondary | ICD-10-CM | POA: Diagnosis not present

## 2014-12-13 DIAGNOSIS — E559 Vitamin D deficiency, unspecified: Secondary | ICD-10-CM | POA: Diagnosis not present

## 2014-12-13 DIAGNOSIS — Z0000 Encounter for general adult medical examination without abnormal findings: Secondary | ICD-10-CM | POA: Diagnosis not present

## 2014-12-13 DIAGNOSIS — D500 Iron deficiency anemia secondary to blood loss (chronic): Secondary | ICD-10-CM | POA: Diagnosis not present

## 2014-12-13 DIAGNOSIS — Z131 Encounter for screening for diabetes mellitus: Secondary | ICD-10-CM | POA: Diagnosis not present

## 2014-12-13 DIAGNOSIS — E780 Pure hypercholesterolemia: Secondary | ICD-10-CM | POA: Diagnosis not present

## 2014-12-13 DIAGNOSIS — M8588 Other specified disorders of bone density and structure, other site: Secondary | ICD-10-CM | POA: Diagnosis not present

## 2014-12-13 DIAGNOSIS — D692 Other nonthrombocytopenic purpura: Secondary | ICD-10-CM | POA: Diagnosis not present

## 2014-12-28 DIAGNOSIS — M961 Postlaminectomy syndrome, not elsewhere classified: Secondary | ICD-10-CM | POA: Diagnosis not present

## 2014-12-28 DIAGNOSIS — M4126 Other idiopathic scoliosis, lumbar region: Secondary | ICD-10-CM | POA: Diagnosis not present

## 2014-12-28 DIAGNOSIS — M4120 Other idiopathic scoliosis, site unspecified: Secondary | ICD-10-CM | POA: Diagnosis not present

## 2014-12-28 DIAGNOSIS — M4316 Spondylolisthesis, lumbar region: Secondary | ICD-10-CM | POA: Diagnosis not present

## 2015-01-12 DIAGNOSIS — M5416 Radiculopathy, lumbar region: Secondary | ICD-10-CM | POA: Diagnosis not present

## 2015-01-20 ENCOUNTER — Other Ambulatory Visit: Payer: Self-pay

## 2015-01-20 DIAGNOSIS — Z1231 Encounter for screening mammogram for malignant neoplasm of breast: Secondary | ICD-10-CM

## 2015-02-04 DIAGNOSIS — Q438 Other specified congenital malformations of intestine: Secondary | ICD-10-CM | POA: Diagnosis not present

## 2015-02-04 DIAGNOSIS — R195 Other fecal abnormalities: Secondary | ICD-10-CM | POA: Diagnosis not present

## 2015-02-18 DIAGNOSIS — M76822 Posterior tibial tendinitis, left leg: Secondary | ICD-10-CM | POA: Diagnosis not present

## 2015-02-18 DIAGNOSIS — M76821 Posterior tibial tendinitis, right leg: Secondary | ICD-10-CM | POA: Diagnosis not present

## 2015-02-25 ENCOUNTER — Emergency Department (HOSPITAL_COMMUNITY): Payer: Medicare Other

## 2015-02-25 ENCOUNTER — Emergency Department (HOSPITAL_COMMUNITY)
Admission: EM | Admit: 2015-02-25 | Discharge: 2015-02-25 | Disposition: A | Discharge status: Home / Self Care | Payer: Medicare Other | Attending: Emergency Medicine | Admitting: Emergency Medicine

## 2015-02-25 ENCOUNTER — Encounter (HOSPITAL_COMMUNITY): Payer: Self-pay

## 2015-02-25 DIAGNOSIS — D649 Anemia, unspecified: Secondary | ICD-10-CM | POA: Insufficient documentation

## 2015-02-25 DIAGNOSIS — N281 Cyst of kidney, acquired: Secondary | ICD-10-CM | POA: Diagnosis not present

## 2015-02-25 DIAGNOSIS — R1013 Epigastric pain: Secondary | ICD-10-CM | POA: Diagnosis not present

## 2015-02-25 DIAGNOSIS — Z86718 Personal history of other venous thrombosis and embolism: Secondary | ICD-10-CM | POA: Insufficient documentation

## 2015-02-25 DIAGNOSIS — M1990 Unspecified osteoarthritis, unspecified site: Secondary | ICD-10-CM | POA: Insufficient documentation

## 2015-02-25 DIAGNOSIS — G8929 Other chronic pain: Secondary | ICD-10-CM | POA: Diagnosis not present

## 2015-02-25 DIAGNOSIS — Z7982 Long term (current) use of aspirin: Secondary | ICD-10-CM | POA: Insufficient documentation

## 2015-02-25 DIAGNOSIS — Z9049 Acquired absence of other specified parts of digestive tract: Secondary | ICD-10-CM | POA: Diagnosis not present

## 2015-02-25 DIAGNOSIS — Z90710 Acquired absence of both cervix and uterus: Secondary | ICD-10-CM | POA: Insufficient documentation

## 2015-02-25 DIAGNOSIS — Z79899 Other long term (current) drug therapy: Secondary | ICD-10-CM | POA: Diagnosis not present

## 2015-02-25 DIAGNOSIS — Z9851 Tubal ligation status: Secondary | ICD-10-CM | POA: Insufficient documentation

## 2015-02-25 DIAGNOSIS — R109 Unspecified abdominal pain: Secondary | ICD-10-CM | POA: Diagnosis present

## 2015-02-25 DIAGNOSIS — M8580 Other specified disorders of bone density and structure, unspecified site: Secondary | ICD-10-CM | POA: Diagnosis not present

## 2015-02-25 DIAGNOSIS — Z8614 Personal history of Methicillin resistant Staphylococcus aureus infection: Secondary | ICD-10-CM | POA: Insufficient documentation

## 2015-02-25 DIAGNOSIS — R1012 Left upper quadrant pain: Secondary | ICD-10-CM | POA: Diagnosis not present

## 2015-02-25 DIAGNOSIS — Z720 Tobacco use: Secondary | ICD-10-CM | POA: Insufficient documentation

## 2015-02-25 LAB — CBC WITH DIFFERENTIAL/PLATELET
BASOS PCT: 0 % (ref 0–1)
Basophils Absolute: 0 10*3/uL (ref 0.0–0.1)
EOS ABS: 0.2 10*3/uL (ref 0.0–0.7)
Eosinophils Relative: 3 % (ref 0–5)
HCT: 39.2 % (ref 36.0–46.0)
Hemoglobin: 12.6 g/dL (ref 12.0–15.0)
Lymphocytes Relative: 50 % — ABNORMAL HIGH (ref 12–46)
Lymphs Abs: 4 10*3/uL (ref 0.7–4.0)
MCH: 27.8 pg (ref 26.0–34.0)
MCHC: 32.1 g/dL (ref 30.0–36.0)
MCV: 86.3 fL (ref 78.0–100.0)
MONO ABS: 0.7 10*3/uL (ref 0.1–1.0)
Monocytes Relative: 8 % (ref 3–12)
NEUTROS ABS: 3.1 10*3/uL (ref 1.7–7.7)
Neutrophils Relative %: 39 % — ABNORMAL LOW (ref 43–77)
Platelets: 203 10*3/uL (ref 150–400)
RBC: 4.54 MIL/uL (ref 3.87–5.11)
RDW: 16.4 % — AB (ref 11.5–15.5)
WBC: 8 10*3/uL (ref 4.0–10.5)

## 2015-02-25 LAB — URINE MICROSCOPIC-ADD ON

## 2015-02-25 LAB — COMPREHENSIVE METABOLIC PANEL
ALBUMIN: 4.3 g/dL (ref 3.5–5.0)
ALT: 19 U/L (ref 14–54)
ANION GAP: 8 (ref 5–15)
AST: 31 U/L (ref 15–41)
Alkaline Phosphatase: 70 U/L (ref 38–126)
BILIRUBIN TOTAL: 0.3 mg/dL (ref 0.3–1.2)
BUN: 27 mg/dL — AB (ref 6–20)
CO2: 26 mmol/L (ref 22–32)
Calcium: 9.5 mg/dL (ref 8.9–10.3)
Chloride: 104 mmol/L (ref 101–111)
Creatinine, Ser: 1.19 mg/dL — ABNORMAL HIGH (ref 0.44–1.00)
GFR calc Af Amer: 52 mL/min — ABNORMAL LOW (ref >60)
GFR calc non Af Amer: 45 mL/min — ABNORMAL LOW (ref >60)
GLUCOSE: 94 mg/dL (ref 65–99)
Potassium: 4.1 mmol/L (ref 3.5–5.1)
Sodium: 138 mmol/L (ref 135–145)
TOTAL PROTEIN: 7.6 g/dL (ref 6.5–8.1)

## 2015-02-25 LAB — LIPASE, BLOOD: Lipase: 36 U/L (ref 22–51)

## 2015-02-25 LAB — URINALYSIS, ROUTINE W REFLEX MICROSCOPIC
Bilirubin Urine: NEGATIVE
Glucose, UA: NEGATIVE mg/dL
Ketones, ur: NEGATIVE mg/dL
Nitrite: NEGATIVE
PROTEIN: NEGATIVE mg/dL
Specific Gravity, Urine: 1.016 (ref 1.005–1.030)
UROBILINOGEN UA: 0.2 mg/dL (ref 0.0–1.0)
pH: 6 (ref 5.0–8.0)

## 2015-02-25 LAB — I-STAT CG4 LACTIC ACID, ED: Lactic Acid, Venous: 0.45 mmol/L — ABNORMAL LOW (ref 0.5–2.0)

## 2015-02-25 MED ORDER — IOHEXOL 300 MG/ML  SOLN
100.0000 mL | Freq: Once | INTRAMUSCULAR | Status: AC | PRN
  Administered 2015-02-25: 100 mL via INTRAVENOUS

## 2015-02-25 MED ORDER — DICYCLOMINE HCL 20 MG PO TABS: 20.0000 mg | ORAL_TABLET | Freq: Two times a day (BID) | ORAL | Status: DC

## 2015-02-25 MED ORDER — OXYCODONE-ACETAMINOPHEN 5-325 MG PO TABS
2.0000 | ORAL_TABLET | Freq: Once | ORAL | Status: AC
  Administered 2015-02-25: 2 via ORAL
  Filled 2015-02-25: qty 2

## 2015-02-25 MED ORDER — SODIUM CHLORIDE 0.9 % IV SOLN
Freq: Once | INTRAVENOUS | Status: AC
  Administered 2015-02-25: 19:00:00 via INTRAVENOUS

## 2015-02-25 MED ORDER — IOHEXOL 300 MG/ML  SOLN
25.0000 mL | Freq: Once | INTRAMUSCULAR | Status: DC | PRN
  Administered 2015-02-25: 25 mL via ORAL
  Filled 2015-02-25: qty 30

## 2015-02-25 MED ORDER — SODIUM CHLORIDE 0.9 % IV BOLUS (SEPSIS)
500.0000 mL | Freq: Once | INTRAVENOUS | Status: AC
  Administered 2015-02-25: 20:00:00 via INTRAVENOUS

## 2015-02-25 NOTE — ED Notes (Signed)
Bed: WA17 Expected date:  Expected time:  Means of arrival:  Comments: Hold for possible perf

## 2015-02-25 NOTE — Discharge Instructions (Signed)

## 2015-02-25 NOTE — ED Notes (Signed)
Patient reports having a colonoscopy 3 weeks ago. Patient states3 days ago she began having increased abdominal pain and bloating. Patient was sent by PCP to evaluate for possible bowel perforation.

## 2015-02-25 NOTE — ED Notes (Signed)
Patient transported to CT 

## 2015-02-25 NOTE — ED Notes (Signed)
Pt returned from CT °

## 2015-02-25 NOTE — ED Provider Notes (Signed)
CSN: [id number]     Arrival date & time 02/25/15  1800 History   First MD Initiated Contact with Patient 02/25/15 1804     No chief complaint on file.    (Consider location/radiation/quality/duration/timing/severity/associated sxs/prior Treatment) HPI Comments: Patient presents to emergency department for evaluation of abdominal pain. Patient reports that she recently was diagnosed with anemia and had heme positive stools, underwent colonoscopy as a result of this 3 weeks ago. Patient has been experiencing abdominal pain since colonoscopy. Patient reports that the pain progressively worsened over the last couple of weeks. She has not had any vomiting. There is no fever. Patient denies diarrhea, constipation, rectal bleeding. She saw her doctor today for recheck was sent to the ER for CAT scan to rule out perforation.   Past Medical History  Diagnosis Date  . Complication of anesthesia     slow to wake up and combative in April 09/in Dec 09 she did much better  . History of bronchitis 5+yrs ago  . Headache     occasionally  . Weakness     numbness and tingling in both legs  . Arthritis     back   . Osteopenia   . Chronic back pain     scoliosis/degenerative disc  . Urinary urgency   . Cataracts, bilateral   . History of MRSA infection 2009  . DVT (deep venous thrombosis)     mid 80's   Past Surgical History  Procedure Laterality Date  . Appendectomy  1966  . Tubal ligation  1986  . Back surgery  2009/2010  . Bladder tacked   2009  . Abdominal hysterectomy  2009  . Colonoscopy     No family history on file. Social History  Substance Use Topics  . Smoking status: Current Every Day Smoker -- 1.00 packs/day for 50 years  . Smokeless tobacco: Not on file  . Alcohol Use: Yes     Comment: monthly   OB History    No data available     Review of Systems  Gastrointestinal: Positive for abdominal pain.  All other systems reviewed and are negative.     Allergies   Clarithromycin; Codeine; Resinol; and Sulfa antibiotics  Home Medications   Prior to Admission medications   Medication Sig Start Date End Date Taking? Authorizing Provider  aspirin 325 MG tablet Take 325 mg by mouth daily.    Historical Provider, MD  buprenorphine (BUTRANS - DOSED MCG/HR) 10 MCG/HR PTWK patch Place 10 mcg onto the skin once a week.    Historical Provider, MD  Calcium Carbonate-Vitamin D (CALCIUM + D PO) Take 1 tablet by mouth daily.    Historical Provider, MD  Cholecalciferol (VITAMIN D) 2000 UNITS CAPS Take 1 capsule by mouth daily.    Historical Provider, MD  docusate sodium (COLACE) 100 MG capsule Take 100 mg by mouth 2 (two) times daily.    Historical Provider, MD  gabapentin (NEURONTIN) 400 MG capsule Take 400 mg by mouth 3 (three) times daily.    Historical Provider, MD  HYDROmorphone (DILAUDID) 2 MG tablet Take 1-2 tablets (2-4 mg total) by mouth every 3 (three) hours as needed for moderate pain or severe pain. 10/20/14   Gildardo Cranker, DO  HYDROmorphone HCl (EXALGO) 12 MG T24A SR tablet Take 12 mg by mouth daily.    Historical Provider, MD  hydrOXYzine (ATARAX/VISTARIL) 25 MG tablet Take 1 tablet (25 mg total) by mouth at bedtime as needed (Sleep). 09/05/13   Teressa Lower, MD  Multiple Vitamin (MULTIVITAMIN WITH MINERALS) TABS tablet Take 1 tablet by mouth daily.    Historical Provider, MD  nicotine (NICODERM CQ - DOSED IN MG/24 HOURS) 14 mg/24hr patch Place 14 mg onto the skin daily.    Historical Provider, MD  Oxycodone HCl 10 MG TABS Take 1 tablet (10 mg total) by mouth every 6 (six) hours as needed. For pain 10/15/14   Gildardo Cranker, DO  polyethylene glycol [facility] / GLYCOLAX) packet Take 17 g by mouth daily. Hold for loose stool/diarrhea    Historical Provider, MD  potassium gluconate (HM POTASSIUM) 595 MG TABS tablet Take 595 mg by mouth daily.    Historical Provider, MD  sennosides-docusate sodium (SENOKOT-S) 8.6-50 MG tablet Take 2 tablets by mouth daily.     Historical Provider, MD   BP 153/73 mmHg  Pulse 60  Temp(Src) 98.3 F (36.8 C) (Oral)  Resp 16  SpO2 97% Physical Exam  Constitutional: She is oriented to person, place, and time. She appears well-developed and well-nourished. No distress.  HENT:  Head: Normocephalic and atraumatic.  Right Ear: Hearing normal.  Left Ear: Hearing normal.  Nose: Nose normal.  Mouth/Throat: Oropharynx is clear and moist and mucous membranes are normal.  Eyes: Conjunctivae and EOM are normal. Pupils are equal, round, and reactive to light.  Neck: Normal range of motion. Neck supple.  Cardiovascular: Regular rhythm, S1 normal and S2 normal.  Exam reveals no gallop and no friction rub.   No murmur heard. Pulmonary/Chest: Effort normal and breath sounds normal. No respiratory distress. She exhibits no tenderness.  Abdominal: Soft. Normal appearance and bowel sounds are normal. There is no hepatosplenomegaly. There is tenderness in the epigastric area and left upper quadrant. There is no rebound, no guarding, no tenderness at McBurney's point and negative Murphy's sign. No hernia.  Musculoskeletal: Normal range of motion.  Neurological: She is alert and oriented to person, place, and time. She has normal strength. No cranial nerve deficit or sensory deficit. Coordination normal. GCS eye subscore is 4. GCS verbal subscore is 5. GCS motor subscore is 6.  Skin: Skin is warm, dry and intact. No rash noted. No cyanosis.  Psychiatric: She has a normal mood and affect. Her speech is normal and behavior is normal. Thought content normal.  Nursing note and vitals reviewed.   ED Course  Procedures (including critical care time) Labs Review Labs Reviewed  CBC WITH DIFFERENTIAL/PLATELET  COMPREHENSIVE METABOLIC PANEL  LIPASE, BLOOD  URINALYSIS, ROUTINE W REFLEX MICROSCOPIC (NOT AT [facility])  I-STAT CG4 LACTIC ACID, ED    Imaging Review No results found. I have personally reviewed and evaluated these images and lab  results as part of my medical decision-making.   EKG Interpretation None      MDM   Final diagnoses:  None   abdominal pain  Patient presents to the emergency department for evaluation of abdominal pain. Patient reports that she had colonoscopy performed 3 weeks ago. Since the colonoscopy she had progressively worsening abdominal pain, abdominal bloating. Her doctor sent her here today to have a CAT scan to rule out perforation from the procedure. CAT scan was performed. No acute complications or abnormalities are noted. Blood work is normal other than mildly elevated BUN and creatinine. Patient administered IV fluid bolus. She will be discharged with symptom management treatment, follow-up with PCP and gastroenterology.    Orpah Greek, MD 02/25/15 2131

## 2015-03-04 ENCOUNTER — Ambulatory Visit: Payer: No Typology Code available for payment source

## 2015-03-14 DIAGNOSIS — M4126 Other idiopathic scoliosis, lumbar region: Secondary | ICD-10-CM | POA: Diagnosis not present

## 2015-03-14 DIAGNOSIS — M545 Low back pain: Secondary | ICD-10-CM | POA: Diagnosis not present

## 2015-04-01 DIAGNOSIS — H04123 Dry eye syndrome of bilateral lacrimal glands: Secondary | ICD-10-CM | POA: Diagnosis not present

## 2015-04-01 DIAGNOSIS — H25813 Combined forms of age-related cataract, bilateral: Secondary | ICD-10-CM | POA: Diagnosis not present

## 2015-04-01 DIAGNOSIS — H43391 Other vitreous opacities, right eye: Secondary | ICD-10-CM | POA: Diagnosis not present

## 2015-04-08 ENCOUNTER — Ambulatory Visit
Admission: RE | Admit: 2015-04-08 | Discharge: 2015-04-08 | Disposition: A | Discharge status: Home / Self Care | Payer: Medicare Other | Source: Ambulatory Visit

## 2015-04-08 DIAGNOSIS — Z1231 Encounter for screening mammogram for malignant neoplasm of breast: Secondary | ICD-10-CM | POA: Diagnosis not present

## 2015-04-15 DIAGNOSIS — M5416 Radiculopathy, lumbar region: Secondary | ICD-10-CM | POA: Diagnosis not present

## 2015-04-15 DIAGNOSIS — M4126 Other idiopathic scoliosis, lumbar region: Secondary | ICD-10-CM | POA: Diagnosis not present

## 2015-04-15 DIAGNOSIS — M545 Low back pain: Secondary | ICD-10-CM | POA: Diagnosis not present

## 2015-04-15 DIAGNOSIS — M961 Postlaminectomy syndrome, not elsewhere classified: Secondary | ICD-10-CM | POA: Diagnosis not present

## 2015-04-15 DIAGNOSIS — M4120 Other idiopathic scoliosis, site unspecified: Secondary | ICD-10-CM | POA: Diagnosis not present

## 2015-04-15 DIAGNOSIS — Z6825 Body mass index (BMI) 25.0-25.9, adult: Secondary | ICD-10-CM | POA: Diagnosis not present

## 2015-05-02 DIAGNOSIS — H25813 Combined forms of age-related cataract, bilateral: Secondary | ICD-10-CM | POA: Diagnosis not present

## 2015-05-02 DIAGNOSIS — H25812 Combined forms of age-related cataract, left eye: Secondary | ICD-10-CM | POA: Diagnosis not present

## 2015-05-30 DIAGNOSIS — H2511 Age-related nuclear cataract, right eye: Secondary | ICD-10-CM | POA: Diagnosis not present

## 2015-05-30 DIAGNOSIS — H25811 Combined forms of age-related cataract, right eye: Secondary | ICD-10-CM | POA: Diagnosis not present

## 2015-07-08 DIAGNOSIS — M545 Low back pain: Secondary | ICD-10-CM | POA: Diagnosis not present

## 2015-07-08 DIAGNOSIS — M4126 Other idiopathic scoliosis, lumbar region: Secondary | ICD-10-CM | POA: Diagnosis not present

## 2015-07-08 DIAGNOSIS — M4120 Other idiopathic scoliosis, site unspecified: Secondary | ICD-10-CM | POA: Diagnosis not present

## 2015-07-08 DIAGNOSIS — M5416 Radiculopathy, lumbar region: Secondary | ICD-10-CM | POA: Diagnosis not present

## 2015-07-08 DIAGNOSIS — M961 Postlaminectomy syndrome, not elsewhere classified: Secondary | ICD-10-CM | POA: Diagnosis not present

## 2015-08-22 DIAGNOSIS — M5412 Radiculopathy, cervical region: Secondary | ICD-10-CM | POA: Diagnosis not present

## 2015-08-22 DIAGNOSIS — Z6826 Body mass index (BMI) 26.0-26.9, adult: Secondary | ICD-10-CM | POA: Diagnosis not present

## 2015-08-22 DIAGNOSIS — M5416 Radiculopathy, lumbar region: Secondary | ICD-10-CM | POA: Diagnosis not present

## 2015-08-22 DIAGNOSIS — M47812 Spondylosis without myelopathy or radiculopathy, cervical region: Secondary | ICD-10-CM | POA: Diagnosis not present

## 2015-08-22 DIAGNOSIS — M545 Low back pain: Secondary | ICD-10-CM | POA: Diagnosis not present

## 2015-08-22 DIAGNOSIS — M47816 Spondylosis without myelopathy or radiculopathy, lumbar region: Secondary | ICD-10-CM | POA: Diagnosis not present

## 2015-08-22 DIAGNOSIS — M542 Cervicalgia: Secondary | ICD-10-CM | POA: Diagnosis not present

## 2015-08-26 DIAGNOSIS — M76821 Posterior tibial tendinitis, right leg: Secondary | ICD-10-CM | POA: Diagnosis not present

## 2015-08-26 DIAGNOSIS — M25571 Pain in right ankle and joints of right foot: Secondary | ICD-10-CM | POA: Diagnosis not present

## 2015-08-26 DIAGNOSIS — M76822 Posterior tibial tendinitis, left leg: Secondary | ICD-10-CM | POA: Diagnosis not present

## 2015-08-26 DIAGNOSIS — R2689 Other abnormalities of gait and mobility: Secondary | ICD-10-CM | POA: Diagnosis not present

## 2015-08-26 DIAGNOSIS — M25572 Pain in left ankle and joints of left foot: Secondary | ICD-10-CM | POA: Diagnosis not present

## 2015-09-30 DIAGNOSIS — Z6826 Body mass index (BMI) 26.0-26.9, adult: Secondary | ICD-10-CM | POA: Diagnosis not present

## 2015-09-30 DIAGNOSIS — M47816 Spondylosis without myelopathy or radiculopathy, lumbar region: Secondary | ICD-10-CM | POA: Diagnosis not present

## 2015-09-30 DIAGNOSIS — M542 Cervicalgia: Secondary | ICD-10-CM | POA: Diagnosis not present

## 2015-09-30 DIAGNOSIS — M47812 Spondylosis without myelopathy or radiculopathy, cervical region: Secondary | ICD-10-CM | POA: Diagnosis not present

## 2015-09-30 DIAGNOSIS — M4120 Other idiopathic scoliosis, site unspecified: Secondary | ICD-10-CM | POA: Diagnosis not present

## 2015-12-30 DIAGNOSIS — M1990 Unspecified osteoarthritis, unspecified site: Secondary | ICD-10-CM | POA: Diagnosis not present

## 2015-12-30 DIAGNOSIS — Z0000 Encounter for general adult medical examination without abnormal findings: Secondary | ICD-10-CM | POA: Diagnosis not present

## 2015-12-30 DIAGNOSIS — D692 Other nonthrombocytopenic purpura: Secondary | ICD-10-CM | POA: Diagnosis not present

## 2015-12-30 DIAGNOSIS — K635 Polyp of colon: Secondary | ICD-10-CM | POA: Diagnosis not present

## 2015-12-30 DIAGNOSIS — M47812 Spondylosis without myelopathy or radiculopathy, cervical region: Secondary | ICD-10-CM | POA: Diagnosis not present

## 2015-12-30 DIAGNOSIS — E7800 Pure hypercholesterolemia, unspecified: Secondary | ICD-10-CM | POA: Diagnosis not present

## 2015-12-30 DIAGNOSIS — M47816 Spondylosis without myelopathy or radiculopathy, lumbar region: Secondary | ICD-10-CM | POA: Diagnosis not present

## 2015-12-30 DIAGNOSIS — M4120 Other idiopathic scoliosis, site unspecified: Secondary | ICD-10-CM | POA: Diagnosis not present

## 2015-12-30 DIAGNOSIS — N183 Chronic kidney disease, stage 3 (moderate): Secondary | ICD-10-CM | POA: Diagnosis not present

## 2015-12-30 DIAGNOSIS — Z23 Encounter for immunization: Secondary | ICD-10-CM | POA: Diagnosis not present

## 2015-12-30 DIAGNOSIS — M8588 Other specified disorders of bone density and structure, other site: Secondary | ICD-10-CM | POA: Diagnosis not present

## 2015-12-30 DIAGNOSIS — L409 Psoriasis, unspecified: Secondary | ICD-10-CM | POA: Diagnosis not present

## 2015-12-30 DIAGNOSIS — E559 Vitamin D deficiency, unspecified: Secondary | ICD-10-CM | POA: Diagnosis not present

## 2015-12-30 DIAGNOSIS — M25561 Pain in right knee: Secondary | ICD-10-CM | POA: Diagnosis not present

## 2015-12-30 DIAGNOSIS — M542 Cervicalgia: Secondary | ICD-10-CM | POA: Diagnosis not present

## 2015-12-30 DIAGNOSIS — Z79899 Other long term (current) drug therapy: Secondary | ICD-10-CM | POA: Diagnosis not present

## 2015-12-30 DIAGNOSIS — F17210 Nicotine dependence, cigarettes, uncomplicated: Secondary | ICD-10-CM | POA: Diagnosis not present

## 2016-01-05 ENCOUNTER — Other Ambulatory Visit: Payer: Self-pay | Admitting: Family Medicine

## 2016-01-05 DIAGNOSIS — F17210 Nicotine dependence, cigarettes, uncomplicated: Secondary | ICD-10-CM

## 2016-01-18 DIAGNOSIS — M1711 Unilateral primary osteoarthritis, right knee: Secondary | ICD-10-CM | POA: Diagnosis not present

## 2016-02-09 DIAGNOSIS — M8588 Other specified disorders of bone density and structure, other site: Secondary | ICD-10-CM | POA: Diagnosis not present

## 2016-02-09 DIAGNOSIS — E2839 Other primary ovarian failure: Secondary | ICD-10-CM | POA: Diagnosis not present

## 2016-02-09 DIAGNOSIS — M810 Age-related osteoporosis without current pathological fracture: Secondary | ICD-10-CM | POA: Diagnosis not present

## 2016-02-17 ENCOUNTER — Ambulatory Visit: Payer: Medicare Other

## 2016-02-24 DIAGNOSIS — M1711 Unilateral primary osteoarthritis, right knee: Secondary | ICD-10-CM | POA: Diagnosis not present

## 2016-03-15 ENCOUNTER — Other Ambulatory Visit: Payer: Self-pay | Admitting: Family Medicine

## 2016-03-15 DIAGNOSIS — Z1231 Encounter for screening mammogram for malignant neoplasm of breast: Secondary | ICD-10-CM

## 2016-03-23 DIAGNOSIS — M1711 Unilateral primary osteoarthritis, right knee: Secondary | ICD-10-CM | POA: Diagnosis not present

## 2016-03-30 DIAGNOSIS — M25561 Pain in right knee: Secondary | ICD-10-CM | POA: Diagnosis not present

## 2016-03-30 DIAGNOSIS — M47816 Spondylosis without myelopathy or radiculopathy, lumbar region: Secondary | ICD-10-CM | POA: Diagnosis not present

## 2016-03-30 DIAGNOSIS — M47812 Spondylosis without myelopathy or radiculopathy, cervical region: Secondary | ICD-10-CM | POA: Diagnosis not present

## 2016-03-30 DIAGNOSIS — M1711 Unilateral primary osteoarthritis, right knee: Secondary | ICD-10-CM | POA: Diagnosis not present

## 2016-03-30 DIAGNOSIS — Z6827 Body mass index (BMI) 27.0-27.9, adult: Secondary | ICD-10-CM | POA: Diagnosis not present

## 2016-04-06 DIAGNOSIS — M1711 Unilateral primary osteoarthritis, right knee: Secondary | ICD-10-CM | POA: Diagnosis not present

## 2016-04-13 ENCOUNTER — Ambulatory Visit: Payer: Medicare Other

## 2016-04-20 ENCOUNTER — Ambulatory Visit
Admission: RE | Admit: 2016-04-20 | Discharge: 2016-04-20 | Disposition: A | Discharge status: Home / Self Care | Payer: Medicare Other | Source: Ambulatory Visit | Attending: Family Medicine | Admitting: Family Medicine

## 2016-04-20 DIAGNOSIS — Z1231 Encounter for screening mammogram for malignant neoplasm of breast: Secondary | ICD-10-CM

## 2016-04-23 DIAGNOSIS — H16223 Keratoconjunctivitis sicca, not specified as Sjogren's, bilateral: Secondary | ICD-10-CM | POA: Diagnosis not present

## 2016-06-01 DIAGNOSIS — H2513 Age-related nuclear cataract, bilateral: Secondary | ICD-10-CM | POA: Diagnosis not present

## 2016-07-05 DIAGNOSIS — N644 Mastodynia: Secondary | ICD-10-CM | POA: Diagnosis not present

## 2016-07-06 ENCOUNTER — Other Ambulatory Visit: Payer: Self-pay | Admitting: Family Medicine

## 2016-07-06 DIAGNOSIS — N644 Mastodynia: Secondary | ICD-10-CM

## 2016-07-06 DIAGNOSIS — M25561 Pain in right knee: Secondary | ICD-10-CM | POA: Diagnosis not present

## 2016-07-06 DIAGNOSIS — M542 Cervicalgia: Secondary | ICD-10-CM | POA: Diagnosis not present

## 2016-07-06 DIAGNOSIS — Z6827 Body mass index (BMI) 27.0-27.9, adult: Secondary | ICD-10-CM | POA: Diagnosis not present

## 2016-07-06 DIAGNOSIS — M47816 Spondylosis without myelopathy or radiculopathy, lumbar region: Secondary | ICD-10-CM | POA: Diagnosis not present

## 2016-07-12 ENCOUNTER — Ambulatory Visit
Admission: RE | Admit: 2016-07-12 | Discharge: 2016-07-12 | Disposition: A | Discharge status: Home / Self Care | Payer: Medicare Other | Source: Ambulatory Visit | Attending: Family Medicine | Admitting: Family Medicine

## 2016-07-12 DIAGNOSIS — N644 Mastodynia: Secondary | ICD-10-CM

## 2016-08-31 DIAGNOSIS — M1711 Unilateral primary osteoarthritis, right knee: Secondary | ICD-10-CM | POA: Diagnosis not present

## 2016-10-11 DIAGNOSIS — M1711 Unilateral primary osteoarthritis, right knee: Secondary | ICD-10-CM | POA: Diagnosis not present

## 2016-10-19 DIAGNOSIS — M1711 Unilateral primary osteoarthritis, right knee: Secondary | ICD-10-CM | POA: Diagnosis not present

## 2016-10-26 DIAGNOSIS — M1711 Unilateral primary osteoarthritis, right knee: Secondary | ICD-10-CM | POA: Diagnosis not present

## 2016-11-16 DIAGNOSIS — Z6827 Body mass index (BMI) 27.0-27.9, adult: Secondary | ICD-10-CM | POA: Diagnosis not present

## 2016-11-16 DIAGNOSIS — M47816 Spondylosis without myelopathy or radiculopathy, lumbar region: Secondary | ICD-10-CM | POA: Diagnosis not present

## 2016-11-16 DIAGNOSIS — M25561 Pain in right knee: Secondary | ICD-10-CM | POA: Diagnosis not present

## 2016-11-16 DIAGNOSIS — M4126 Other idiopathic scoliosis, lumbar region: Secondary | ICD-10-CM | POA: Diagnosis not present

## 2016-11-30 DIAGNOSIS — M76822 Posterior tibial tendinitis, left leg: Secondary | ICD-10-CM | POA: Diagnosis not present

## 2016-11-30 DIAGNOSIS — M76821 Posterior tibial tendinitis, right leg: Secondary | ICD-10-CM | POA: Diagnosis not present

## 2016-12-07 DIAGNOSIS — M76822 Posterior tibial tendinitis, left leg: Secondary | ICD-10-CM | POA: Diagnosis not present

## 2016-12-10 DIAGNOSIS — M76821 Posterior tibial tendinitis, right leg: Secondary | ICD-10-CM | POA: Diagnosis not present

## 2016-12-28 DIAGNOSIS — M1711 Unilateral primary osteoarthritis, right knee: Secondary | ICD-10-CM | POA: Diagnosis not present

## 2017-02-01 DIAGNOSIS — M810 Age-related osteoporosis without current pathological fracture: Secondary | ICD-10-CM | POA: Diagnosis not present

## 2017-02-01 DIAGNOSIS — Z0000 Encounter for general adult medical examination without abnormal findings: Secondary | ICD-10-CM | POA: Diagnosis not present

## 2017-02-01 DIAGNOSIS — N183 Chronic kidney disease, stage 3 (moderate): Secondary | ICD-10-CM | POA: Diagnosis not present

## 2017-02-01 DIAGNOSIS — E7800 Pure hypercholesterolemia, unspecified: Secondary | ICD-10-CM | POA: Diagnosis not present

## 2017-02-01 DIAGNOSIS — Z136 Encounter for screening for cardiovascular disorders: Secondary | ICD-10-CM | POA: Diagnosis not present

## 2017-02-01 DIAGNOSIS — Z6829 Body mass index (BMI) 29.0-29.9, adult: Secondary | ICD-10-CM | POA: Diagnosis not present

## 2017-02-01 DIAGNOSIS — D692 Other nonthrombocytopenic purpura: Secondary | ICD-10-CM | POA: Diagnosis not present

## 2017-02-01 DIAGNOSIS — Z79899 Other long term (current) drug therapy: Secondary | ICD-10-CM | POA: Diagnosis not present

## 2017-02-13 DIAGNOSIS — M6701 Short Achilles tendon (acquired), right ankle: Secondary | ICD-10-CM | POA: Diagnosis not present

## 2017-02-13 DIAGNOSIS — M76821 Posterior tibial tendinitis, right leg: Secondary | ICD-10-CM | POA: Diagnosis not present

## 2017-02-13 DIAGNOSIS — M6702 Short Achilles tendon (acquired), left ankle: Secondary | ICD-10-CM | POA: Diagnosis not present

## 2017-02-13 DIAGNOSIS — M76822 Posterior tibial tendinitis, left leg: Secondary | ICD-10-CM | POA: Diagnosis not present

## 2017-02-15 DIAGNOSIS — M25561 Pain in right knee: Secondary | ICD-10-CM | POA: Diagnosis not present

## 2017-02-15 DIAGNOSIS — M542 Cervicalgia: Secondary | ICD-10-CM | POA: Diagnosis not present

## 2017-02-15 DIAGNOSIS — Z6828 Body mass index (BMI) 28.0-28.9, adult: Secondary | ICD-10-CM | POA: Diagnosis not present

## 2017-02-15 DIAGNOSIS — M4126 Other idiopathic scoliosis, lumbar region: Secondary | ICD-10-CM | POA: Diagnosis not present

## 2017-02-15 DIAGNOSIS — R030 Elevated blood-pressure reading, without diagnosis of hypertension: Secondary | ICD-10-CM | POA: Diagnosis not present

## 2017-03-29 DIAGNOSIS — M1711 Unilateral primary osteoarthritis, right knee: Secondary | ICD-10-CM | POA: Diagnosis not present

## 2017-04-23 ENCOUNTER — Other Ambulatory Visit: Payer: Self-pay | Admitting: Family Medicine

## 2017-04-23 DIAGNOSIS — Z1231 Encounter for screening mammogram for malignant neoplasm of breast: Secondary | ICD-10-CM

## 2017-05-03 DIAGNOSIS — M1711 Unilateral primary osteoarthritis, right knee: Secondary | ICD-10-CM | POA: Diagnosis not present

## 2017-05-17 ENCOUNTER — Ambulatory Visit
Admission: RE | Admit: 2017-05-17 | Discharge: 2017-05-17 | Disposition: A | Discharge status: Home / Self Care | Payer: Medicare Other | Source: Ambulatory Visit | Attending: Family Medicine | Admitting: Family Medicine

## 2017-05-17 DIAGNOSIS — M542 Cervicalgia: Secondary | ICD-10-CM | POA: Diagnosis not present

## 2017-05-17 DIAGNOSIS — Z1231 Encounter for screening mammogram for malignant neoplasm of breast: Secondary | ICD-10-CM | POA: Diagnosis not present

## 2017-05-17 DIAGNOSIS — R030 Elevated blood-pressure reading, without diagnosis of hypertension: Secondary | ICD-10-CM | POA: Diagnosis not present

## 2017-05-17 DIAGNOSIS — Z6828 Body mass index (BMI) 28.0-28.9, adult: Secondary | ICD-10-CM | POA: Diagnosis not present

## 2017-05-17 DIAGNOSIS — M47816 Spondylosis without myelopathy or radiculopathy, lumbar region: Secondary | ICD-10-CM | POA: Diagnosis not present

## 2017-06-06 DIAGNOSIS — M1711 Unilateral primary osteoarthritis, right knee: Secondary | ICD-10-CM | POA: Diagnosis not present

## 2017-06-09 ENCOUNTER — Ambulatory Visit: Payer: Self-pay | Admitting: Orthopedic Surgery

## 2017-06-28 DIAGNOSIS — H2513 Age-related nuclear cataract, bilateral: Secondary | ICD-10-CM | POA: Diagnosis not present

## 2017-07-05 DIAGNOSIS — M1990 Unspecified osteoarthritis, unspecified site: Secondary | ICD-10-CM | POA: Diagnosis not present

## 2017-07-05 DIAGNOSIS — Z7189 Other specified counseling: Secondary | ICD-10-CM | POA: Diagnosis not present

## 2017-07-09 DIAGNOSIS — H26491 Other secondary cataract, right eye: Secondary | ICD-10-CM | POA: Diagnosis not present

## 2017-07-09 DIAGNOSIS — H26493 Other secondary cataract, bilateral: Secondary | ICD-10-CM | POA: Diagnosis not present

## 2017-07-09 DIAGNOSIS — H26492 Other secondary cataract, left eye: Secondary | ICD-10-CM | POA: Diagnosis not present

## 2017-07-12 DIAGNOSIS — M1711 Unilateral primary osteoarthritis, right knee: Secondary | ICD-10-CM | POA: Diagnosis not present

## 2017-07-16 ENCOUNTER — Ambulatory Visit: Payer: Self-pay | Admitting: Orthopedic Surgery

## 2017-07-16 NOTE — H&P (Signed)
Name Sandra [name], Sandra [name] (80GN, F)  DOB 07/06/1940   Patient's Pharmacies [id number]): [address], [place] [address], Ph [phone], Fax [phone]  Vitals BP - 124/68 Pulse 64 Ht - 62 inches Wt - 165 lbs   Allergies Reviewed Allergies CODEINE: Headache  PENICILLINS: - Localized reaction with injection but able to take oral PCN  RESINOL: - Skin Irritation  SULFA (SULFONAMIDE ANTIBIOTICS): - Unkown Reaction - Childhood RXN   Medications Reviewed Medications alendronate 70 mg tablet  gabapentin 400 mg capsule  naproxen 500 mg tablet  oxyCODONE 10 mg tablet   Problems Reviewed Problems Osteoarthritis of knee - Onset: 07/12/2017  Family History Reviewed Family History Father - Chronic obstructive lung disease Son - Congestive heart failure Sister - Diabetes mellitus Brother - Heart disease   - Hypertensive disorder  Social History Reviewed Social History Smoking Status: Current every day smoker Occupation: Solicitor and Engineer, production Alcohol intake: Occasional  Surgical History Reviewed Surgical History Tubal ligation - August 1986 Appendectomy - Fall 1966 Lumbar spinal fusion Decompression of spinal cord Hysterectomy Cataract surgery  Past Medical History Reviewed Past Medical History High Cholesterol: Y Psoriasis: Y   HPI Patient is 77 year old female scheduled for a right total knee arthroplasty on 07/29/2017 at [facility].  The patient is a 77 year old female who presented for their right knee. The patient was being followed for their right knee pain and osteoarthritis. They are now months out from cortisone injection. Symptoms reported include: pain, aching and difficulty ambulating. The patient  Describes mild to moderate pain associated with the knee.  Her RIGHT knee pain and dysfunction has got progressively worse over time. The cortisone helped a little with the discomfort temporarily but it wore off.  Her main issue is the progressive valgus deformity of the knee which has led to instability symptoms. The knee gives out on her regularly and she is concerned about falling. The knee is having a very negative aspect on her ability to do things she desires. She is ready to get the knee fixed. Risks and benefits of the surgery have been discussed with the patient and they elect to proceed with surgery.  There are on active contraindications to upcoming procedure such as ongoing infection or progressive neurological disease.   ROS Constitutional: Constitutional: no significant weight gain or loss and no fever.  HEENT: Eyes: no irritation, dry eyes, vision change, or sore throat.  Cardiovascular: Cardiovascular: no palpitations or chest pain.  Respiratory: Respiratory: no cough or shortness of breath and No COPD.  Gastrointestinal: Gastrointestinal: no vomiting or diarrhea and not vomiting blood.  Genitourinary: Genitourinary: nocturia . Musculoskeletal: Musculoskeletal: no swelling in Joints and Joint Pain.   Physical Exam General appearance: alert, cooperative and good historian Head: Normocephalic, without obvious abnormality, atraumatic Lungs: clear to auscultation bilaterally Heart: regular rate and rhythm, S1, S2 normal, no murmur, click, rub or gallop Abdomen: soft, non-tender; bowel sounds normal; no masses,  no organomegaly Extremities: Her RIGHT knee shows no effusion. She has significant valgus deformity. Range of motion is 5-125. She is tender lateral greater than medial. There is no instability about the knee. Her gait pattern is significantly antalgic.   Review of her radiographs show bone-on-bone arthritis lateral patellofemoral.  Assessment / Plan 1. Osteoarthritis of right knee joint M17.11: Unilateral primary osteoarthritis, right knee  Discussion Notes Surgical Plans: Right Total Knee Replacement  Disposition: Home  PCP: Dr. Jonathon Jordan  IV  TXA  Anesthesia  Issues: Difficulty waking up after a back surgery  Patient was instructed on what medications to stop prior to surgery.  Return to Office Gaynelle Arabian, MD for Post-Op at [facility] on 08/13/2017 at 01:45 PM  Therapy Gus Puma, DPT for 5-Physical Therapy Evaluation at [facility] on 08/02/2017 at 02:00 PM Ericka Pontiff, DPT for 5-Physical Therapy Evaluation at [facility] on 08/02/2017 at 12:00 PM  Arlee Muslim, PA-C

## 2017-07-16 NOTE — H&P (View-Only) (Signed)
Name Sandra [name], Sandra [name] (44PN, F)  DOB Jun 09, 1941   Patient's Pharmacies [id number]): [address], [place] [address], Ph [phone], Fax [phone]  Vitals BP - 124/68 Pulse 64 Ht - 62 inches Wt - 165 lbs   Allergies Reviewed Allergies CODEINE: Headache  PENICILLINS: - Localized reaction with injection but able to take oral PCN  RESINOL: - Skin Irritation  SULFA (SULFONAMIDE ANTIBIOTICS): - Unkown Reaction - Childhood RXN   Medications Reviewed Medications alendronate 70 mg tablet  gabapentin 400 mg capsule  naproxen 500 mg tablet  oxyCODONE 10 mg tablet   Problems Reviewed Problems Osteoarthritis of knee - Onset: 07/12/2017  Family History Reviewed Family History Father - Chronic obstructive lung disease Son - Congestive heart failure Sister - Diabetes mellitus Brother - Heart disease   - Hypertensive disorder  Social History Reviewed Social History Smoking Status: Current every day smoker Occupation: Solicitor and Engineer, production Alcohol intake: Occasional  Surgical History Reviewed Surgical History Tubal ligation - August 1986 Appendectomy - Fall 1966 Lumbar spinal fusion Decompression of spinal cord Hysterectomy Cataract surgery  Past Medical History Reviewed Past Medical History High Cholesterol: Y Psoriasis: Y   HPI Patient is 77 year old female scheduled for a right total knee arthroplasty on 07/29/2017 at [facility].  The patient is a 77 year old female who presented for their right knee. The patient was being followed for their right knee pain and osteoarthritis. They are now months out from cortisone injection. Symptoms reported include: pain, aching and difficulty ambulating. The patient  Describes mild to moderate pain associated with the knee.  Her RIGHT knee pain and dysfunction has got progressively worse over time. The cortisone helped a little with the discomfort temporarily but it wore off.  Her main issue is the progressive valgus deformity of the knee which has led to instability symptoms. The knee gives out on her regularly and she is concerned about falling. The knee is having a very negative aspect on her ability to do things she desires. She is ready to get the knee fixed. Risks and benefits of the surgery have been discussed with the patient and they elect to proceed with surgery.  There are on active contraindications to upcoming procedure such as ongoing infection or progressive neurological disease.   ROS Constitutional: Constitutional: no significant weight gain or loss and no fever.  HEENT: Eyes: no irritation, dry eyes, vision change, or sore throat.  Cardiovascular: Cardiovascular: no palpitations or chest pain.  Respiratory: Respiratory: no cough or shortness of breath and No COPD.  Gastrointestinal: Gastrointestinal: no vomiting or diarrhea and not vomiting blood.  Genitourinary: Genitourinary: nocturia . Musculoskeletal: Musculoskeletal: no swelling in Joints and Joint Pain.   Physical Exam General appearance: alert, cooperative and good historian Head: Normocephalic, without obvious abnormality, atraumatic Lungs: clear to auscultation bilaterally Heart: regular rate and rhythm, S1, S2 normal, no murmur, click, rub or gallop Abdomen: soft, non-tender; bowel sounds normal; no masses,  no organomegaly Extremities: Her RIGHT knee shows no effusion. She has significant valgus deformity. Range of motion is 5-125. She is tender lateral greater than medial. There is no instability about the knee. Her gait pattern is significantly antalgic.   Review of her radiographs show bone-on-bone arthritis lateral patellofemoral.  Assessment / Plan 1. Osteoarthritis of right knee joint M17.11: Unilateral primary osteoarthritis, right knee  Discussion Notes Surgical Plans: Right Total Knee Replacement  Disposition: Home  PCP: Dr. Jonathon Jordan  IV  TXA  Anesthesia  Issues: Difficulty waking up after a back surgery  Patient was instructed on what medications to stop prior to surgery.  Return to Office Gaynelle Arabian, MD for Post-Op at [facility] on 08/13/2017 at 01:45 PM  Therapy Gus Puma, DPT for 5-Physical Therapy Evaluation at [facility] on 08/02/2017 at 02:00 PM Ericka Pontiff, DPT for 5-Physical Therapy Evaluation at [facility] on 08/02/2017 at 12:00 PM  Arlee Muslim, PA-C

## 2017-07-23 NOTE — Patient Instructions (Addendum)
Sandra [name]  07/23/2017   Your procedure is scheduled on: 07-29-17   Report to [facility] Main  Entrance Follow signs to Short Stay on first floor at 5:30 AM    Call this number if you have problems the morning of surgery [phone]   Remember: Do not eat food or drink liquids :After Midnight.     Take these medicines the morning of surgery with A SIP OF WATER:Gabapentin (Neurontin). You may also bring and use your eyedrops as needed.                                You may not have any metal on your body including hair pins and              piercings  Do not wear jewelry, make-up, lotions, powders or perfumes, deodorant             Do not wear nail polish.  Do not shave  48 hours prior to surgery.                 Do not bring valuables to the hospital. [place].  Contacts, dentures or bridgework may not be worn into surgery.  Leave suitcase in the car. After surgery it may be brought to your room.                 Please read over the following fact sheets you were given: _____________________________________________________________________          [facility] - Preparing for Surgery Before surgery, you can play an important role.  Because skin is not sterile, your skin needs to be as free of germs as possible.  You can reduce the number of germs on your skin by washing with CHG (chlorahexidine gluconate) soap before surgery.  CHG is an antiseptic cleaner which kills germs and bonds with the skin to continue killing germs even after washing. Please DO NOT use if you have an allergy to CHG or antibacterial soaps.  If your skin becomes reddened/irritated stop using the CHG and inform your nurse when you arrive at Short Stay. Do not shave (including legs and underarms) for at least 48 hours prior to the first CHG shower.  You may shave your face/neck. Please follow these instructions carefully:  1.  Shower  with CHG Soap the night before surgery and the  morning of Surgery.  2.  If you choose to wash your hair, wash your hair first as usual with your  normal  shampoo.  3.  After you shampoo, rinse your hair and body thoroughly to remove the  shampoo.                           4.  Use CHG as you would any other liquid soap.  You can apply chg directly  to the skin and wash                       Gently with a scrungie or clean washcloth.  5.  Apply the CHG Soap to your body ONLY FROM THE NECK DOWN.   Do not use on face/ open  Wound or open sores. Avoid contact with eyes, ears mouth and genitals (private parts).                       Wash face,  Genitals (private parts) with your normal soap.             6.  Wash thoroughly, paying special attention to the area where your surgery  will be performed.  7.  Thoroughly rinse your body with warm water from the neck down.  8.  DO NOT shower/wash with your normal soap after using and rinsing off  the CHG Soap.                9.  Pat yourself dry with a clean towel.            10.  Wear clean pajamas.            11.  Place clean sheets on your bed the night of your first shower and do not  sleep with pets. Day of Surgery : Do not apply any lotions/deodorants the morning of surgery.  Please wear clean clothes to the hospital/surgery center.  FAILURE TO FOLLOW THESE INSTRUCTIONS MAY RESULT IN THE CANCELLATION OF YOUR SURGERY PATIENT SIGNATURE_________________________________  NURSE SIGNATURE__________________________________  ________________________________________________________________________   [name]  An incentive spirometer is a tool that can help keep your lungs clear and active. This tool measures how well you are filling your lungs with each breath. Taking long deep breaths may help reverse or decrease the chance of developing breathing (pulmonary) problems (especially infection) following:  A long period  of time when you are unable to move or be active. BEFORE THE PROCEDURE   If the spirometer includes an indicator to show your best effort, your nurse or respiratory therapist will set it to a desired goal.  If possible, sit up straight or lean slightly forward. Try not to slouch.  Hold the incentive spirometer in an upright position. INSTRUCTIONS FOR USE  1. Sit on the edge of your bed if possible, or sit up as far as you can in bed or on a chair. 2. Hold the incentive spirometer in an upright position. 3. Breathe out normally. 4. Place the mouthpiece in your mouth and seal your lips tightly around it. 5. Breathe in slowly and as deeply as possible, raising the piston or the ball toward the top of the column. 6. Hold your breath for 3-5 seconds or for as long as possible. Allow the piston or ball to fall to the bottom of the column. 7. Remove the mouthpiece from your mouth and breathe out normally. 8. Rest for a few seconds and repeat Steps 1 through 7 at least 10 times every 1-2 hours when you are awake. Take your time and take a few normal breaths between deep breaths. 9. The spirometer may include an indicator to show your best effort. Use the indicator as a goal to work toward during each repetition. 10. After each set of 10 deep breaths, practice coughing to be sure your lungs are clear. If you have an incision (the cut made at the time of surgery), support your incision when coughing by placing a pillow or rolled up towels firmly against it. Once you are able to get out of bed, walk around indoors and cough well. You may stop using the incentive spirometer when instructed by your caregiver.  RISKS AND COMPLICATIONS  Take your time so you do not get  dizzy or light-headed.  If you are in pain, you may need to take or ask for pain medication before doing incentive spirometry. It is harder to take a deep breath if you are having pain. AFTER USE  Rest and breathe slowly and easily.  It  can be helpful to keep track of a log of your progress. Your caregiver can provide you with a simple table to help with this. If you are using the spirometer at home, follow these instructions: [place] IF:   You are having difficultly using the spirometer.  You have trouble using the spirometer as often as instructed.  Your pain medication is not giving enough relief while using the spirometer.  You develop fever of 100.5 F (38.1 C) or higher. SEEK IMMEDIATE MEDICAL CARE IF:   You cough up bloody sputum that had not been present before.  You develop fever of 102 F (38.9 C) or greater.  You develop worsening pain at or near the incision site. MAKE SURE YOU:   Understand these instructions.  Will watch your condition.  Will get help right away if you are not doing well or get worse. Document Released: 10/29/2006 Document Revised: 09/10/2011 Document Reviewed: 12/30/2006 ExitCare Patient Information 2014 ExitCare, [place].   ________________________________________________________________________  WHAT IS A BLOOD TRANSFUSION? Blood Transfusion Information  A transfusion is the replacement of blood or some of its parts. Blood is made up of multiple cells which provide different functions.  Red blood cells carry oxygen and are used for blood loss replacement.  White blood cells fight against infection.  Platelets control bleeding.  Plasma helps clot blood.  Other blood products are available for specialized needs, such as hemophilia or other clotting disorders. BEFORE THE TRANSFUSION  Who gives blood for transfusions?   Healthy volunteers who are fully evaluated to make sure their blood is safe. This is blood bank blood. Transfusion therapy is the safest it has ever been in the practice of medicine. Before blood is taken from a donor, a complete history is taken to make sure that person has no history of diseases nor engages in risky social behavior (examples  are intravenous drug use or sexual activity with multiple partners). The donor's travel history is screened to minimize risk of transmitting infections, such as malaria. The donated blood is tested for signs of infectious diseases, such as HIV and hepatitis. The blood is then tested to be sure it is compatible with you in order to minimize the chance of a transfusion reaction. If you or a relative donates blood, this is often done in anticipation of surgery and is not appropriate for emergency situations. It takes many days to process the donated blood. RISKS AND COMPLICATIONS Although transfusion therapy is very safe and saves many lives, the main dangers of transfusion include:   Getting an infectious disease.  Developing a transfusion reaction. This is an allergic reaction to something in the blood you were given. Every precaution is taken to prevent this. The decision to have a blood transfusion has been considered carefully by your caregiver before blood is given. Blood is not given unless the benefits outweigh the risks. AFTER THE TRANSFUSION  Right after receiving a blood transfusion, you will usually feel much better and more energetic. This is especially true if your red blood cells have gotten low (anemic). The transfusion raises the level of the red blood cells which carry oxygen, and this usually causes an energy increase.  The nurse administering the transfusion will  monitor you carefully for complications. HOME CARE INSTRUCTIONS  No special instructions are needed after a transfusion. You may find your energy is better. Speak with your caregiver about any limitations on activity for underlying diseases you may have. SEEK MEDICAL CARE IF:   Your condition is not improving after your transfusion.  You develop redness or irritation at the intravenous (IV) site. SEEK IMMEDIATE MEDICAL CARE IF:  Any of the following symptoms occur over the next 12 hours:  Shaking chills.  You have a  temperature by mouth above 102 F (38.9 C), not controlled by medicine.  Chest, back, or muscle pain.  People around you feel you are not acting correctly or are confused.  Shortness of breath or difficulty breathing.  Dizziness and fainting.  You get a rash or develop hives.  You have a decrease in urine output.  Your urine turns a dark color or changes to pink, red, or brown. Any of the following symptoms occur over the next 10 days:  You have a temperature by mouth above 102 F (38.9 C), not controlled by medicine.  Shortness of breath.  Weakness after normal activity.  The white part of the eye turns yellow (jaundice).  You have a decrease in the amount of urine or are urinating less often.  Your urine turns a dark color or changes to pink, red, or brown. Document Released: 06/15/2000 Document Revised: 09/10/2011 Document Reviewed: 02/02/2008 [facility] Patient Information 2014 [place], [place].  _______________________________________________________________________

## 2017-07-23 NOTE — Progress Notes (Signed)
05-14-17 Surgical clearance from Dr. Stephanie Acre on chart

## 2017-07-24 ENCOUNTER — Encounter (HOSPITAL_COMMUNITY): Payer: Self-pay

## 2017-07-24 ENCOUNTER — Other Ambulatory Visit: Payer: Self-pay

## 2017-07-24 ENCOUNTER — Encounter (HOSPITAL_COMMUNITY)
Admission: RE | Admit: 2017-07-24 | Discharge: 2017-07-24 | Disposition: A | Discharge status: Home / Self Care | Payer: Medicare Other | Source: Ambulatory Visit | Attending: Orthopedic Surgery | Admitting: Orthopedic Surgery

## 2017-07-24 DIAGNOSIS — Z86718 Personal history of other venous thrombosis and embolism: Secondary | ICD-10-CM | POA: Diagnosis not present

## 2017-07-24 DIAGNOSIS — Z01812 Encounter for preprocedural laboratory examination: Secondary | ICD-10-CM | POA: Diagnosis not present

## 2017-07-24 LAB — CBC
HCT: 41.1 % (ref 36.0–46.0)
Hemoglobin: 13.2 g/dL (ref 12.0–15.0)
MCH: 29.6 pg (ref 26.0–34.0)
MCHC: 32.1 g/dL (ref 30.0–36.0)
MCV: 92.2 fL (ref 78.0–100.0)
PLATELETS: 212 10*3/uL (ref 150–400)
RBC: 4.46 MIL/uL (ref 3.87–5.11)
RDW: 12.8 % (ref 11.5–15.5)
WBC: 5.9 10*3/uL (ref 4.0–10.5)

## 2017-07-24 LAB — COMPREHENSIVE METABOLIC PANEL
ALBUMIN: 3.8 g/dL (ref 3.5–5.0)
ALK PHOS: 65 U/L (ref 38–126)
ALT: 18 U/L (ref 14–54)
AST: 26 U/L (ref 15–41)
Anion gap: 7 (ref 5–15)
BUN: 13 mg/dL (ref 6–20)
CALCIUM: 9.3 mg/dL (ref 8.9–10.3)
CO2: 29 mmol/L (ref 22–32)
CREATININE: 0.96 mg/dL (ref 0.44–1.00)
Chloride: 104 mmol/L (ref 101–111)
GFR, EST NON AFRICAN AMERICAN: 57 mL/min — AB (ref >60)
Glucose, Bld: 60 mg/dL — ABNORMAL LOW (ref 65–99)
Potassium: 4.2 mmol/L (ref 3.5–5.1)
SODIUM: 140 mmol/L (ref 135–145)
Total Bilirubin: 0.6 mg/dL (ref 0.3–1.2)
Total Protein: 7.3 g/dL (ref 6.5–8.1)

## 2017-07-24 LAB — SURGICAL PCR SCREEN
MRSA, PCR: NEGATIVE
STAPHYLOCOCCUS AUREUS: NEGATIVE

## 2017-07-24 LAB — PROTIME-INR
INR: 1.01
PROTHROMBIN TIME: 13.2 s (ref 11.4–15.2)

## 2017-07-24 LAB — ABO/RH: ABO/RH(D): O POS

## 2017-07-24 LAB — APTT: APTT: 30 s (ref 24–36)

## 2017-07-28 MED ORDER — TRANEXAMIC ACID 1000 MG/10ML IV SOLN
1000.0000 mg | INTRAVENOUS | Status: AC
  Administered 2017-07-29: 1000 mg via INTRAVENOUS
  Filled 2017-07-28: qty 1100

## 2017-07-28 MED ORDER — BUPIVACAINE LIPOSOME 1.3 % IJ SUSP
20.0000 mL | INTRAMUSCULAR | Status: DC
  Filled 2017-07-28: qty 20

## 2017-07-28 NOTE — Anesthesia Preprocedure Evaluation (Addendum)
Anesthesia Evaluation  Patient identified by MRN, date of birth, ID band Patient awake    Reviewed: Allergy & Precautions, NPO status , Patient's Chart, lab work & pertinent test results  Airway Mallampati: II  TM Distance: >3 FB Neck ROM: Full    Dental  (+) Teeth Intact, Dental Advisory Given, Caps   Pulmonary former smoker,    breath sounds clear to auscultation       Cardiovascular negative cardio ROS   Rhythm:Regular Rate:Bradycardia     Neuro/Psych  Headaches, negative psych ROS   GI/Hepatic negative GI ROS, Neg liver ROS,   Endo/Other  negative endocrine ROS  Renal/GU negative Renal ROS     Musculoskeletal  (+) Arthritis , Osteoarthritis,    Abdominal Normal abdominal exam  (+)   Peds  Hematology negative hematology ROS (+)   Anesthesia Other Findings   Reproductive/Obstetrics                            Lab Results  Component Value Date   WBC 5.9 07/24/2017   HGB 13.2 07/24/2017   HCT 41.1 07/24/2017   MCV 92.2 07/24/2017   PLT 212 07/24/2017     Anesthesia Physical Anesthesia Plan  ASA: II  Anesthesia Plan: General   Post-op Pain Management: GA combined w/ Regional for post-op pain   Induction: Intravenous  PONV Risk Score and Plan: 4 or greater and Ondansetron, Dexamethasone, Midazolam and Treatment may vary due to age or medical condition  Airway Management Planned: LMA  Additional Equipment: None  Intra-op Plan:   Post-operative Plan: Extubation in OR  Informed Consent: I have reviewed the patients History and Physical, chart, labs and discussed the procedure including the risks, benefits and alternatives for the proposed anesthesia with the patient or authorized representative who has indicated his/her understanding and acceptance.   Dental advisory given  Plan Discussed with: CRNA  Anesthesia Plan Comments: (Pt declined spinal due to significant back  surgery and hardware.)        Anesthesia Quick Evaluation

## 2017-07-29 ENCOUNTER — Other Ambulatory Visit: Payer: Self-pay

## 2017-07-29 ENCOUNTER — Inpatient Hospital Stay (HOSPITAL_COMMUNITY): Payer: Medicare Other | Admitting: Anesthesiology

## 2017-07-29 ENCOUNTER — Encounter (HOSPITAL_COMMUNITY)
Admission: RE | Disposition: A | Discharge status: Home / Self Care | Payer: Self-pay | Source: Ambulatory Visit | Attending: Orthopedic Surgery

## 2017-07-29 ENCOUNTER — Encounter (HOSPITAL_COMMUNITY): Payer: Self-pay | Admitting: Emergency Medicine

## 2017-07-29 ENCOUNTER — Inpatient Hospital Stay (HOSPITAL_COMMUNITY)
Admission: RE | Admit: 2017-07-29 | Discharge: 2017-07-31 | DRG: 470 | Disposition: A | Discharge status: Home / Self Care | Payer: Medicare Other | Source: Ambulatory Visit | Attending: Orthopedic Surgery | Admitting: Orthopedic Surgery

## 2017-07-29 DIAGNOSIS — Z880 Allergy status to penicillin: Secondary | ICD-10-CM | POA: Diagnosis not present

## 2017-07-29 DIAGNOSIS — M1711 Unilateral primary osteoarthritis, right knee: Principal | ICD-10-CM | POA: Diagnosis present

## 2017-07-29 DIAGNOSIS — Z882 Allergy status to sulfonamides status: Secondary | ICD-10-CM | POA: Diagnosis not present

## 2017-07-29 DIAGNOSIS — G8918 Other acute postprocedural pain: Secondary | ICD-10-CM | POA: Diagnosis not present

## 2017-07-29 DIAGNOSIS — M21061 Valgus deformity, not elsewhere classified, right knee: Secondary | ICD-10-CM | POA: Diagnosis not present

## 2017-07-29 DIAGNOSIS — L409 Psoriasis, unspecified: Secondary | ICD-10-CM | POA: Diagnosis present

## 2017-07-29 DIAGNOSIS — Z981 Arthrodesis status: Secondary | ICD-10-CM

## 2017-07-29 DIAGNOSIS — Z79899 Other long term (current) drug therapy: Secondary | ICD-10-CM | POA: Diagnosis not present

## 2017-07-29 DIAGNOSIS — Z885 Allergy status to narcotic agent status: Secondary | ICD-10-CM

## 2017-07-29 DIAGNOSIS — Z8614 Personal history of Methicillin resistant Staphylococcus aureus infection: Secondary | ICD-10-CM | POA: Diagnosis not present

## 2017-07-29 DIAGNOSIS — Z8249 Family history of ischemic heart disease and other diseases of the circulatory system: Secondary | ICD-10-CM | POA: Diagnosis not present

## 2017-07-29 DIAGNOSIS — Z888 Allergy status to other drugs, medicaments and biological substances status: Secondary | ICD-10-CM | POA: Diagnosis not present

## 2017-07-29 DIAGNOSIS — Z86718 Personal history of other venous thrombosis and embolism: Secondary | ICD-10-CM

## 2017-07-29 DIAGNOSIS — M25761 Osteophyte, right knee: Secondary | ICD-10-CM | POA: Diagnosis present

## 2017-07-29 DIAGNOSIS — Z825 Family history of asthma and other chronic lower respiratory diseases: Secondary | ICD-10-CM | POA: Diagnosis not present

## 2017-07-29 DIAGNOSIS — E7800 Pure hypercholesterolemia, unspecified: Secondary | ICD-10-CM | POA: Diagnosis not present

## 2017-07-29 DIAGNOSIS — Z833 Family history of diabetes mellitus: Secondary | ICD-10-CM | POA: Diagnosis not present

## 2017-07-29 DIAGNOSIS — Z87891 Personal history of nicotine dependence: Secondary | ICD-10-CM

## 2017-07-29 DIAGNOSIS — M179 Osteoarthritis of knee, unspecified: Secondary | ICD-10-CM | POA: Diagnosis present

## 2017-07-29 DIAGNOSIS — Z90710 Acquired absence of both cervix and uterus: Secondary | ICD-10-CM | POA: Diagnosis not present

## 2017-07-29 DIAGNOSIS — M4155 Other secondary scoliosis, thoracolumbar region: Secondary | ICD-10-CM | POA: Diagnosis not present

## 2017-07-29 DIAGNOSIS — G8929 Other chronic pain: Secondary | ICD-10-CM | POA: Diagnosis present

## 2017-07-29 DIAGNOSIS — M1710 Unilateral primary osteoarthritis, unspecified knee: Secondary | ICD-10-CM | POA: Diagnosis present

## 2017-07-29 DIAGNOSIS — M25561 Pain in right knee: Secondary | ICD-10-CM | POA: Diagnosis not present

## 2017-07-29 HISTORY — PX: TOTAL KNEE ARTHROPLASTY: SHX125

## 2017-07-29 LAB — TYPE AND SCREEN
ABO/RH(D): O POS
Antibody Screen: NEGATIVE

## 2017-07-29 SURGERY — ARTHROPLASTY, KNEE, TOTAL
Anesthesia: General | Site: Knee | Laterality: Right

## 2017-07-29 MED ORDER — METHOCARBAMOL 1000 MG/10ML IJ SOLN
500.0000 mg | Freq: Four times a day (QID) | INTRAVENOUS | Status: DC | PRN
  Administered 2017-07-29: 500 mg via INTRAVENOUS
  Filled 2017-07-29: qty 550

## 2017-07-29 MED ORDER — PHENOL 1.4 % MT LIQD
1.0000 | OROMUCOSAL | Status: DC | PRN
  Filled 2017-07-29: qty 177

## 2017-07-29 MED ORDER — LIDOCAINE 2% (20 MG/ML) 5 ML SYRINGE
INTRAMUSCULAR | Status: DC | PRN
  Administered 2017-07-29: 80 mg via INTRAVENOUS

## 2017-07-29 MED ORDER — DEXAMETHASONE SODIUM PHOSPHATE 10 MG/ML IJ SOLN
INTRAMUSCULAR | Status: AC
  Filled 2017-07-29: qty 1

## 2017-07-29 MED ORDER — RIVAROXABAN 10 MG PO TABS
10.0000 mg | ORAL_TABLET | Freq: Every day | ORAL | Status: DC
  Administered 2017-07-30 – 2017-07-31 (×2): 10 mg via ORAL
  Filled 2017-07-29 (×2): qty 1

## 2017-07-29 MED ORDER — PROPOFOL 10 MG/ML IV BOLUS
INTRAVENOUS | Status: DC | PRN
  Administered 2017-07-29: 180 mg via INTRAVENOUS

## 2017-07-29 MED ORDER — LORATADINE 10 MG PO TABS: 10.0000 mg | ORAL_TABLET | Freq: Every day | ORAL | Status: DC | PRN

## 2017-07-29 MED ORDER — ROPIVACAINE HCL 7.5 MG/ML IJ SOLN
INTRAMUSCULAR | Status: DC | PRN
  Administered 2017-07-29: 20 mL via PERINEURAL

## 2017-07-29 MED ORDER — ACETAMINOPHEN 325 MG PO TABS: 650.0000 mg | ORAL_TABLET | ORAL | Status: DC | PRN

## 2017-07-29 MED ORDER — MIDAZOLAM HCL 5 MG/5ML IJ SOLN
INTRAMUSCULAR | Status: DC | PRN
  Administered 2017-07-29: 2 mg via INTRAVENOUS

## 2017-07-29 MED ORDER — TRAMADOL HCL 50 MG PO TABS: 50.0000 mg | ORAL_TABLET | Freq: Four times a day (QID) | ORAL | Status: DC | PRN

## 2017-07-29 MED ORDER — FENTANYL CITRATE (PF) 100 MCG/2ML IJ SOLN
INTRAMUSCULAR | Status: DC | PRN
  Administered 2017-07-29 (×4): 50 ug via INTRAVENOUS

## 2017-07-29 MED ORDER — HYDROMORPHONE HCL 1 MG/ML IJ SOLN
1.0000 mg | INTRAMUSCULAR | Status: DC | PRN
  Administered 2017-07-29 (×3): 1 mg via INTRAVENOUS
  Filled 2017-07-29 (×3): qty 1

## 2017-07-29 MED ORDER — BUPIVACAINE LIPOSOME 1.3 % IJ SUSP
INTRAMUSCULAR | Status: DC | PRN
  Administered 2017-07-29: 20 mL

## 2017-07-29 MED ORDER — GABAPENTIN 400 MG PO CAPS
800.0000 mg | ORAL_CAPSULE | Freq: Every day | ORAL | Status: DC
  Administered 2017-07-29 – 2017-07-30 (×2): 800 mg via ORAL
  Filled 2017-07-29 (×2): qty 2

## 2017-07-29 MED ORDER — SODIUM CHLORIDE 0.9 % IV SOLN
INTRAVENOUS | Status: DC
  Administered 2017-07-29: 12:00:00 via INTRAVENOUS

## 2017-07-29 MED ORDER — ONDANSETRON HCL 4 MG PO TABS
4.0000 mg | ORAL_TABLET | Freq: Four times a day (QID) | ORAL | Status: DC | PRN
Start: 2017-07-29 — End: 2017-07-31

## 2017-07-29 MED ORDER — HYDROMORPHONE HCL 1 MG/ML IJ SOLN: 2.0000 mg | INTRAMUSCULAR | Status: DC | PRN

## 2017-07-29 MED ORDER — ONDANSETRON HCL 4 MG/2ML IJ SOLN
INTRAMUSCULAR | Status: AC
  Filled 2017-07-29: qty 2

## 2017-07-29 MED ORDER — SODIUM CHLORIDE 0.9 % IJ SOLN
INTRAMUSCULAR | Status: AC
  Filled 2017-07-29: qty 50

## 2017-07-29 MED ORDER — POTASSIUM GLUCONATE 595 (99 K) MG PO TABS
595.0000 mg | ORAL_TABLET | Freq: Every day | ORAL | Status: DC
  Administered 2017-07-29 – 2017-07-30 (×2): 595 mg via ORAL
  Filled 2017-07-29 (×2): qty 1

## 2017-07-29 MED ORDER — DEXAMETHASONE SODIUM PHOSPHATE 10 MG/ML IJ SOLN: 10.0000 mg | Freq: Once | INTRAMUSCULAR | Status: DC

## 2017-07-29 MED ORDER — OXYCODONE HCL 5 MG PO TABS
5.0000 mg | ORAL_TABLET | ORAL | Status: DC | PRN
  Administered 2017-07-29: 5 mg via ORAL
  Filled 2017-07-29: qty 1

## 2017-07-29 MED ORDER — MENTHOL 3 MG MT LOZG: 1.0000 | LOZENGE | OROMUCOSAL | Status: DC | PRN

## 2017-07-29 MED ORDER — METOCLOPRAMIDE HCL 5 MG PO TABS: 5.0000 mg | ORAL_TABLET | Freq: Three times a day (TID) | ORAL | Status: DC | PRN

## 2017-07-29 MED ORDER — LACTATED RINGERS IV SOLN
INTRAVENOUS | Status: DC
  Administered 2017-07-29: 07:00:00 via INTRAVENOUS

## 2017-07-29 MED ORDER — HYDROMORPHONE HCL 1 MG/ML IJ SOLN
INTRAMUSCULAR | Status: AC
  Filled 2017-07-29: qty 1

## 2017-07-29 MED ORDER — ONDANSETRON HCL 4 MG/2ML IJ SOLN
4.0000 mg | Freq: Four times a day (QID) | INTRAMUSCULAR | Status: DC | PRN
Start: 2017-07-29 — End: 2017-07-31

## 2017-07-29 MED ORDER — BUPIVACAINE HCL 0.25 % IJ SOLN
INTRAMUSCULAR | Status: DC | PRN
  Administered 2017-07-29: 30 mL

## 2017-07-29 MED ORDER — PROPOFOL 10 MG/ML IV BOLUS
INTRAVENOUS | Status: AC
  Filled 2017-07-29: qty 40

## 2017-07-29 MED ORDER — TRAMADOL HCL 50 MG PO TABS
50.0000 mg | ORAL_TABLET | Freq: Four times a day (QID) | ORAL | Status: DC | PRN
  Administered 2017-07-29: 17:00:00 100 mg via ORAL
  Filled 2017-07-29: qty 2

## 2017-07-29 MED ORDER — HYDROMORPHONE HCL 2 MG/ML IJ SOLN
INTRAMUSCULAR | Status: AC
  Filled 2017-07-29: qty 1

## 2017-07-29 MED ORDER — HYDROMORPHONE HCL 1 MG/ML IJ SOLN
INTRAMUSCULAR | Status: DC | PRN
  Administered 2017-07-29 (×2): 0.5 mg via INTRAVENOUS

## 2017-07-29 MED ORDER — BUPIVACAINE HCL (PF) 0.25 % IJ SOLN
INTRAMUSCULAR | Status: AC
  Filled 2017-07-29: qty 30

## 2017-07-29 MED ORDER — GABAPENTIN 400 MG PO CAPS
400.0000 mg | ORAL_CAPSULE | Freq: Every day | ORAL | Status: DC
  Administered 2017-07-30 – 2017-07-31 (×2): 400 mg via ORAL
  Filled 2017-07-29 (×2): qty 1

## 2017-07-29 MED ORDER — OXYCODONE HCL 5 MG PO TABS
10.0000 mg | ORAL_TABLET | ORAL | Status: DC | PRN
  Administered 2017-07-29: 10 mg via ORAL
  Filled 2017-07-29: qty 2

## 2017-07-29 MED ORDER — HYDROMORPHONE HCL 1 MG/ML IJ SOLN
0.2500 mg | INTRAMUSCULAR | Status: DC | PRN
  Administered 2017-07-29 (×3): 0.5 mg via INTRAVENOUS

## 2017-07-29 MED ORDER — DIPHENHYDRAMINE HCL 12.5 MG/5ML PO ELIX: 12.5000 mg | ORAL_SOLUTION | ORAL | Status: DC | PRN

## 2017-07-29 MED ORDER — DEXAMETHASONE SODIUM PHOSPHATE 10 MG/ML IJ SOLN
10.0000 mg | Freq: Once | INTRAMUSCULAR | Status: AC
  Administered 2017-07-30: 10 mg via INTRAVENOUS
  Filled 2017-07-29: qty 1

## 2017-07-29 MED ORDER — DOCUSATE SODIUM 100 MG PO CAPS
100.0000 mg | ORAL_CAPSULE | Freq: Two times a day (BID) | ORAL | Status: DC
  Administered 2017-07-31: 100 mg via ORAL
  Filled 2017-07-29 (×4): qty 1

## 2017-07-29 MED ORDER — ACETAMINOPHEN 500 MG PO TABS
1000.0000 mg | ORAL_TABLET | Freq: Four times a day (QID) | ORAL | Status: AC
  Administered 2017-07-29 – 2017-07-30 (×4): 1000 mg via ORAL
  Filled 2017-07-29 (×4): qty 2

## 2017-07-29 MED ORDER — LIDOCAINE 2% (20 MG/ML) 5 ML SYRINGE
INTRAMUSCULAR | Status: AC
  Filled 2017-07-29: qty 5

## 2017-07-29 MED ORDER — CEFAZOLIN SODIUM-DEXTROSE 1-4 GM/50ML-% IV SOLN
1.0000 g | Freq: Four times a day (QID) | INTRAVENOUS | Status: AC
  Administered 2017-07-29 (×2): 1 g via INTRAVENOUS
  Filled 2017-07-29 (×2): qty 50

## 2017-07-29 MED ORDER — PROMETHAZINE HCL 25 MG/ML IJ SOLN: 6.2500 mg | INTRAMUSCULAR | Status: DC | PRN

## 2017-07-29 MED ORDER — FENTANYL CITRATE (PF) 100 MCG/2ML IJ SOLN
INTRAMUSCULAR | Status: AC
  Filled 2017-07-29: qty 2

## 2017-07-29 MED ORDER — SODIUM CHLORIDE 0.9 % IJ SOLN
INTRAMUSCULAR | Status: DC | PRN
  Administered 2017-07-29: 30 mL

## 2017-07-29 MED ORDER — LACTATED RINGERS IV SOLN: INTRAVENOUS | Status: DC

## 2017-07-29 MED ORDER — OXYCODONE HCL 5 MG PO TABS
15.0000 mg | ORAL_TABLET | ORAL | Status: DC | PRN
  Administered 2017-07-29 – 2017-07-31 (×12): 20 mg via ORAL
  Filled 2017-07-29 (×12): qty 4

## 2017-07-29 MED ORDER — POLYETHYLENE GLYCOL 3350 17 G PO PACK: 17.0000 g | PACK | Freq: Every day | ORAL | Status: DC | PRN

## 2017-07-29 MED ORDER — DEXAMETHASONE SODIUM PHOSPHATE 10 MG/ML IJ SOLN
INTRAMUSCULAR | Status: DC | PRN
  Administered 2017-07-29: 10 mg via INTRAVENOUS

## 2017-07-29 MED ORDER — MEPERIDINE HCL 50 MG/ML IJ SOLN: 6.2500 mg | INTRAMUSCULAR | Status: DC | PRN

## 2017-07-29 MED ORDER — SODIUM CHLORIDE 0.9 % IV SOLN
1000.0000 mg | Freq: Once | INTRAVENOUS | Status: AC
  Administered 2017-07-29: 12:00:00 1000 mg via INTRAVENOUS
  Filled 2017-07-29: qty 1100

## 2017-07-29 MED ORDER — CEFAZOLIN SODIUM-DEXTROSE 2-4 GM/100ML-% IV SOLN
2.0000 g | INTRAVENOUS | Status: AC
  Administered 2017-07-29: 2 g via INTRAVENOUS
  Filled 2017-07-29: qty 100

## 2017-07-29 MED ORDER — FLEET ENEMA 7-19 GM/118ML RE ENEM: 1.0000 | ENEMA | Freq: Once | RECTAL | Status: DC | PRN

## 2017-07-29 MED ORDER — RINGERS IRRIGATION IR SOLN
Status: DC | PRN
  Administered 2017-07-29: 1000 mL

## 2017-07-29 MED ORDER — MIDAZOLAM HCL 2 MG/2ML IJ SOLN
INTRAMUSCULAR | Status: AC
  Filled 2017-07-29: qty 2

## 2017-07-29 MED ORDER — ACETAMINOPHEN 10 MG/ML IV SOLN
1000.0000 mg | Freq: Once | INTRAVENOUS | Status: AC
  Administered 2017-07-29: 1000 mg via INTRAVENOUS
  Filled 2017-07-29: qty 100

## 2017-07-29 MED ORDER — METHOCARBAMOL 500 MG PO TABS
500.0000 mg | ORAL_TABLET | Freq: Four times a day (QID) | ORAL | Status: DC | PRN
  Administered 2017-07-29 – 2017-07-31 (×5): 500 mg via ORAL
  Filled 2017-07-29 (×5): qty 1

## 2017-07-29 MED ORDER — ONDANSETRON HCL 4 MG/2ML IJ SOLN
INTRAMUSCULAR | Status: DC | PRN
  Administered 2017-07-29: 4 mg via INTRAVENOUS

## 2017-07-29 MED ORDER — CHLORHEXIDINE GLUCONATE 4 % EX LIQD: 60.0000 mL | Freq: Once | CUTANEOUS | Status: DC

## 2017-07-29 MED ORDER — BISACODYL 10 MG RE SUPP: 10.0000 mg | Freq: Every day | RECTAL | Status: DC | PRN

## 2017-07-29 MED ORDER — ACETAMINOPHEN 650 MG RE SUPP: 650.0000 mg | RECTAL | Status: DC | PRN

## 2017-07-29 MED ORDER — METOCLOPRAMIDE HCL 5 MG/ML IJ SOLN: 5.0000 mg | Freq: Three times a day (TID) | INTRAMUSCULAR | Status: DC | PRN

## 2017-07-29 SURGICAL SUPPLY — 50 items
BAG DECANTER FOR FLEXI CONT (MISCELLANEOUS) ×3 IMPLANT
BAG ZIPLOCK 12X15 (MISCELLANEOUS) ×3 IMPLANT
BANDAGE ACE 6X5 VEL STRL LF (GAUZE/BANDAGES/DRESSINGS) ×3 IMPLANT
BLADE SAG 18X100X1.27 (BLADE) ×3 IMPLANT
BLADE SAW SGTL 11.0X1.19X90.0M (BLADE) ×3 IMPLANT
BOWL SMART MIX CTS (DISPOSABLE) ×3 IMPLANT
CAP KNEE TOTAL 3 SIGMA ×3 IMPLANT
CEMENT HV SMART SET (Cement) ×6 IMPLANT
CLOSURE WOUND 1/2 X4 (GAUZE/BANDAGES/DRESSINGS) ×1
COVER SURGICAL LIGHT HANDLE (MISCELLANEOUS) ×3 IMPLANT
CUFF TOURN SGL QUICK 34 (TOURNIQUET CUFF) ×2
CUFF TRNQT CYL 34X4X40X1 (TOURNIQUET CUFF) ×1 IMPLANT
DECANTER SPIKE VIAL GLASS SM (MISCELLANEOUS) ×3 IMPLANT
DRAPE U-SHAPE 47X51 STRL (DRAPES) ×3 IMPLANT
DRSG ADAPTIC 3X8 NADH LF (GAUZE/BANDAGES/DRESSINGS) ×3 IMPLANT
DRSG PAD ABDOMINAL 8X10 ST (GAUZE/BANDAGES/DRESSINGS) ×3 IMPLANT
DURAPREP 26ML APPLICATOR (WOUND CARE) ×3 IMPLANT
ELECT REM PT RETURN 15FT ADLT (MISCELLANEOUS) ×3 IMPLANT
EVACUATOR 1/8 PVC DRAIN (DRAIN) ×3 IMPLANT
GAUZE SPONGE 4X4 12PLY STRL (GAUZE/BANDAGES/DRESSINGS) ×3 IMPLANT
GLOVE BIO SURGEON STRL SZ7.5 (GLOVE) IMPLANT
GLOVE BIO SURGEON STRL SZ8 (GLOVE) ×3 IMPLANT
GLOVE BIOGEL PI IND STRL 6.5 (GLOVE) IMPLANT
GLOVE BIOGEL PI IND STRL 8 (GLOVE) ×1 IMPLANT
GLOVE BIOGEL PI INDICATOR 6.5 (GLOVE)
GLOVE BIOGEL PI INDICATOR 8 (GLOVE) ×2
GLOVE SURG SS PI 6.5 STRL IVOR (GLOVE) IMPLANT
GOWN STRL REUS W/TWL LRG LVL3 (GOWN DISPOSABLE) ×3 IMPLANT
GOWN STRL REUS W/TWL XL LVL3 (GOWN DISPOSABLE) IMPLANT
HANDPIECE INTERPULSE COAX TIP (DISPOSABLE) ×2
IMMOBILIZER KNEE 20 (SOFTGOODS) ×3
IMMOBILIZER KNEE 20 THIGH 36 (SOFTGOODS) ×1 IMPLANT
MANIFOLD NEPTUNE II (INSTRUMENTS) ×3 IMPLANT
NS IRRIG 1000ML POUR BTL (IV SOLUTION) ×3 IMPLANT
PACK TOTAL KNEE CUSTOM (KITS) ×3 IMPLANT
PAD ABD 8X10 STRL (GAUZE/BANDAGES/DRESSINGS) ×3 IMPLANT
PADDING CAST COTTON 6X4 STRL (CAST SUPPLIES) ×6 IMPLANT
POSITIONER SURGICAL ARM (MISCELLANEOUS) ×3 IMPLANT
SET HNDPC FAN SPRY TIP SCT (DISPOSABLE) ×1 IMPLANT
STRIP CLOSURE SKIN 1/2X4 (GAUZE/BANDAGES/DRESSINGS) ×2 IMPLANT
SUT MNCRL AB 4-0 PS2 18 (SUTURE) ×3 IMPLANT
SUT STRATAFIX 0 PDS 27 VIOLET (SUTURE) ×3
SUT VIC AB 2-0 CT1 27 (SUTURE) ×6
SUT VIC AB 2-0 CT1 TAPERPNT 27 (SUTURE) ×3 IMPLANT
SUTURE STRATFX 0 PDS 27 VIOLET (SUTURE) ×1 IMPLANT
SYR 30ML LL (SYRINGE) ×6 IMPLANT
TRAY FOLEY W/METER SILVER 16FR (SET/KITS/TRAYS/PACK) ×3 IMPLANT
WATER STERILE IRR 1000ML POUR (IV SOLUTION) ×6 IMPLANT
WRAP KNEE MAXI GEL POST OP (GAUZE/BANDAGES/DRESSINGS) ×3 IMPLANT
YANKAUER SUCT BULB TIP 10FT TU (MISCELLANEOUS) ×3 IMPLANT
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
## 2017-07-29 NOTE — Plan of Care (Signed)
Plan of care discussed with patient Sandra [name]

## 2017-07-29 NOTE — Anesthesia Procedure Notes (Signed)
Anesthesia Regional Block: Adductor canal block   Pre-Anesthetic Checklist: ,, timeout performed, Correct Patient, Correct Site, Correct Laterality, Correct Procedure, Correct Position, site marked, Risks and benefits discussed,  Surgical consent,  Pre-op evaluation,  At surgeon's request and post-op pain management  Laterality: Right  Prep: chloraprep       Needles:  Injection technique: Single-shot  Needle Type: Echogenic Needle     Needle Length: 9cm  Needle Gauge: 21     Additional Needles:   Procedures:,,,, ultrasound used (permanent image in chart),,,,  Narrative:  Start time: 07/29/2017 7:00 AM End time: 07/29/2017 7:10 AM Injection made incrementally with aspirations every 5 mL.  Performed by: Personally  Anesthesiologist: Effie Berkshire, MD  Additional Notes: Patient tolerated the procedure well. Local anesthetic introduced in an incremental fashion under minimal resistance after negative aspirations. No paresthesias were elicited. After completion of the procedure, no acute issues were identified and patient continued to be monitored by RN.

## 2017-07-29 NOTE — Anesthesia Procedure Notes (Addendum)
Procedure Name: LMA Insertion Date/Time: 07/29/2017 7:23 AM Performed by: Victoriano Lain, CRNA Pre-anesthesia Checklist: Patient identified, Emergency Drugs available, Suction available, Patient being monitored and Timeout performed Patient Re-evaluated:Patient Re-evaluated prior to induction Oxygen Delivery Method: Circle system utilized Preoxygenation: Pre-oxygenation with 100% oxygen Induction Type: IV induction LMA: LMA with gastric port inserted LMA Size: 4.0 Number of attempts: 1 Placement Confirmation: positive ETCO2 and breath sounds checked- equal and bilateral Tube secured with: Tape Dental Injury: Teeth and Oropharynx as per pre-operative assessment

## 2017-07-29 NOTE — Anesthesia Postprocedure Evaluation (Signed)
Anesthesia Post Note  Patient: Sandra [name]  Procedure(s) Performed: RIGHT TOTAL KNEE ARTHROPLASTY (Right Knee)     Patient location during evaluation: PACU Anesthesia Type: General and Regional Level of consciousness: awake and alert Pain management: pain level controlled Vital Signs Assessment: post-procedure vital signs reviewed and stable Respiratory status: spontaneous breathing, nonlabored ventilation, respiratory function stable and patient connected to nasal cannula oxygen Cardiovascular status: blood pressure returned to baseline and stable Postop Assessment: no apparent nausea or vomiting Anesthetic complications: no    Last Vitals:  Vitals:   07/29/17 1156 07/29/17 1301  BP: (!) 153/77 127/76  Pulse: 71 73  Resp: 14 12  Temp: 36.4 C 36.5 C  SpO2: 100% 100%    Last Pain:  Vitals:   07/29/17 1301  TempSrc: Oral  PainSc:                  Effie Berkshire

## 2017-07-29 NOTE — Plan of Care (Signed)
Plan of care discussed.   

## 2017-07-29 NOTE — Transfer of Care (Signed)
Immediate Anesthesia Transfer of Care Note  Patient: CERENITI CURB  Procedure(s) Performed: RIGHT TOTAL KNEE ARTHROPLASTY (Right Knee)  Patient Location: PACU  Anesthesia Type:General  Level of Consciousness: awake, alert , oriented and patient cooperative  Airway & Oxygen Therapy: Patient Spontanous Breathing and Patient connected to face mask oxygen  Post-op Assessment: Report given to RN, Post -op Vital signs reviewed and stable and Patient moving all extremities  Post vital signs: Reviewed and stable  Last Vitals:  Vitals:   07/29/17 0600  BP: 111/69  Pulse: 65  Resp: 16  Temp: 36.6 C  SpO2: 95%    Last Pain:  Vitals:   07/29/17 0600  TempSrc: Oral      Patients Stated Pain Goal: 4 (20/72/18 2883)  Complications: No apparent anesthesia complications

## 2017-07-29 NOTE — Op Note (Signed)
OPERATIVE REPORT-TOTAL KNEE ARTHROPLASTY   Pre-operative diagnosis- Osteoarthritis  Right knee(s)  Post-operative diagnosis- Osteoarthritis Right knee(s)  Procedure-  Right  Total Knee Arthroplasty  Surgeon- Dione Plover. Malasha Kleppe, MD  Assistant- Ardeen Jourdain, PA-C   Anesthesia-  GA combined with regional for post-op pain  EBL-25 ml   Drains Hemovac  Tourniquet time-  Total Tourniquet Time Documented: Thigh (Right) - 31 minutes Total: Thigh (Right) - 31 minutes     Complications- None  Condition-PACU - hemodynamically stable.   Brief Clinical Note  Sandra [name] is a 77 y.o. year old female with end stage OA of her right knee with progressively worsening pain and dysfunction. She has constant pain, with activity and at rest and significant functional deficits with difficulties even with ADLs. She has had extensive non-op management including analgesics, injections of cortisone and viscosupplements, and home exercise program, but remains in significant pain with significant dysfunction.Radiographs show bone on bone arthritis lateral and patellofemoral. She presents now for right Total Knee Arthroplasty.    Procedure in detail---   The patient is brought into the operating room and positioned supine on the operating table. After successful administration of  GA combined with regional for post-op pain,   a tourniquet is placed high on the  Right thigh(s) and the lower extremity is prepped and draped in the usual sterile fashion. Time out is performed by the operating team and then the  Right lower extremity is wrapped in Esmarch, knee flexed and the tourniquet inflated to 300 mmHg.       A midline incision is made with a ten blade through the subcutaneous tissue to the level of the extensor mechanism. A fresh blade is used to make a medial parapatellar arthrotomy. Soft tissue over the proximal medial tibia is subperiosteally elevated to the joint line with a knife and into the  semimembranosus bursa with a Cobb elevator. Soft tissue over the proximal lateral tibia is elevated with attention being paid to avoiding the patellar tendon on the tibial tubercle. The patella is everted, knee flexed 90 degrees and the ACL and PCL are removed. Findings are bone on bone lateral and patellofemoral with large global osteophytes.        The drill is used to create a starting hole in the distal femur and the canal is thoroughly irrigated with sterile saline to remove the fatty contents. The 5 degree Right  valgus alignment guide is placed into the femoral canal and the distal femoral cutting block is pinned to remove 10 mm off the distal femur. Resection is made with an oscillating saw.      The tibia is subluxed forward and the menisci are removed. The extramedullary alignment guide is placed referencing proximally at the medial aspect of the tibial tubercle and distally along the second metatarsal axis and tibial crest. The block is pinned to remove 34mm off the more deficient lateral  side. Resection is made with an oscillating saw. Size 3is the most appropriate size for the tibia and the proximal tibia is prepared with the modular drill and keel punch for that size.      The femoral sizing guide is placed and size 2.5 is most appropriate. Rotation is marked off the epicondylar axis and confirmed by creating a rectangular flexion gap at 90 degrees. The size 2.5 cutting block is pinned in this rotation and the anterior, posterior and chamfer cuts are made with the oscillating saw. The intercondylar block is then placed and that cut  is made.      Trial size 3 tibial component, trial size 2.5 posterior stabilized femur and a 15  mm posterior stabilized rotating platform insert trial is placed. Full extension is achieved with excellent varus/valgus and anterior/posterior balance throughout full range of motion. The patella is everted and thickness measured to be 22  mm. Free hand resection is taken to  12 mm, a 35 template is placed, lug holes are drilled, trial patella is placed, and it tracks normally. Osteophytes are removed off the posterior femur with the trial in place. All trials are removed and the cut bone surfaces prepared with pulsatile lavage. Cement is mixed and once ready for implantation, the size 3 tibial implant, size  2.5 posterior stabilized femoral component, and the size 35 patella are cemented in place and the patella is held with the clamp. The trial insert is placed and the knee held in full extension. The Exparel (20 ml mixed with 60 ml saline) is injected into the extensor mechanism, posterior capsule, medial and lateral gutters and subcutaneous tissues.  All extruded cement is removed and once the cement is hard the permanent 15 mm posterior stabilized rotating platform insert is placed into the tibial tray.      The wound is copiously irrigated with saline solution and the extensor mechanism closed over a hemovac drain with #1 V-loc suture. The tourniquet is released for a total tourniquet time of 31  minutes. Flexion against gravity is 140 degrees and the patella tracks normally. Subcutaneous tissue is closed with 2.0 vicryl and subcuticular with running 4.0 Monocryl. The incision is cleaned and dried and steri-strips and a bulky sterile dressing are applied. The limb is placed into a knee immobilizer and the patient is awakened and transported to recovery in stable condition.      Please note that a surgical assistant was a medical necessity for this procedure in order to perform it in a safe and expeditious manner. Surgical assistant was necessary to retract the ligaments and vital neurovascular structures to prevent injury to them and also necessary for proper positioning of the limb to allow for anatomic placement of the prosthesis.   Dione Plover Anapaula Severt, MD    07/29/2017, 8:16 AM

## 2017-07-29 NOTE — Interval H&P Note (Signed)
History and Physical Interval Note:  07/29/2017 6:25 AM  Sandra [name]  has presented today for surgery, with the diagnosis of Osteoarthritis Right Knee  The various methods of treatment have been discussed with the patient and family. After consideration of risks, benefits and other options for treatment, the patient has consented to  Procedure(s): RIGHT TOTAL KNEE ARTHROPLASTY (Right) as a surgical intervention .  The patient's history has been reviewed, patient examined, no change in status, stable for surgery.  I have reviewed the patient's chart and labs.  Questions were answered to the patient's satisfaction.     Pilar Plate Shermeka Rutt

## 2017-07-29 NOTE — Evaluation (Signed)
Physical Therapy Evaluation Patient Details Name: Sandra [name] MRN: [id number] DOB: 09-29-1940 Today's Date: 07/29/2017   History of Present Illness  Pt s/p R TKR and with hx of chronic back pain and extensive back surgery.    Clinical Impression  Pt s/p R TKR and presents with decreased R LE strength/ROM and post op pain limiting functional mobility.  Pt should progress to dc home with assist of family.    Follow Up Recommendations DC plan and follow up therapy as arranged by surgeon    Equipment Recommendations  None recommended by PT    Recommendations for Other Services OT consult     Precautions / Restrictions Precautions Precautions: Fall;Knee Required Braces or Orthoses: Knee Immobilizer - Right Knee Immobilizer - Right: Discontinue once straight leg raise with < 10 degree lag Restrictions Weight Bearing Restrictions: No Other Position/Activity Restrictions: WBAT      Mobility  Bed Mobility Overal bed mobility: Needs Assistance Bed Mobility: Supine to Sit     Supine to sit: Min assist;Mod assist     General bed mobility comments: cues for sequence and use of L LE to self assist  Transfers Overall transfer level: Needs assistance Equipment used: Rolling walker (2 wheeled) Transfers: Sit to/from Stand Sit to Stand: Min assist;From elevated surface         General transfer comment: cues for LE management and use of UEs to self assist  Ambulation/Gait Ambulation/Gait assistance: Min assist Ambulation Distance (Feet): 18 Feet Assistive device: Rolling walker (2 wheeled) Gait Pattern/deviations: Step-to pattern;Decreased step length - right;Decreased step length - left;Shuffle;Trunk flexed Gait velocity: dec Gait velocity interpretation: Below normal speed for age/gender General Gait Details: cues for sequence, posture and position from [employer]            Wheelchair Mobility    Modified Rankin (Stroke Patients Only)       Balance                                              Pertinent Vitals/Pain Pain Assessment: 0-10 Pain Score: 7  Pain Location: R knee/thigh Pain Descriptors / Indicators: Aching;Sore Pain Intervention(s): Limited activity within patient's tolerance;Monitored during session;Premedicated before session;Ice applied    Home Living Family/patient expects to be discharged to:: Private residence Living Arrangements: Spouse/significant other Available Help at Discharge: Family Type of Home: House Home Access: Stairs to enter Entrance Stairs-Rails: Right;Left;Can reach both Entrance Stairs-Number of Steps: 3 Home Layout: One level Home Equipment: Environmental consultant - 2 wheels;Cane - single point;Bedside commode Additional Comments: Sister will be assisting    Prior Function Level of Independence: Independent               Hand Dominance        Extremity/Trunk Assessment   Upper Extremity Assessment Upper Extremity Assessment: Overall WFL for tasks assessed    Lower Extremity Assessment Lower Extremity Assessment: RLE deficits/detail    Cervical / Trunk Assessment Cervical / Trunk Assessment: Kyphotic  Communication   Communication: No difficulties  Cognition Arousal/Alertness: Awake/alert Behavior During Therapy: WFL for tasks assessed/performed Overall Cognitive Status: Within Functional Limits for tasks assessed                                        General Comments  Exercises Total Joint Exercises Ankle Circles/Pumps: AROM;Both;15 reps;Supine   Assessment/Plan    PT Assessment Patient needs continued PT services  PT Problem List Decreased strength;Decreased range of motion;Decreased activity tolerance;Decreased mobility;Pain;Decreased knowledge of use of DME       PT Treatment Interventions DME instruction;Gait training;Stair training;Functional mobility training;Therapeutic activities;Therapeutic exercise;Patient/family education    PT  Goals (Current goals can be found in the Care Plan section)  Acute Rehab PT Goals Patient Stated Goal: Regain IND  PT Goal Formulation: With patient Time For Goal Achievement: 08/05/17 Potential to Achieve Goals: Good    Frequency 7X/week   Barriers to discharge        Co-evaluation               AM-PAC PT "6 Clicks" Daily Activity  Outcome Measure Difficulty turning over in bed (including adjusting bedclothes, sheets and blankets)?: Unable Difficulty moving from lying on back to sitting on the side of the bed? : Unable Difficulty sitting down on and standing up from a chair with arms (e.g., wheelchair, bedside commode, etc,.)?: Unable Help needed moving to and from a bed to chair (including a wheelchair)?: A Lot Help needed walking in hospital room?: A Little Help needed climbing 3-5 steps with a railing? : A Lot 6 Click Score: 10    End of Session Equipment Utilized During Treatment: Gait belt;Right knee immobilizer Activity Tolerance: Patient tolerated treatment well;Patient limited by pain Patient left: in chair;with call bell/phone within reach;with family/visitor present Nurse Communication: Mobility status PT Visit Diagnosis: Difficulty in walking, not elsewhere classified (R26.2)    Time: 1419-1450 PT Time Calculation (min) (ACUTE ONLY): 31 min   Charges:   PT Evaluation $PT Eval Low Complexity: 1 Low PT Treatments $Gait Training: 8-22 mins   PT G Codes:        Pg [phone]   Nga Rabon 07/29/2017, 3:16 PM

## 2017-07-30 ENCOUNTER — Encounter (HOSPITAL_COMMUNITY): Payer: Self-pay | Admitting: Orthopedic Surgery

## 2017-07-30 LAB — BASIC METABOLIC PANEL
ANION GAP: 6 (ref 5–15)
BUN: 16 mg/dL (ref 6–20)
CHLORIDE: 108 mmol/L (ref 101–111)
CO2: 24 mmol/L (ref 22–32)
Calcium: 8 mg/dL — ABNORMAL LOW (ref 8.9–10.3)
Creatinine, Ser: 0.81 mg/dL (ref 0.44–1.00)
GFR calc non Af Amer: >60 mL/min (ref >60)
Glucose, Bld: 128 mg/dL — ABNORMAL HIGH (ref 65–99)
Potassium: 3.7 mmol/L (ref 3.5–5.1)
Sodium: 138 mmol/L (ref 135–145)

## 2017-07-30 LAB — CBC
HCT: 29.9 % — ABNORMAL LOW (ref 36.0–46.0)
HEMOGLOBIN: 9.9 g/dL — AB (ref 12.0–15.0)
MCH: 30.6 pg (ref 26.0–34.0)
MCHC: 33.1 g/dL (ref 30.0–36.0)
MCV: 92.3 fL (ref 78.0–100.0)
Platelets: 181 10*3/uL (ref 150–400)
RBC: 3.24 MIL/uL — ABNORMAL LOW (ref 3.87–5.11)
RDW: 13.1 % (ref 11.5–15.5)
WBC: 8.7 10*3/uL (ref 4.0–10.5)

## 2017-07-30 MED ORDER — METHOCARBAMOL 500 MG PO TABS: 500.0000 mg | ORAL_TABLET | Freq: Four times a day (QID) | ORAL | 0 refills | Status: DC | PRN

## 2017-07-30 MED ORDER — TRAMADOL HCL 50 MG PO TABS: 50.0000 mg | ORAL_TABLET | Freq: Four times a day (QID) | ORAL | 0 refills | Status: DC | PRN

## 2017-07-30 MED ORDER — OXYCODONE HCL 10 MG PO TABS: 10.0000 mg | ORAL_TABLET | ORAL | 0 refills | Status: DC | PRN

## 2017-07-30 MED ORDER — SODIUM CHLORIDE 0.9 % IV BOLUS (SEPSIS)
250.0000 mL | Freq: Once | INTRAVENOUS | Status: AC
  Administered 2017-07-30: 250 mL via INTRAVENOUS

## 2017-07-30 MED ORDER — RIVAROXABAN 10 MG PO TABS: 10.0000 mg | ORAL_TABLET | Freq: Every day | ORAL | 0 refills | Status: DC

## 2017-07-30 NOTE — Evaluation (Signed)
Occupational Therapy Evaluation Patient Details Name: Sandra [name] MRN: [id number] DOB: 10/30/1940 Today's Date: 07/30/2017    History of Present Illness Pt s/p R TKR and with hx of chronic back pain and extensive back surgery.     Clinical Impression   This 77 year old female was admitted for the above sx.  She will benefit from continued OT in acute to further educate on shower transfer and to educate on sock aide as she had difficulty with this prior to sx due to back pain.  Goals in acute are for supervision to min guard level    Follow Up Recommendations  Supervision/Assistance - 24 hour    Equipment Recommendations  None recommended by OT    Recommendations for Other Services       Precautions / Restrictions Precautions Precautions: Fall;Knee Required Braces or Orthoses: Knee Immobilizer - Right Knee Immobilizer - Right: Discontinue once straight leg raise with < 10 degree lag Restrictions Weight Bearing Restrictions: No Other Position/Activity Restrictions: WBAT      Mobility Bed Mobility               General bed mobility comments: oob  Transfers   Equipment used: Rolling walker (2 wheeled)   Sit to Stand: Min assist         General transfer comment: cues for UE/LE placement    Balance                                           ADL either performed or assessed with clinical judgement   ADL Overall ADL's : Needs assistance/impaired     Grooming: Supervision/safety;Standing       Lower Body Bathing: Moderate assistance;Sit to/from stand       Lower Body Dressing: Maximal assistance;Sit to/from stand   Toilet Transfer: Minimal assistance;Ambulation;BSC   Toileting- Clothing Manipulation and Hygiene: Minimal assistance;Sit to/from stand         General ADL Comments: ambulated to bathroom and used commode.  Pt has difficulty with socks at baseline due to back pain.  She has a Secondary school teacher. Sister will assist. Will try sock  aide for normal socks on next visit--to see if she likes this for the future     Vision         Perception     Praxis      Pertinent Vitals/Pain Pain Score: 9  Pain Location: R knee/thigh Pain Descriptors / Indicators: Aching;Sore Pain Intervention(s): Limited activity within patient's tolerance;Monitored during session;Premedicated before session;Repositioned;Ice applied     Hand Dominance Right   Extremity/Trunk Assessment Upper Extremity Assessment Upper Extremity Assessment: Overall WFL for tasks assessed           Communication Communication Communication: No difficulties   Cognition Arousal/Alertness: Awake/alert Behavior During Therapy: WFL for tasks assessed/performed Overall Cognitive Status: Within Functional Limits for tasks assessed                                     General Comments       Exercises     Shoulder Instructions      Home Living Family/patient expects to be discharged to:: Private residence Living Arrangements: Spouse/significant other Available Help at Discharge: Family               Bathroom Shower/Tub: Walk-in  shower   Bathroom Toilet: Handicapped height     Home Equipment: Walker - 2 wheels;Cane - single point;Bedside commode   Additional Comments: Sister will be assisting      Prior Functioning/Environment Level of Independence: Independent                 OT Problem List: Decreased strength;Decreased activity tolerance;Pain;Decreased knowledge of use of DME or AE;Decreased knowledge of precautions      OT Treatment/Interventions: Self-care/ADL training;DME and/or AE instruction;Patient/family education;Therapeutic activities    OT Goals(Current goals can be found in the care plan section) Acute Rehab OT Goals Patient Stated Goal: Regain IND  OT Goal Formulation: With patient Time For Goal Achievement: 08/06/17 Potential to Achieve Goals: Good ADL Goals Pt Will Transfer to Toilet: with  min guard assist;ambulating;bedside commode Pt Will Perform Tub/Shower Transfer: Shower transfer;with min guard assist;ambulating;shower seat Additional ADL Goal #1: pt will don regular socks with sock aide with set up/supervision  OT Frequency: Min 2X/week   Barriers to D/C:            Co-evaluation              AM-PAC PT "6 Clicks" Daily Activity     Outcome Measure Help from another person eating meals?: None Help from another person taking care of personal grooming?: A Little Help from another person toileting, which includes using toliet, bedpan, or urinal?: A Little Help from another person bathing (including washing, rinsing, drying)?: A Lot Help from another person to put on and taking off regular upper body clothing?: A Little Help from another person to put on and taking off regular lower body clothing?: A Lot 6 Click Score: 17   End of Session    Activity Tolerance: Patient tolerated treatment well Patient left: in chair;with call bell/phone within reach;with family/visitor present  OT Visit Diagnosis: Pain Pain - Right/Left: Right Pain - part of body: Knee                Time: 1031-1057 OT Time Calculation (min): 26 min Charges:  OT General Charges $OT Visit: 1 Visit OT Evaluation $OT Eval Low Complexity: 1 Low OT Treatments $Self Care/Home Management : 8-22 mins G-Codes:     [place], OTR/L [phone] 07/30/2017  Timithy Arons 07/30/2017, 11:32 AM

## 2017-07-30 NOTE — Discharge Summary (Signed)
Physician Discharge Summary   Patient ID: Sandra [name] MRN: [id number] DOB/AGE: March 26, 1941 77 y.o.  Admit date: 07/29/2017 Discharge date: 07/31/2017  Primary Diagnosis:  Osteoarthritis Right knee(s)   Admission Diagnoses:  Past Medical History:  Diagnosis Date  . Arthritis    back   . Cataracts, bilateral   . Chronic back pain    scoliosis/degenerative disc  . Complication of anesthesia    slow to wake up and combative in April 09/in Dec 09 she did much better  . DVT (deep venous thrombosis) ([place])    mid 80's  . Headache    occasionally  . History of bronchitis 5+yrs ago  . History of MRSA infection 2009  . Osteopenia   . Urinary urgency   . Weakness    numbness and tingling in both legs   Discharge Diagnoses:   Principal Problem:   OA (osteoarthritis) of knee  Estimated body mass index is 30.36 kg/m as calculated from the following:   Height as of this encounter: '5\' 2"'  (1.575 m).   Weight as of this encounter: 75.3 kg (166 lb).  Procedure:  Procedure(s) (LRB): RIGHT TOTAL KNEE ARTHROPLASTY (Right)   Consults: None  HPI: Sandra [name] is a 77 y.o. year old female with end stage OA of her right knee with progressively worsening pain and dysfunction. She has constant pain, with activity and at rest and significant functional deficits with difficulties even with ADLs. She has had extensive non-op management including analgesics, injections of cortisone and viscosupplements, and home exercise program, but remains in significant pain with significant dysfunction.Radiographs show bone on bone arthritis lateral and patellofemoral. She presents now for right Total Knee Arthroplasty.     Laboratory Data: Admission on 07/29/2017  Component Date Value Ref Range Status  . WBC 07/30/2017 8.7  4.0 - 10.5 K/uL Final  . RBC 07/30/2017 3.24* 3.87 - 5.11 MIL/uL Final  . Hemoglobin 07/30/2017 9.9* 12.0 - 15.0 g/dL Final  . HCT 07/30/2017 29.9* 36.0 - 46.0 % Final  . MCV  07/30/2017 92.3  78.0 - 100.0 fL Final  . MCH 07/30/2017 30.6  26.0 - 34.0 pg Final  . MCHC 07/30/2017 33.1  30.0 - 36.0 g/dL Final  . RDW 07/30/2017 13.1  11.5 - 15.5 % Final  . Platelets 07/30/2017 181  150 - 400 K/uL Final  . Sodium 07/30/2017 138  135 - 145 mmol/L Final  . Potassium 07/30/2017 3.7  3.5 - 5.1 mmol/L Final  . Chloride 07/30/2017 108  101 - 111 mmol/L Final  . CO2 07/30/2017 24  22 - 32 mmol/L Final  . Glucose, Bld 07/30/2017 128* 65 - 99 mg/dL Final  . BUN 07/30/2017 16  6 - 20 mg/dL Final  . Creatinine, Ser 07/30/2017 0.81  0.44 - 1.00 mg/dL Final  . Calcium 07/30/2017 8.0* 8.9 - 10.3 mg/dL Final  . GFR calc non Af Amer 07/30/2017 >60  >60 mL/min Final  . GFR calc Af Amer 07/30/2017 >60  >60 mL/min Final   Comment: (NOTE) The eGFR has been calculated using the CKD EPI equation. This calculation has not been validated in all clinical situations. eGFR's persistently <60 mL/min signify possible Chronic Kidney Disease.   Georgiann Hahn gap 07/30/2017 6  5 - 15 Final  Hospital Outpatient Visit on 07/24/2017  Component Date Value Ref Range Status  . aPTT 07/24/2017 30  24 - 36 seconds Final  . WBC 07/24/2017 5.9  4.0 - 10.5 K/uL Final  . RBC 07/24/2017 4.46  3.87 - 5.11 MIL/uL Final  . Hemoglobin 07/24/2017 13.2  12.0 - 15.0 g/dL Final  . HCT 07/24/2017 41.1  36.0 - 46.0 % Final  . MCV 07/24/2017 92.2  78.0 - 100.0 fL Final  . MCH 07/24/2017 29.6  26.0 - 34.0 pg Final  . MCHC 07/24/2017 32.1  30.0 - 36.0 g/dL Final  . RDW 07/24/2017 12.8  11.5 - 15.5 % Final  . Platelets 07/24/2017 212  150 - 400 K/uL Final  . Sodium 07/24/2017 140  135 - 145 mmol/L Final  . Potassium 07/24/2017 4.2  3.5 - 5.1 mmol/L Final  . Chloride 07/24/2017 104  101 - 111 mmol/L Final  . CO2 07/24/2017 29  22 - 32 mmol/L Final  . Glucose, Bld 07/24/2017 60* 65 - 99 mg/dL Final  . BUN 07/24/2017 13  6 - 20 mg/dL Final  . Creatinine, Ser 07/24/2017 0.96  0.44 - 1.00 mg/dL Final  . Calcium 07/24/2017  9.3  8.9 - 10.3 mg/dL Final  . Total Protein 07/24/2017 7.3  6.5 - 8.1 g/dL Final  . Albumin 07/24/2017 3.8  3.5 - 5.0 g/dL Final  . AST 07/24/2017 26  15 - 41 U/L Final  . ALT 07/24/2017 18  14 - 54 U/L Final  . Alkaline Phosphatase 07/24/2017 65  38 - 126 U/L Final  . Total Bilirubin 07/24/2017 0.6  0.3 - 1.2 mg/dL Final  . GFR calc non Af Amer 07/24/2017 57* >60 mL/min Final  . GFR calc Af Amer 07/24/2017 >60  >60 mL/min Final   Comment: (NOTE) The eGFR has been calculated using the CKD EPI equation. This calculation has not been validated in all clinical situations. eGFR's persistently <60 mL/min signify possible Chronic Kidney Disease.   . Anion gap 07/24/2017 7  5 - 15 Final  . Prothrombin Time 07/24/2017 13.2  11.4 - 15.2 seconds Final  . INR 07/24/2017 1.01   Final  . ABO/RH(D) 07/24/2017 O POS   Final  . Antibody Screen 07/24/2017 NEG   Final  . Sample Expiration 07/24/2017 08/01/2017   Final  . Extend sample reason 07/24/2017 NO TRANSFUSIONS OR PREGNANCY IN THE PAST 3 MONTHS   Final  . MRSA, PCR 07/24/2017 NEGATIVE  NEGATIVE Final  . Staphylococcus aureus 07/24/2017 NEGATIVE  NEGATIVE Final   Comment: (NOTE) The Xpert SA Assay (FDA approved for NASAL specimens in patients 12 years of age and older), is one component of a comprehensive surveillance program. It is not intended to diagnose infection nor to guide or monitor treatment.   . ABO/RH(D) 07/24/2017 O POS   Final     X-Rays:No results found.  EKG: Orders placed or performed during the hospital encounter of 09/05/13  . EKG 12-Lead  . EKG 12-Lead  . ED EKG  . ED EKG  . EKG 12-Lead  . EKG 12-Lead  . ED EKG  . ED EKG  . EKG     Hospital Course: Sandra [name] is a 77 y.o. who was admitted to [facility]. They were brought to the operating room on 07/29/2017 and underwent Procedure(s): RIGHT TOTAL KNEE ARTHROPLASTY.  Patient tolerated the procedure well and was later transferred to the recovery room  and then to the orthopaedic floor for postoperative care.  They were given PO and IV analgesics for pain control following their surgery.  They were given 24 hours of postoperative antibiotics of  Anti-infectives (From admission, onward)   Start     Dose/Rate Route Frequency Ordered Stop  07/29/17 1400  ceFAZolin (ANCEF) IVPB 1 g/50 mL premix     1 g 100 mL/hr over 30 Minutes Intravenous Every 6 hours 07/29/17 1105 07/29/17 2117   07/29/17 0548  ceFAZolin (ANCEF) IVPB 2g/100 mL premix     2 g 200 mL/hr over 30 Minutes Intravenous On call to O.R. 07/29/17 [id number] 07/29/17 0735     and started on DVT prophylaxis in the form of Xarelto.   PT and OT were ordered for total joint protocol.  Discharge planning consulted to help with postop disposition and equipment needs.  Patient had a decent night on the evening of surgery.  They started to get up OOB with therapy on day one. Hemovac drain was pulled without difficulty.  Continued to work with therapy into day two.  Dressing was changed on day two and the incision was healing well.  Patient was seen in rounds on day two and was ready to go home.   Diet - Regular diet Follow up - in 2 weeks Activity - WBAT Disposition - Home Condition Upon Discharge - Stable D/C Meds - See DC Summary DVT Prophylaxis - Xarelto     Discharge Instructions    Call MD / Call 911   Complete by:  As directed    If you experience chest pain or shortness of breath, CALL 911 and be transported to the hospital emergency room.  If you develope a fever above 101 F, pus (white drainage) or increased drainage or redness at the wound, or calf pain, call your surgeon's office.   Change dressing   Complete by:  As directed    Change dressing daily with sterile 4 x 4 inch gauze dressing and apply TED hose. Do not submerge the incision under water.   Constipation Prevention   Complete by:  As directed    Drink plenty of fluids.  Prune juice may be helpful.  You may use a stool  softener, such as Colace (over the counter) 100 mg twice a day.  Use MiraLax (over the counter) for constipation as needed.   Diet - low sodium heart healthy   Complete by:  As directed    Discharge instructions   Complete by:  As directed    Take Xarelto for two and a half more weeks, then discontinue Xarelto.  Pick up stool softner and laxative for home use following surgery while on pain medications. Do not submerge incision under water. Please use good hand washing techniques while changing dressing each day. May shower starting three days after surgery. Please use a clean towel to pat the incision dry following showers. Continue to use ice for pain and swelling after surgery. Do not use any lotions or creams on the incision until instructed by your surgeon.  Wear both TED hose on both legs during the day every day for three weeks, but may remove the TED hose at night at home.  Postoperative Constipation Protocol  Constipation - defined medically as fewer than three stools per week and severe constipation as less than one stool per week.  One of the most common issues patients have following surgery is constipation.  Even if you have a regular bowel pattern at home, your normal regimen is likely to be disrupted due to multiple reasons following surgery.  Combination of anesthesia, postoperative narcotics, change in appetite and fluid intake all can affect your bowels.  In order to avoid complications following surgery, here are some recommendations in order to help you during your  recovery period.  Colace (docusate) - Pick up an over-the-counter form of Colace or another stool softener and take twice a day as long as you are requiring postoperative pain medications.  Take with a full glass of water daily.  If you experience loose stools or diarrhea, hold the colace until you stool forms back up.  If your symptoms do not get better within 1 week or if they get worse, check with your  doctor.  Dulcolax (bisacodyl) - Pick up over-the-counter and take as directed by the product packaging as needed to assist with the movement of your bowels.  Take with a full glass of water.  Use this product as needed if not relieved by Colace only.   MiraLax (polyethylene glycol) - Pick up over-the-counter to have on hand.  MiraLax is a solution that will increase the amount of water in your bowels to assist with bowel movements.  Take as directed and can mix with a glass of water, juice, soda, coffee, or tea.  Take if you go more than two days without a movement. Do not use MiraLax more than once per day. Call your doctor if you are still constipated or irregular after using this medication for 7 days in a row.  If you continue to have problems with postoperative constipation, please contact the office for further assistance and recommendations.  If you experience "the worst abdominal pain ever" or develop nausea or vomiting, please contact the office immediatly for further recommendations for treatment.   Do not put a pillow under the knee. Place it under the heel.   Complete by:  As directed    Do not sit on low chairs, stoools or toilet seats, as it may be difficult to get up from low surfaces   Complete by:  As directed    Driving restrictions   Complete by:  As directed    No driving until released by the physician.   Increase activity slowly as tolerated   Complete by:  As directed    Lifting restrictions   Complete by:  As directed    No lifting until released by the physician.   Patient may shower   Complete by:  As directed    You may shower without a dressing once there is no drainage.  Do not wash over the wound.  If drainage remains, do not shower until drainage stops.   TED hose   Complete by:  As directed    Use stockings (TED hose) for 3 weeks on both leg(s).  You may remove them at night for sleeping.   Weight bearing as tolerated   Complete by:  As directed     Laterality:  right   Extremity:  Lower     Allergies as of 07/30/2017      Reactions   Clarithromycin Diarrhea   Codeine Other (See Comments)   Headache   Resinol [dermatological Products, Misc.] Other (See Comments)   Skin was like "raw meat"..Skin peeled off.   Sulfa Antibiotics Rash      Medication List    STOP taking these medications   alendronate 70 MG tablet Commonly known as:  FOSAMAX   multivitamin with minerals Tabs tablet   naproxen 500 MG tablet Commonly known as:  NAPROSYN   VITAMIN D PO     TAKE these medications   gabapentin 400 MG capsule Commonly known as:  NEURONTIN Take 400-800 mg by mouth See admin instructions. Take 400 mg in the morning and  800 mg at night   HM POTASSIUM 595 (99 K) MG Tabs tablet Generic drug:  potassium gluconate Take 595 mg by mouth at bedtime.   loratadine 10 MG tablet Commonly known as:  CLARITIN Take 10 mg by mouth daily as needed for allergies.   methocarbamol 500 MG tablet Commonly known as:  ROBAXIN Take 1 tablet (500 mg total) by mouth every 6 (six) hours as needed for muscle spasms.   Oxycodone HCl 10 MG Tabs Take 1-2 tablets (10-20 mg total) by mouth every 4 (four) hours as needed. For pain What changed:    how much to take  when to take this   REFRESH OP Apply 1 drop to eye 4 (four) times daily as needed (dry eyes).   rivaroxaban 10 MG Tabs tablet Commonly known as:  XARELTO Take 1 tablet (10 mg total) by mouth daily with breakfast. Take Xarelto for two and a half more weeks following discharge from the hospital, then discontinue Xarelto. Once the patient has completed the blood thinner regimen, then take a Baby 81 mg Aspirin daily for three more weeks. Start taking on:  07/31/2017   traMADol 50 MG tablet Commonly known as:  ULTRAM Take 1-2 tablets (50-100 mg total) by mouth every 6 (six) hours as needed (mild pain).            Discharge Care Instructions  (From admission, onward)        Start      Ordered   07/30/17 0000  Weight bearing as tolerated    Question Answer Comment  Laterality right   Extremity Lower      07/30/17 2224   07/30/17 0000  Change dressing    Comments:  Change dressing daily with sterile 4 x 4 inch gauze dressing and apply TED hose. Do not submerge the incision under water.   07/30/17 2224     Follow-up Information    Gaynelle Arabian, MD. Schedule an appointment as soon as possible for a visit on 08/13/2017.   Specialty:  Orthopedic Surgery Contact information: [address] [place] [address] [phone]           Signed: Arlee Muslim, PA-C Orthopaedic Surgery 07/30/2017, 10:25 PM

## 2017-07-30 NOTE — Progress Notes (Signed)
Physical Therapy Treatment Patient Details Name: Sandra [name] MRN: [id number] DOB: Jan 19, 1941 Today's Date: 07/30/2017    History of Present Illness Pt s/p R TKR and with hx of chronic back pain and extensive back surgery.      PT Comments    Progressing with mobility. Pt was able to walk ~50 feet this session. Pain rated 9/10 during session (pt called for pain meds once back in bed). Pt requested to defer ROM exercises until later due to pain. Will continue to follow and progress activity as tolerated. Per pt, plan is for outpatient PT to begin on Friday.     Follow Up Recommendations  DC plan and follow up therapy as arranged by surgeon     Equipment Recommendations  None recommended by PT    Recommendations for Other Services       Precautions / Restrictions Precautions Precautions: Fall;Knee Required Braces or Orthoses: Knee Immobilizer - Right Knee Immobilizer - Right: Discontinue once straight leg raise with < 10 degree lag Restrictions Weight Bearing Restrictions: No Other Position/Activity Restrictions: WBAT    Mobility  Bed Mobility Overal bed mobility: Needs Assistance Bed Mobility: Sit to Supine       Sit to supine: Min guard   General bed mobility comments: close guard for safety. Pt insisted upon performing task unassisted despite reporting her pain as a "9" while ambulating.  Transfers Overall transfer level: Needs assistance Equipment used: Rolling walker (2 wheeled) Transfers: Sit to/from Stand Sit to Stand: Min guard         General transfer comment: close guard for safety. VCS hand/LE placement. Increased time.   Ambulation/Gait Ambulation/Gait assistance: Min assist Ambulation Distance (Feet): 50 Feet Assistive device: Rolling walker (2 wheeled) Gait Pattern/deviations: Step-to pattern     General Gait Details: Intermittent assist to steady. VCs safety, sequence. Slow gait speed.    Stairs            Wheelchair Mobility     Modified Rankin (Stroke Patients Only)       Balance Overall balance assessment: Needs assistance         Standing balance support: Bilateral upper extremity supported Standing balance-Leahy Scale: Poor                              Cognition Arousal/Alertness: Awake/alert Behavior During Therapy: WFL for tasks assessed/performed Overall Cognitive Status: Within Functional Limits for tasks assessed                                        Exercises Total Joint Exercises Ankle Circles/Pumps: AROM;Both;10 reps;Supine    General Comments        Pertinent Vitals/Pain Pain Assessment: 0-10 Pain Score: 8  Pain Location: R knee/thigh Pain Descriptors / Indicators: Aching;Sore Pain Intervention(s): Limited activity within patient's tolerance;Repositioned;Ice applied    Home Living Family/patient expects to be discharged to:: Private residence Living Arrangements: Spouse/significant other Available Help at Discharge: Family         Home Equipment: Gilford Rile - 2 wheels;Cane - single point;Bedside commode Additional Comments: Sister will be assisting    Prior Function Level of Independence: Independent          PT Goals (current goals can now be found in the care plan section) Acute Rehab PT Goals Patient Stated Goal: Regain IND  Progress towards PT goals: Progressing  toward goals    Frequency    7X/week      PT Plan Current plan remains appropriate    Co-evaluation              AM-PAC PT "6 Clicks" Daily Activity  Outcome Measure  Difficulty turning over in bed (including adjusting bedclothes, sheets and blankets)?: Unable Difficulty moving from lying on back to sitting on the side of the bed? : Unable Difficulty sitting down on and standing up from a chair with arms (e.g., wheelchair, bedside commode, etc,.)?: Unable Help needed moving to and from a bed to chair (including a wheelchair)?: A Little Help needed walking in  hospital room?: A Little Help needed climbing 3-5 steps with a railing? : A Lot 6 Click Score: 11    End of Session Equipment Utilized During Treatment: Gait belt;Right knee immobilizer Activity Tolerance: Patient limited by pain Patient left: in bed;with call bell/phone within reach   PT Visit Diagnosis: Difficulty in walking, not elsewhere classified (R26.2);Pain Pain - Right/Left: Right Pain - part of body: Knee     Time: 1132-1200 PT Time Calculation (min) (ACUTE ONLY): 28 min  Charges:  $Gait Training: 8-22 mins $Therapeutic Activity: 8-22 mins                    G Codes:         Weston Anna, MPT Pager: [phone]

## 2017-07-30 NOTE — Discharge Instructions (Signed)
° °Dr. Frank Aluisio °Total Joint Specialist °[facility] °[address] °[place], [place] [id number] °[phone] ° °TOTAL KNEE REPLACEMENT POSTOPERATIVE DIRECTIONS ° °Knee Rehabilitation, Guidelines Following Surgery  °Results after knee surgery are often greatly improved when you follow the exercise, range of motion and muscle strengthening exercises prescribed by your doctor. Safety measures are also important to protect the knee from further injury. Any time any of these exercises cause you to have increased pain or swelling in your knee joint, decrease the amount until you are comfortable again and slowly increase them. If you have problems or questions, call your caregiver or physical therapist for advice.  ° °HOME CARE INSTRUCTIONS  °Remove items at home which could result in a fall. This includes throw rugs or furniture in walking pathways.  °· ICE to the affected knee every three hours for 30 minutes at a time and then as needed for pain and swelling.  Continue to use ice on the knee for pain and swelling from surgery. You may notice swelling that will progress down to the foot and ankle.  This is normal after surgery.  Elevate the leg when you are not up walking on it.   °· Continue to use the breathing machine which will help keep your temperature down.  It is common for your temperature to cycle up and down following surgery, especially at night when you are not up moving around and exerting yourself.  The breathing machine keeps your lungs expanded and your temperature down. °· Do not place pillow under knee, focus on keeping the knee straight while resting ° °DIET °You may resume your previous home diet once your are discharged from the hospital. ° °DRESSING / WOUND CARE / SHOWERING °You may shower 3 days after surgery, but keep the wounds dry during showering.  You may use an occlusive plastic wrap (Press'n Seal for example), NO SOAKING/SUBMERGING IN THE BATHTUB.  If the bandage gets  wet, change with a clean dry gauze.  If the incision gets wet, pat the wound dry with a clean towel. °You may start showering once you are discharged home but do not submerge the incision under water. Just pat the incision dry and apply a dry gauze dressing on daily. °Change the surgical dressing daily and reapply a dry dressing each time. ° °ACTIVITY °Walk with your walker as instructed. °Use walker as long as suggested by your caregivers. °Avoid periods of inactivity such as sitting longer than an hour when not asleep. This helps prevent blood clots.  °You may resume a sexual relationship in one month or when given the OK by your doctor.  °You may return to work once you are cleared by your doctor.  °Do not drive a car for 6 weeks or until released by you surgeon.  °Do not drive while taking narcotics. ° °WEIGHT BEARING °Weight bearing as tolerated with assist device (walker, cane, etc) as directed, use it as long as suggested by your surgeon or therapist, typically at least 4-6 weeks. ° °POSTOPERATIVE CONSTIPATION PROTOCOL °Constipation - defined medically as fewer than three stools per week and severe constipation as less than one stool per week. ° °One of the most common issues patients have following surgery is constipation.  Even if you have a regular bowel pattern at home, your normal regimen is likely to be disrupted due to multiple reasons following surgery.  Combination of anesthesia, postoperative narcotics, change in appetite and fluid intake all can affect your bowels.    In order to avoid complications following surgery, here are some recommendations in order to help you during your recovery period. ° °Colace (docusate) - Pick up an over-the-counter form of Colace or another stool softener and take twice a day as long as you are requiring postoperative pain medications.  Take with a full glass of water daily.  If you experience loose stools or diarrhea, hold the colace until you stool forms back up.  If  your symptoms do not get better within 1 week or if they get worse, check with your doctor. ° °Dulcolax (bisacodyl) - Pick up over-the-counter and take as directed by the product packaging as needed to assist with the movement of your bowels.  Take with a full glass of water.  Use this product as needed if not relieved by Colace only.  ° °MiraLax (polyethylene glycol) - Pick up over-the-counter to have on hand.  MiraLax is a solution that will increase the amount of water in your bowels to assist with bowel movements.  Take as directed and can mix with a glass of water, juice, soda, coffee, or tea.  Take if you go more than two days without a movement. °Do not use MiraLax more than once per day. Call your doctor if you are still constipated or irregular after using this medication for 7 days in a row. ° °If you continue to have problems with postoperative constipation, please contact the office for further assistance and recommendations.  If you experience "the worst abdominal pain ever" or develop nausea or vomiting, please contact the office immediatly for further recommendations for treatment. ° °ITCHING ° If you experience itching with your medications, try taking only a single pain pill, or even half a pain pill at a time.  You can also use Benadryl over the counter for itching or also to help with sleep.  ° °TED HOSE STOCKINGS °Wear the elastic stockings on both legs for three weeks following surgery during the day but you may remove then at night for sleeping. ° °MEDICATIONS °See your medication summary on the “After Visit Summary” that the nursing staff will review with you prior to discharge.  You may have some home medications which will be placed on hold until you complete the course of blood thinner medication.  It is important for you to complete the blood thinner medication as prescribed by your surgeon.  Continue your approved medications as instructed at time of discharge. ° °PRECAUTIONS °If you  experience chest pain or shortness of breath - call 911 immediately for transfer to the hospital emergency department.  °If you develop a fever greater that 101 F, purulent drainage from wound, increased redness or drainage from wound, foul odor from the wound/dressing, or calf pain - CONTACT YOUR SURGEON.   °                                                °FOLLOW-UP APPOINTMENTS °Make sure you keep all of your appointments after your operation with your surgeon and caregivers. You should call the office at the above phone number and make an appointment for approximately two weeks after the date of your surgery or on the date instructed by your surgeon outlined in the "After Visit Summary". ° ° °RANGE OF MOTION AND STRENGTHENING EXERCISES  °Rehabilitation of the knee is important following a knee injury or   an operation. After just a few days of immobilization, the muscles of the thigh which control the knee become weakened and shrink (atrophy). Knee exercises are designed to build up the tone and strength of the thigh muscles and to improve knee motion. Often times heat used for twenty to thirty minutes before working out will loosen up your tissues and help with improving the range of motion but do not use heat for the first two weeks following surgery. These exercises can be done on a training (exercise) mat, on the floor, on a table or on a bed. Use what ever works the best and is most comfortable for you Knee exercises include:  °Leg Lifts - While your knee is still immobilized in a splint or cast, you can do straight leg raises. Lift the leg to 60 degrees, hold for 3 sec, and slowly lower the leg. Repeat 10-20 times 2-3 times daily. Perform this exercise against resistance later as your knee gets better.  °Quad and Hamstring Sets - Tighten up the muscle on the front of the thigh (Quad) and hold for 5-10 sec. Repeat this 10-20 times hourly. Hamstring sets are done by pushing the foot backward against an object  and holding for 5-10 sec. Repeat as with quad sets.  °· Leg Slides: Lying on your back, slowly slide your foot toward your buttocks, bending your knee up off the floor (only go as far as is comfortable). Then slowly slide your foot back down until your leg is flat on the floor again. °· Angel Wings: Lying on your back spread your legs to the side as far apart as you can without causing discomfort.  °A rehabilitation program following serious knee injuries can speed recovery and prevent re-injury in the future due to weakened muscles. Contact your doctor or a physical therapist for more information on knee rehabilitation.  ° °IF YOU ARE TRANSFERRED TO A SKILLED REHAB FACILITY °If the patient is transferred to a skilled rehab facility following release from the hospital, a list of the current medications will be sent to the facility for the patient to continue.  When discharged from the skilled rehab facility, please have the facility set up the patient's Home Health Physical Therapy prior to being released. Also, the skilled facility will be responsible for providing the patient with their medications at time of release from the facility to include their pain medication, the muscle relaxants, and their blood thinner medication. If the patient is still at the rehab facility at time of the two week follow up appointment, the skilled rehab facility will also need to assist the patient in arranging follow up appointment in our office and any transportation needs. ° °MAKE SURE YOU:  °Understand these instructions.  °Get help right away if you are not doing well or get worse.  ° ° °Pick up stool softner and laxative for home use following surgery while on pain medications. °Do not submerge incision under water. °Please use good hand washing techniques while changing dressing each day. °May shower starting three days after surgery. °Please use a clean towel to pat the incision dry following showers. °Continue to use ice for  pain and swelling after surgery. °Do not use any lotions or creams on the incision until instructed by your surgeon. ° °Take Xarelto for two and a half more weeks following discharge from the hospital, then discontinue Xarelto. °Once the patient has completed the blood thinner regimen, then take a Baby 81 mg Aspirin daily for three   more weeks. ° ° ° °Information on my medicine - XARELTO® (Rivaroxaban) ° °Why was Xarelto® prescribed for you? °Xarelto® was prescribed for you to reduce the risk of blood clots forming after orthopedic surgery. The medical term for these abnormal blood clots is venous thromboembolism (VTE). ° °What do you need to know about xarelto® ? °Take your Xarelto® ONCE DAILY at the same time every day. °You may take it either with or without food. ° °If you have difficulty swallowing the tablet whole, you may crush it and mix in applesauce just prior to taking your dose. ° °Take Xarelto® exactly as prescribed by your doctor and DO NOT stop taking Xarelto® without talking to the doctor who prescribed the medication.  Stopping without other VTE prevention medication to take the place of Xarelto® may increase your risk of developing a clot. ° °After discharge, you should have regular check-up appointments with your healthcare provider that is prescribing your Xarelto®.   ° °What do you do if you miss a dose? °If you miss a dose, take it as soon as you remember on the same day then continue your regularly scheduled once daily regimen the next day. Do not take two doses of Xarelto® on the same day.  ° °Important Safety Information °A possible side effect of Xarelto® is bleeding. You should call your healthcare provider right away if you experience any of the following: °? Bleeding from an injury or your nose that does not stop. °? Unusual colored urine (red or dark brown) or unusual colored stools (red or black). °? Unusual bruising for unknown reasons. °? A serious fall or if you hit your head (even  if there is no bleeding). ° °Some medicines may interact with Xarelto® and might increase your risk of bleeding while on Xarelto®. To help avoid this, consult your healthcare provider or pharmacist prior to using any new prescription or non-prescription medications, including herbals, vitamins, non-steroidal anti-inflammatory drugs (NSAIDs) and supplements. ° °This website has more information on Xarelto®: www.xarelto.com. ° ° ° °

## 2017-07-30 NOTE — Progress Notes (Signed)
   Subjective: 1 Day Post-Op Procedure(s) (LRB): RIGHT TOTAL KNEE ARTHROPLASTY (Right) Patient reports pain as mild and moderate.   Patient seen in rounds for Dr. Wynelle Link. Patient is well, but has had some minor complaints of pain in the knee, requiring pain medications We will resume therapy today.  Plan is to go Home after hospital stay.  Objective: Vital signs in last 24 hours: Temp:  [97.7 F (36.5 C)-98.5 F (36.9 C)] 97.8 F (36.6 C) (01/29 0901) Pulse Rate:  [66-77] 68 (01/29 1037) Resp:  [14-18] 16 (01/29 0901) BP: (93-127)/(48-77) 127/63 (01/29 1037) SpO2:  [96 %-100 %] 96 % (01/29 0901)  Intake/Output from previous day:  Intake/Output Summary (Last 24 hours) at 07/30/2017 1311 Last data filed at 07/30/2017 1155 Gross per 24 hour  Intake 2502.5 ml  Output 1695 ml  Net 807.5 ml    Intake/Output this shift: Total I/O In: 240 [P.O.:240] Out: 550 [Urine:550]  Labs: Recent Labs    07/30/17 0537  HGB 9.9*   Recent Labs    07/30/17 0537  WBC 8.7  RBC 3.24*  HCT 29.9*  PLT 181   Recent Labs    07/30/17 0537  NA 138  K 3.7  CL 108  CO2 24  BUN 16  CREATININE 0.81  GLUCOSE 128*  CALCIUM 8.0*   No results for input(s): LABPT, INR in the last 72 hours.  EXAM General - Patient is Alert, Appropriate and Oriented Extremity - Neurovascular intact Sensation intact distally Intact pulses distally Dorsiflexion/Plantar flexion intact Dressing - dressing C/D/I Motor Function - intact, moving foot and toes well on exam.  Hemovac pulled without difficulty.  Past Medical History:  Diagnosis Date  . Arthritis    back   . Cataracts, bilateral   . Chronic back pain    scoliosis/degenerative disc  . Complication of anesthesia    slow to wake up and combative in April 09/in Dec 09 she did much better  . DVT (deep venous thrombosis) ([place])    mid 80's  . Headache    occasionally  . History of bronchitis 5+yrs ago  . History of MRSA infection 2009  .  Osteopenia   . Urinary urgency   . Weakness    numbness and tingling in both legs    Assessment/Plan: 1 Day Post-Op Procedure(s) (LRB): RIGHT TOTAL KNEE ARTHROPLASTY (Right) Principal Problem:   OA (osteoarthritis) of knee  Estimated body mass index is 30.36 kg/m as calculated from the following:   Height as of this encounter: 5\' 2"  (1.575 m).   Weight as of this encounter: 75.3 kg (166 lb). Up with therapy  Probably home tomorrow - Straight to outpatient therapy  DVT Prophylaxis - Xarelto Weight-Bearing as tolerated to right leg D/C O2 and Pulse OX and try on Room Air  Arlee Muslim, PA-C Orthopaedic Surgery 07/30/2017, 1:11 PM

## 2017-07-30 NOTE — Progress Notes (Signed)
Physical Therapy Treatment Patient Details Name: Sandra [name] MRN: [id number] DOB: [birth year] Today's Date: 07/30/2017    History of Present Illness Pt s/p R TKR and with hx of chronic back pain and extensive back surgery.      PT Comments    Progressing slowly with mobility. Pt continues to c/o 8/10 pain with activity. Some encouragement required for participation this session. Pt did agree to performing a few ROM exercises. Pt can be particular and insistent on performing tasks in her preferred manner. Will continue to progress activity as tolerated. Per pt and chart, plan is for outpatient PT to begin on Friday. Will need to practice stair negotiation prior to d/c.     Follow Up Recommendations  DC plan and follow up therapy as arranged by surgeon     Equipment Recommendations  None recommended by PT    Recommendations for Other Services OT consult     Precautions / Restrictions Precautions Precautions: Fall;Knee Required Braces or Orthoses: Knee Immobilizer - Right Knee Immobilizer - Right: Discontinue once straight leg raise with < 10 degree lag Restrictions Weight Bearing Restrictions: No Other Position/Activity Restrictions: WBAT    Mobility  Bed Mobility Overal bed mobility: Needs Assistance Bed Mobility: Supine to Sit;Sit to Supine     Supine to sit: Min assist;HOB elevated Sit to supine: HOB elevated;Min guard   General bed mobility comments: Assist for R LE off bed. Pt requested to get LE onto bed without assistance. Increased time.   Transfers Overall transfer level: Needs assistance Equipment used: Rolling walker (2 wheeled) Transfers: Sit to/from Stand Sit to Stand: Min guard         General transfer comment: Highly elevated bed (pt stated her bed is high and she uses a step to get on /off). VCs safety, hand/LE placement.   Ambulation/Gait Ambulation/Gait assistance: Min guard Ambulation Distance (Feet): 25 Feet Assistive device: Rolling walker  (2 wheeled) Gait Pattern/deviations: Step-to pattern     General Gait Details: close guard for safety. Decreased distance this session due to increased pain.    Stairs            Wheelchair Mobility    Modified Rankin (Stroke Patients Only)       Balance Overall balance assessment: Needs assistance         Standing balance support: Bilateral upper extremity supported Standing balance-Leahy Scale: Poor                              Cognition Arousal/Alertness: Awake/alert Behavior During Therapy: WFL for tasks assessed/performed Overall Cognitive Status: Within Functional Limits for tasks assessed                                        Exercises Total Joint Exercises  Quad Sets: AROM;Both;5 reps;Supine Heel Slides: AAROM;Right;5 reps;Supine Hip ABduction/ADduction: AAROM;Right;5 reps;Supine Straight Leg Raises: AAROM;Right;5 reps;Supine Goniometric ROM: ~10-65 degrees    General Comments        Pertinent Vitals/Pain Pain Assessment: 0-10 Pain Score: 8  Pain Location: R knee/thigh Pain Descriptors / Indicators: Aching;Sore Pain Intervention(s): Monitored during session    Home Living                      Prior Function            PT Goals (current goals  can now be found in the care plan section) Progress towards PT goals: Progressing toward goals    Frequency    7X/week      PT Plan Current plan remains appropriate    Co-evaluation              AM-PAC PT "6 Clicks" Daily Activity  Outcome Measure  Difficulty turning over in bed (including adjusting bedclothes, sheets and blankets)?: A Little Difficulty moving from lying on back to sitting on the side of the bed? : Unable Difficulty sitting down on and standing up from a chair with arms (e.g., wheelchair, bedside commode, etc,.)?: A Little Help needed moving to and from a bed to chair (including a wheelchair)?: A Little Help needed walking in  hospital room?: A Little Help needed climbing 3-5 steps with a railing? : A Lot 6 Click Score: 15    End of Session Equipment Utilized During Treatment: Gait belt;Right knee immobilizer Activity Tolerance: Patient limited by pain Patient left: in bed;with call bell/phone within reach;with family/visitor present   PT Visit Diagnosis: Difficulty in walking, not elsewhere classified (R26.2);Pain Pain - Right/Left: Right Pain - part of body: Knee     Time: 1342-1420 PT Time Calculation (min) (ACUTE ONLY): 38 min  Charges:  $Gait Training: 8-22 mins $Therapeutic Exercise: 8-22 mins $Therapeutic Activity: 8-22 mins                    G Codes:          Weston Anna, MPT Pager: [phone]

## 2017-07-31 LAB — CBC
HEMATOCRIT: 28.5 % — AB (ref 36.0–46.0)
Hemoglobin: 9.4 g/dL — ABNORMAL LOW (ref 12.0–15.0)
MCH: 30.2 pg (ref 26.0–34.0)
MCHC: 33 g/dL (ref 30.0–36.0)
MCV: 91.6 fL (ref 78.0–100.0)
Platelets: 175 10*3/uL (ref 150–400)
RBC: 3.11 MIL/uL — AB (ref 3.87–5.11)
RDW: 13.4 % (ref 11.5–15.5)
WBC: 9 10*3/uL (ref 4.0–10.5)

## 2017-07-31 LAB — BASIC METABOLIC PANEL
ANION GAP: 6 (ref 5–15)
BUN: 13 mg/dL (ref 6–20)
CO2: 27 mmol/L (ref 22–32)
Calcium: 8.3 mg/dL — ABNORMAL LOW (ref 8.9–10.3)
Chloride: 108 mmol/L (ref 101–111)
Creatinine, Ser: 0.75 mg/dL (ref 0.44–1.00)
GFR calc Af Amer: >60 mL/min (ref >60)
GFR calc non Af Amer: >60 mL/min (ref >60)
GLUCOSE: 98 mg/dL (ref 65–99)
POTASSIUM: 3.4 mmol/L — AB (ref 3.5–5.1)
Sodium: 141 mmol/L (ref 135–145)

## 2017-07-31 NOTE — Progress Notes (Signed)
Physical Therapy Treatment Patient Details Name: Sandra [name] MRN: [id number] DOB: Apr 05, 1941 Today's Date: 07/31/2017    History of Present Illness Pt s/p R TKR and with hx of chronic back pain and extensive back surgery.      PT Comments    Progressing with mobility. Reviewed/practiced exercises, gait training, and stair training. Issued HEP for pt to perform 2x/day until she begins OP PT. All education completed. Okay to d/c from PT standpoint-made RN aware.    Follow Up Recommendations  DC plan and follow up therapy as arranged by surgeon     Equipment Recommendations  None recommended by PT    Recommendations for Other Services       Precautions / Restrictions Precautions Precautions: Fall;Knee Precaution Comments: Educated pt on "no pillow behind knee" precaution Required Braces or Orthoses: Knee Immobilizer - Right Knee Immobilizer - Right: Discontinue once straight leg raise with < 10 degree lag Restrictions Weight Bearing Restrictions: No Other Position/Activity Restrictions: WBAT    Mobility  Bed Mobility Overal bed mobility: Needs Assistance Bed Mobility: Supine to Sit;Sit to Supine     Supine to sit: Min guard;HOB elevated Sit to supine: Min guard;HOB elevated   General bed mobility comments: close guard for safety. Increased time.   Transfers Overall transfer level: Needs assistance Equipment used: Rolling walker (2 wheeled) Transfers: Sit to/from Stand Sit to Stand: Min guard         General transfer comment: Highly elevated bed (pt stated her bed is high and she uses a step to get on /off). VCs safety, hand/LE placement.   Ambulation/Gait Ambulation/Gait assistance: Min guard Ambulation Distance (Feet): 50 Feet Assistive device: Rolling walker (2 wheeled) Gait Pattern/deviations: Step-to pattern     General Gait Details: close guard for safety.    Stairs Stairs: Yes  Min guard Assist Stair Management: Two rails;Step to pattern Number  of Stairs: 2 General stair comments: up and over portable steps. VCs safety, technique, sequence. close guard for safety. sister present to observe  Wheelchair Mobility    Modified Rankin (Stroke Patients Only)       Balance                                            Cognition Arousal/Alertness: Awake/alert Behavior During Therapy: WFL for tasks assessed/performed Overall Cognitive Status: Within Functional Limits for tasks assessed                                 General Comments: at beginning of session, pt seemed a little confused:  improved over course of tx.  Reinforced knee precautions      Exercises Total Joint Exercises Ankle Circles/Pumps: AROM;Both;10 reps;Supine Quad Sets: AROM;Both;Supine;10 reps Heel Slides: AAROM;Right;Supine;10 reps Hip ABduction/ADduction: AAROM;Right;Supine;10 reps Straight Leg Raises: AAROM;Right;Supine;10 reps Goniometric ROM: ~10-65 degrees    General Comments        Pertinent Vitals/Pain Pain Assessment: 0-10 Pain Score: 5  Pain Location: R knee/thigh Pain Descriptors / Indicators: Aching;Sore Pain Intervention(s): Monitored during session;Repositioned;Ice applied    Home Living                      Prior Function            PT Goals (current goals can now be found in the care plan  section) Progress towards PT goals: Progressing toward goals    Frequency    7X/week      PT Plan Current plan remains appropriate    Co-evaluation              AM-PAC PT "6 Clicks" Daily Activity  Outcome Measure  Difficulty turning over in bed (including adjusting bedclothes, sheets and blankets)?: A Little Difficulty moving from lying on back to sitting on the side of the bed? : A Little Difficulty sitting down on and standing up from a chair with arms (e.g., wheelchair, bedside commode, etc,.)?: A Little Help needed moving to and from a bed to chair (including a wheelchair)?: A  Little Help needed walking in hospital room?: A Little Help needed climbing 3-5 steps with a railing? : A Little 6 Click Score: 18    End of Session Equipment Utilized During Treatment: Gait belt;Right knee immobilizer Activity Tolerance: Patient tolerated treatment well Patient left: in bed;with call bell/phone within reach;with family/visitor present   PT Visit Diagnosis: Difficulty in walking, not elsewhere classified (R26.2);Pain Pain - Right/Left: Right Pain - part of body: Knee     Time: 1030-1056 PT Time Calculation (min) (ACUTE ONLY): 26 min  Charges:  $Gait Training: 8-22 mins $Therapeutic Exercise: 8-22 mins                    G Codes:         Weston Anna, MPT Pager: [phone]

## 2017-07-31 NOTE — Progress Notes (Signed)
Occupational Therapy Treatment Patient Details Name: Sandra [name] MRN: [id number] DOB: 09-06-1940 Today's Date: 07/31/2017    History of present illness Pt s/p R TKR and with hx of chronic back pain and extensive back surgery.     OT comments  All education was completed this session  Follow Up Recommendations  Supervision/Assistance - 24 hour    Equipment Recommendations  None recommended by OT    Recommendations for Other Services      Precautions / Restrictions Precautions Precautions: Fall;Knee Required Braces or Orthoses: Knee Immobilizer - Right Knee Immobilizer - Right: Discontinue once straight leg raise with < 10 degree lag Restrictions Weight Bearing Restrictions: No Other Position/Activity Restrictions: WBAT       Mobility Bed Mobility         Supine to sit: Min assist     General bed mobility comments: Assist for R LE off bed. Pt requested to get LE onto bed without assistance. Increased time.   Transfers                      Balance                                           ADL either performed or assessed with clinical judgement   ADL       Grooming: Oral care;Wash/dry hands;Supervision/safety;Standing                   Toilet Transfer: Min guard;Ambulation;BSC;RW   Toileting- Water quality scientist and Hygiene: Min guard;Sit to/from stand   Tub/ Shower Transfer: Min guard;Walk-in shower;Ambulation;Shower seat     General ADL Comments: practiced with sock aide; pt has had difficulty donning R sock in the past.  Performed the above activities also.  Pt demonstrates mostly safe technique:  one cue given during turn for safety     Vision       Perception     Praxis      Cognition Arousal/Alertness: Awake/alert Behavior During Therapy: WFL for tasks assessed/performed Overall Cognitive Status: Within Functional Limits for tasks assessed                                 General  Comments: at beginning of session, pt seemed a little confused:  improved over course of tx.  Reinforced knee precautions        Exercises     Shoulder Instructions       General Comments      Pertinent Vitals/ Pain       Pain Score: 5  Pain Location: R knee/thigh Pain Descriptors / Indicators: Aching;Sore Pain Intervention(s): Limited activity within patient's tolerance;Monitored during session;Premedicated before session;Repositioned;Ice applied  Home Living                                          Prior Functioning/Environment              Frequency           Progress Toward Goals  OT Goals(current goals can now be found in the care plan section)  Progress towards OT goals: Goals met/education completed, patient discharged from [place]  Co-evaluation                 AM-PAC PT "6 Clicks" Daily Activity     Outcome Measure   Help from another person eating meals?: None Help from another person taking care of personal grooming?: A Little Help from another person toileting, which includes using toliet, bedpan, or urinal?: A Little Help from another person bathing (including washing, rinsing, drying)?: A Lot Help from another person to put on and taking off regular upper body clothing?: A Little Help from another person to put on and taking off regular lower body clothing?: A Lot 6 Click Score: 17    End of Session CPM Right Knee CPM Right Knee: Off  OT Visit Diagnosis: Pain Pain - Right/Left: Right Pain - part of body: Knee   Activity Tolerance Patient tolerated treatment well   Patient Left in chair;with call bell/phone within reach   Nurse Communication          Time: 4859-2763 OT Time Calculation (min): 30 min  Charges: OT General Charges $OT Visit: 1 Visit OT Treatments $Self Care/Home Management : 23-37 mins  Lesle Chris, OTR/L [phone] 07/31/2017   [place] 07/31/2017, 8:28  AM

## 2017-07-31 NOTE — Progress Notes (Signed)
   Subjective: 2 Days Post-Op Procedure(s) (LRB): RIGHT TOTAL KNEE ARTHROPLASTY (Right) Patient reports pain as mild.   Patient seen in rounds with Dr. Wynelle Link. Patient is well, and has had no acute complaints or problems Patient is ready to go home  Objective: Vital signs in last 24 hours: Temp:  [97.8 F (36.6 C)-99.1 F (37.3 C)] 99.1 F (37.3 C) (01/30 0618) Pulse Rate:  [68-90] 73 (01/30 0618) Resp:  [16-18] 18 (01/30 0618) BP: (93-160)/(45-80) 130/66 (01/30 0618) SpO2:  [95 %-97 %] 97 % (01/30 0618)  Intake/Output from previous day:  Intake/Output Summary (Last 24 hours) at 07/31/2017 0713 Last data filed at 07/30/2017 1810 Gross per 24 hour  Intake 480 ml  Output 550 ml  Net -70 ml    Intake/Output this shift: No intake/output data recorded.  Labs: Recent Labs    07/30/17 0537 07/31/17 0540  HGB 9.9* 9.4*   Recent Labs    07/30/17 0537 07/31/17 0540  WBC 8.7 9.0  RBC 3.24* 3.11*  HCT 29.9* 28.5*  PLT 181 175   Recent Labs    07/30/17 0537  NA 138  K 3.7  CL 108  CO2 24  BUN 16  CREATININE 0.81  GLUCOSE 128*  CALCIUM 8.0*   No results for input(s): LABPT, INR in the last 72 hours.  EXAM: General - Patient is Alert and Appropriate Extremity - Neurovascular intact Sensation intact distally Intact pulses distally Dorsiflexion/Plantar flexion intact Incision - clean, dry Motor Function - intact, moving foot and toes well on exam.   Assessment/Plan: 2 Days Post-Op Procedure(s) (LRB): RIGHT TOTAL KNEE ARTHROPLASTY (Right) Procedure(s) (LRB): RIGHT TOTAL KNEE ARTHROPLASTY (Right) Past Medical History:  Diagnosis Date  . Arthritis    back   . Cataracts, bilateral   . Chronic back pain    scoliosis/degenerative disc  . Complication of anesthesia    slow to wake up and combative in April 09/in Dec 09 she did much better  . DVT (deep venous thrombosis) ([place])    mid 80's  . Headache    occasionally  . History of bronchitis 5+yrs ago  .  History of MRSA infection 2009  . Osteopenia   . Urinary urgency   . Weakness    numbness and tingling in both legs   Principal Problem:   OA (osteoarthritis) of knee  Estimated body mass index is 30.36 kg/m as calculated from the following:   Height as of this encounter: 5\' 2"  (1.575 m).   Weight as of this encounter: 75.3 kg (166 lb). Up with therapy Diet - Regular diet Follow up - in 2 weeks Activity - WBAT Disposition - Home Condition Upon Discharge - Stable D/C Meds - See DC Summary DVT Prophylaxis - Xarelto  Arlee Muslim, PA-C Orthopaedic Surgery 07/31/2017, 7:13 AM

## 2017-08-02 DIAGNOSIS — M25561 Pain in right knee: Secondary | ICD-10-CM | POA: Diagnosis not present

## 2017-08-05 DIAGNOSIS — M25561 Pain in right knee: Secondary | ICD-10-CM | POA: Diagnosis not present

## 2017-08-07 DIAGNOSIS — M25561 Pain in right knee: Secondary | ICD-10-CM | POA: Diagnosis not present

## 2017-08-09 DIAGNOSIS — M25561 Pain in right knee: Secondary | ICD-10-CM | POA: Diagnosis not present

## 2017-08-12 DIAGNOSIS — M542 Cervicalgia: Secondary | ICD-10-CM | POA: Diagnosis not present

## 2017-08-12 DIAGNOSIS — M25561 Pain in right knee: Secondary | ICD-10-CM | POA: Diagnosis not present

## 2017-08-12 DIAGNOSIS — M47816 Spondylosis without myelopathy or radiculopathy, lumbar region: Secondary | ICD-10-CM | POA: Diagnosis not present

## 2017-08-13 ENCOUNTER — Inpatient Hospital Stay (HOSPITAL_COMMUNITY)
Admission: EM | Admit: 2017-08-13 | Discharge: 2017-08-20 | DRG: 470 | Disposition: A | Discharge status: Home Health | Payer: Medicare Other | Attending: Internal Medicine | Admitting: Internal Medicine

## 2017-08-13 ENCOUNTER — Other Ambulatory Visit: Payer: Self-pay

## 2017-08-13 ENCOUNTER — Emergency Department (HOSPITAL_COMMUNITY): Payer: Medicare Other

## 2017-08-13 ENCOUNTER — Encounter (HOSPITAL_COMMUNITY): Payer: Self-pay | Admitting: Emergency Medicine

## 2017-08-13 DIAGNOSIS — Z881 Allergy status to other antibiotic agents status: Secondary | ICD-10-CM | POA: Diagnosis not present

## 2017-08-13 DIAGNOSIS — K2970 Gastritis, unspecified, without bleeding: Secondary | ICD-10-CM | POA: Diagnosis present

## 2017-08-13 DIAGNOSIS — M1710 Unilateral primary osteoarthritis, unspecified knee: Secondary | ICD-10-CM | POA: Diagnosis not present

## 2017-08-13 DIAGNOSIS — K253 Acute gastric ulcer without hemorrhage or perforation: Secondary | ICD-10-CM | POA: Diagnosis not present

## 2017-08-13 DIAGNOSIS — Z87891 Personal history of nicotine dependence: Secondary | ICD-10-CM

## 2017-08-13 DIAGNOSIS — Z888 Allergy status to other drugs, medicaments and biological substances status: Secondary | ICD-10-CM | POA: Diagnosis not present

## 2017-08-13 DIAGNOSIS — M810 Age-related osteoporosis without current pathological fracture: Secondary | ICD-10-CM | POA: Diagnosis present

## 2017-08-13 DIAGNOSIS — W010XXA Fall on same level from slipping, tripping and stumbling without subsequent striking against object, initial encounter: Secondary | ICD-10-CM | POA: Diagnosis present

## 2017-08-13 DIAGNOSIS — Y92009 Unspecified place in unspecified non-institutional (private) residence as the place of occurrence of the external cause: Secondary | ICD-10-CM

## 2017-08-13 DIAGNOSIS — S72011A Unspecified intracapsular fracture of right femur, initial encounter for closed fracture: Principal | ICD-10-CM | POA: Diagnosis present

## 2017-08-13 DIAGNOSIS — S72001A Fracture of unspecified part of neck of right femur, initial encounter for closed fracture: Secondary | ICD-10-CM | POA: Diagnosis present

## 2017-08-13 DIAGNOSIS — Z6830 Body mass index (BMI) 30.0-30.9, adult: Secondary | ICD-10-CM

## 2017-08-13 DIAGNOSIS — K921 Melena: Secondary | ICD-10-CM | POA: Diagnosis not present

## 2017-08-13 DIAGNOSIS — R197 Diarrhea, unspecified: Secondary | ICD-10-CM | POA: Diagnosis present

## 2017-08-13 DIAGNOSIS — I824Y9 Acute embolism and thrombosis of unspecified deep veins of unspecified proximal lower extremity: Secondary | ICD-10-CM

## 2017-08-13 DIAGNOSIS — S72009A Fracture of unspecified part of neck of unspecified femur, initial encounter for closed fracture: Secondary | ICD-10-CM | POA: Diagnosis present

## 2017-08-13 DIAGNOSIS — S72009D Fracture of unspecified part of neck of unspecified femur, subsequent encounter for closed fracture with routine healing: Secondary | ICD-10-CM | POA: Diagnosis not present

## 2017-08-13 DIAGNOSIS — K3189 Other diseases of stomach and duodenum: Secondary | ICD-10-CM | POA: Diagnosis not present

## 2017-08-13 DIAGNOSIS — W19XXXD Unspecified fall, subsequent encounter: Secondary | ICD-10-CM

## 2017-08-13 DIAGNOSIS — I361 Nonrheumatic tricuspid (valve) insufficiency: Secondary | ICD-10-CM | POA: Diagnosis not present

## 2017-08-13 DIAGNOSIS — Z7901 Long term (current) use of anticoagulants: Secondary | ICD-10-CM | POA: Diagnosis not present

## 2017-08-13 DIAGNOSIS — G8929 Other chronic pain: Secondary | ICD-10-CM | POA: Diagnosis present

## 2017-08-13 DIAGNOSIS — K5900 Constipation, unspecified: Secondary | ICD-10-CM | POA: Diagnosis not present

## 2017-08-13 DIAGNOSIS — Z981 Arthrodesis status: Secondary | ICD-10-CM

## 2017-08-13 DIAGNOSIS — R0602 Shortness of breath: Secondary | ICD-10-CM | POA: Diagnosis not present

## 2017-08-13 DIAGNOSIS — Z8614 Personal history of Methicillin resistant Staphylococcus aureus infection: Secondary | ICD-10-CM

## 2017-08-13 DIAGNOSIS — Z7983 Long term (current) use of bisphosphonates: Secondary | ICD-10-CM

## 2017-08-13 DIAGNOSIS — T148XXA Other injury of unspecified body region, initial encounter: Secondary | ICD-10-CM | POA: Diagnosis not present

## 2017-08-13 DIAGNOSIS — I82409 Acute embolism and thrombosis of unspecified deep veins of unspecified lower extremity: Secondary | ICD-10-CM | POA: Diagnosis present

## 2017-08-13 DIAGNOSIS — M791 Myalgia, unspecified site: Secondary | ICD-10-CM | POA: Diagnosis not present

## 2017-08-13 DIAGNOSIS — D649 Anemia, unspecified: Secondary | ICD-10-CM | POA: Diagnosis present

## 2017-08-13 DIAGNOSIS — Z96641 Presence of right artificial hip joint: Secondary | ICD-10-CM | POA: Diagnosis not present

## 2017-08-13 DIAGNOSIS — Y9301 Activity, walking, marching and hiking: Secondary | ICD-10-CM

## 2017-08-13 DIAGNOSIS — Z882 Allergy status to sulfonamides status: Secondary | ICD-10-CM | POA: Diagnosis not present

## 2017-08-13 DIAGNOSIS — K2950 Unspecified chronic gastritis without bleeding: Secondary | ICD-10-CM | POA: Diagnosis not present

## 2017-08-13 DIAGNOSIS — Z8249 Family history of ischemic heart disease and other diseases of the circulatory system: Secondary | ICD-10-CM

## 2017-08-13 DIAGNOSIS — Z01818 Encounter for other preprocedural examination: Secondary | ICD-10-CM

## 2017-08-13 DIAGNOSIS — G9589 Other specified diseases of spinal cord: Secondary | ICD-10-CM | POA: Diagnosis not present

## 2017-08-13 DIAGNOSIS — M25551 Pain in right hip: Secondary | ICD-10-CM | POA: Diagnosis not present

## 2017-08-13 DIAGNOSIS — Z90710 Acquired absence of both cervix and uterus: Secondary | ICD-10-CM

## 2017-08-13 DIAGNOSIS — S72041A Displaced fracture of base of neck of right femur, initial encounter for closed fracture: Secondary | ICD-10-CM | POA: Diagnosis not present

## 2017-08-13 DIAGNOSIS — E669 Obesity, unspecified: Secondary | ICD-10-CM | POA: Diagnosis present

## 2017-08-13 DIAGNOSIS — K259 Gastric ulcer, unspecified as acute or chronic, without hemorrhage or perforation: Secondary | ICD-10-CM | POA: Diagnosis present

## 2017-08-13 DIAGNOSIS — Z885 Allergy status to narcotic agent status: Secondary | ICD-10-CM | POA: Diagnosis not present

## 2017-08-13 DIAGNOSIS — Z86718 Personal history of other venous thrombosis and embolism: Secondary | ICD-10-CM

## 2017-08-13 DIAGNOSIS — D62 Acute posthemorrhagic anemia: Secondary | ICD-10-CM | POA: Diagnosis not present

## 2017-08-13 DIAGNOSIS — Z471 Aftercare following joint replacement surgery: Secondary | ICD-10-CM | POA: Diagnosis not present

## 2017-08-13 DIAGNOSIS — M545 Low back pain: Secondary | ICD-10-CM | POA: Diagnosis not present

## 2017-08-13 DIAGNOSIS — M419 Scoliosis, unspecified: Secondary | ICD-10-CM | POA: Diagnosis present

## 2017-08-13 DIAGNOSIS — W19XXXA Unspecified fall, initial encounter: Secondary | ICD-10-CM | POA: Diagnosis present

## 2017-08-13 DIAGNOSIS — Z96651 Presence of right artificial knee joint: Secondary | ICD-10-CM | POA: Diagnosis present

## 2017-08-13 LAB — CBC WITH DIFFERENTIAL/PLATELET
Basophils Absolute: 0 10*3/uL (ref 0.0–0.1)
Basophils Relative: 0 %
EOS ABS: 0.1 10*3/uL (ref 0.0–0.7)
EOS PCT: 1 %
HCT: 26.8 % — ABNORMAL LOW (ref 36.0–46.0)
Hemoglobin: 8.9 g/dL — ABNORMAL LOW (ref 12.0–15.0)
LYMPHS ABS: 1.1 10*3/uL (ref 0.7–4.0)
Lymphocytes Relative: 16 %
MCH: 30.9 pg (ref 26.0–34.0)
MCHC: 33.2 g/dL (ref 30.0–36.0)
MCV: 93.1 fL (ref 78.0–100.0)
MONO ABS: 0.4 10*3/uL (ref 0.1–1.0)
MONOS PCT: 6 %
Neutro Abs: 5.5 10*3/uL (ref 1.7–7.7)
Neutrophils Relative %: 77 %
PLATELETS: 255 10*3/uL (ref 150–400)
RBC: 2.88 MIL/uL — ABNORMAL LOW (ref 3.87–5.11)
RDW: 13.6 % (ref 11.5–15.5)
WBC: 7.2 10*3/uL (ref 4.0–10.5)

## 2017-08-13 LAB — BASIC METABOLIC PANEL
Anion gap: 9 (ref 5–15)
BUN: 35 mg/dL — AB (ref 6–20)
CHLORIDE: 106 mmol/L (ref 101–111)
CO2: 27 mmol/L (ref 22–32)
CREATININE: 0.99 mg/dL (ref 0.44–1.00)
Calcium: 8.9 mg/dL (ref 8.9–10.3)
GFR calc Af Amer: >60 mL/min (ref >60)
GFR calc non Af Amer: 55 mL/min — ABNORMAL LOW (ref >60)
Glucose, Bld: 122 mg/dL — ABNORMAL HIGH (ref 65–99)
Potassium: 4.5 mmol/L (ref 3.5–5.1)
Sodium: 142 mmol/L (ref 135–145)

## 2017-08-13 LAB — OCCULT BLOOD X 1 CARD TO LAB, STOOL: Fecal Occult Bld: POSITIVE — AB

## 2017-08-13 LAB — RETICULOCYTES
RBC.: 2.46 MIL/uL — ABNORMAL LOW (ref 3.87–5.11)
RETIC COUNT ABSOLUTE: 81.2 10*3/uL (ref 19.0–186.0)
Retic Ct Pct: 3.3 % — ABNORMAL HIGH (ref 0.4–3.1)

## 2017-08-13 LAB — PROTIME-INR
INR: 1.45
PROTHROMBIN TIME: 17.5 s — AB (ref 11.4–15.2)

## 2017-08-13 MED ORDER — OXYCODONE HCL 5 MG PO TABS
15.0000 mg | ORAL_TABLET | ORAL | Status: DC | PRN
  Administered 2017-08-13 – 2017-08-15 (×8): 20 mg via ORAL
  Filled 2017-08-13 (×8): qty 4

## 2017-08-13 MED ORDER — METHOCARBAMOL 1000 MG/10ML IJ SOLN
1000.0000 mg | Freq: Four times a day (QID) | INTRAVENOUS | Status: DC | PRN
  Filled 2017-08-13: qty 10

## 2017-08-13 MED ORDER — GABAPENTIN 400 MG PO CAPS
800.0000 mg | ORAL_CAPSULE | Freq: Every day | ORAL | Status: DC
  Administered 2017-08-13 – 2017-08-19 (×7): 800 mg via ORAL
  Filled 2017-08-13 (×8): qty 2

## 2017-08-13 MED ORDER — FENTANYL CITRATE (PF) 100 MCG/2ML IJ SOLN
50.0000 ug | Freq: Once | INTRAMUSCULAR | Status: AC
  Administered 2017-08-13: 50 ug via INTRAVENOUS
  Filled 2017-08-13: qty 2

## 2017-08-13 MED ORDER — GABAPENTIN 400 MG PO CAPS
400.0000 mg | ORAL_CAPSULE | Freq: Every day | ORAL | Status: DC
  Administered 2017-08-14 – 2017-08-20 (×7): 400 mg via ORAL
  Filled 2017-08-13 (×7): qty 1

## 2017-08-13 MED ORDER — SODIUM CHLORIDE 0.9 % IV BOLUS (SEPSIS)
500.0000 mL | Freq: Once | INTRAVENOUS | Status: AC
  Administered 2017-08-13: 23:00:00 500 mL via INTRAVENOUS

## 2017-08-13 MED ORDER — HYDROMORPHONE HCL 1 MG/ML IJ SOLN
1.0000 mg | INTRAMUSCULAR | Status: DC | PRN
  Administered 2017-08-13 – 2017-08-15 (×11): 1 mg via INTRAVENOUS
  Filled 2017-08-13 (×12): qty 1

## 2017-08-13 MED ORDER — FENTANYL CITRATE (PF) 100 MCG/2ML IJ SOLN
50.0000 ug | INTRAMUSCULAR | Status: DC | PRN
  Administered 2017-08-13: 50 ug via INTRAVENOUS
  Filled 2017-08-13: qty 2

## 2017-08-13 MED ORDER — METHOCARBAMOL 500 MG PO TABS: 500.0000 mg | ORAL_TABLET | Freq: Four times a day (QID) | ORAL | Status: DC | PRN

## 2017-08-13 MED ORDER — LACTATED RINGERS IV SOLN
INTRAVENOUS | Status: DC
  Administered 2017-08-13 – 2017-08-14 (×2): via INTRAVENOUS

## 2017-08-13 MED ORDER — OXYCODONE HCL 5 MG PO TABS: 10.0000 mg | ORAL_TABLET | ORAL | Status: DC | PRN

## 2017-08-13 MED ORDER — CHLORHEXIDINE GLUCONATE 4 % EX LIQD: 60.0000 mL | Freq: Once | CUTANEOUS | Status: DC

## 2017-08-13 MED ORDER — METHOCARBAMOL 500 MG PO TABS
750.0000 mg | ORAL_TABLET | Freq: Four times a day (QID) | ORAL | Status: DC | PRN
  Administered 2017-08-15: 750 mg via ORAL
  Filled 2017-08-13: qty 2

## 2017-08-13 NOTE — H&P (Signed)
History and Physical    Sandra [name] IHK:[id number] DOB: 04-24-1941 DOA: 08/13/2017  PCP: Jonathon Jordan, MD  Patient coming from: Home  Chief Complaint: Tripped and fell  HPI: Sandra [name] is a 77 y.o. female with medical history significant of DVT in the 80s, chronic back pain who is overall very healthy and still working full-time is status post a right knee replacement on July 21, 2017 still on Xarelto for DVT prophylaxis postoperatively comes in today after tripping and injuring her right leg.  She did not have any loss of consciousness.  There was no syncope.  She feels like her leg gave out which caused her to fall.  She unfortunately has suffered a right femoral neck fracture which is displaced.  Orthopedic surgery has been consulted who plan on taking her to the OR tomorrow.  She is complaining of quite a bit of pain right now.  I have asked the nurse to give her some fentanyl.  Patient is being referred for admission for surgical repair of her right femur.  Review of Systems: As per HPI otherwise 10 point review of systems negative.   Past Medical History:  Diagnosis Date  . Arthritis    back   . Cataracts, bilateral   . Chronic back pain    scoliosis/degenerative disc  . Complication of anesthesia    slow to wake up and combative in April 09/in Dec 09 she did much better  . DVT (deep venous thrombosis) ([place])    mid 80's  . Headache    occasionally  . History of bronchitis 5+yrs ago  . History of MRSA infection 2009  . Osteopenia   . Urinary urgency   . Weakness    numbness and tingling in both legs    Past Surgical History:  Procedure Laterality Date  . ABDOMINAL HYSTERECTOMY  2009  . APPENDECTOMY  1966  . BACK SURGERY  2009/2010  . bladder tacked   2009  . COLONOSCOPY    . TOTAL KNEE ARTHROPLASTY Right 07/29/2017   Procedure: RIGHT TOTAL KNEE ARTHROPLASTY;  Surgeon: Gaynelle Arabian, MD;  Location: [facility];  Service: Orthopedics;  Laterality: Right;  . TUBAL  LIGATION  1986     reports that she quit smoking about 8 months ago. She has a 50.00 pack-year smoking history. she has never used smokeless tobacco. She reports that she drinks alcohol. She reports that she does not use drugs.  Allergies  Allergen Reactions  . Clarithromycin Diarrhea  . Codeine Other (See Comments)    Headache  . Resinol [Dermatological Products, Misc.] Other (See Comments)    Skin was like "raw meat"..Skin peeled off.  . Sulfa Antibiotics Rash    No family history on file.  Prior to Admission medications   Medication Sig Start Date End Date Taking? Authorizing Provider  alendronate (FOSAMAX) 70 MG tablet Take 70 mg by mouth once a week. Take with a full glass of water on an empty stomach.   Yes [provider]  gabapentin (NEURONTIN) 400 MG capsule Take 400-800 mg by mouth See admin instructions. Take 400 mg in the morning and 800 mg at night   Yes [provider]  methocarbamol (ROBAXIN) 500 MG tablet Take 1 tablet (500 mg total) by mouth every 6 (six) hours as needed for muscle spasms. 07/30/17  Yes Perkins, Alexzandrew L, PA-C  Oxycodone HCl 10 MG TABS Take 1-2 tablets (10-20 mg total) by mouth every 4 (four) hours as needed. For pain 07/30/17  Yes Perkins, Alexzandrew L, PA-C  Polyvinyl Alcohol-Povidone (REFRESH OP) Apply 1 drop to eye 4 (four) times daily as needed (dry eyes).   Yes [provider]  potassium gluconate (HM POTASSIUM) 595 MG TABS tablet Take 595 mg by mouth at bedtime.    Yes [provider]  rivaroxaban (XARELTO) 10 MG TABS tablet Take 1 tablet (10 mg total) by mouth daily with breakfast. Take Xarelto for two and a half more weeks following discharge from the hospital, then discontinue Xarelto. Once the patient has completed the blood thinner regimen, then take a Baby 81 mg Aspirin daily for three more weeks. 07/31/17  Yes Perkins, Alexzandrew L, PA-C  traMADol (ULTRAM) 50 MG tablet Take 1-2 tablets (50-100 mg total)  by mouth every 6 (six) hours as needed (mild pain). Patient not taking: Reported on 08/13/2017 07/30/17   Joelene Millin, PA-C    Physical Exam: Vitals:   08/13/17 1445 08/13/17 1805  BP: 135/74 124/64  Pulse: 75 100  Resp: 12 15  Temp: 98.2 F (36.8 C)   TempSrc: Oral   SpO2: 100% 98%      Constitutional: NAD, calm, uncomfortable due to pain Vitals:   08/13/17 1445 08/13/17 1805  BP: 135/74 124/64  Pulse: 75 100  Resp: 12 15  Temp: 98.2 F (36.8 C)   TempSrc: Oral   SpO2: 100% 98%   Eyes: PERRL, lids and conjunctivae normal ENMT: Mucous membranes are moist. Posterior pharynx clear of any exudate or lesions.Normal dentition.  Neck: normal, supple, no masses, no thyromegaly Respiratory: clear to auscultation bilaterally, no wheezing, no crackles. Normal respiratory effort. No accessory muscle use.  Cardiovascular: Regular rate and rhythm, no murmurs / rubs / gallops. No extremity edema. 2+ pedal pulses. No carotid bruits.  Abdomen: no tenderness, no masses palpated. No hepatosplenomegaly. Bowel sounds positive.  Musculoskeletal: no clubbing / cyanosis. No joint deformity upper and lower extremities except right femur. Good ROM, no contractures. Normal muscle tone.  Skin: no rashes, lesions, ulcers. No induration Neurologic: CN 2-12 grossly intact. Sensation intact, DTR normal. Strength 5/5 in all 4.  Psychiatric: Normal judgment and insight. Alert and oriented x 3. Normal mood.    Labs on Admission: I have personally reviewed following labs and imaging studies  CBC: Recent Labs  Lab 08/13/17 1556  WBC 7.2  NEUTROABS 5.5  HGB 8.9*  HCT 26.8*  MCV 93.1  PLT 387   Basic Metabolic Panel: Recent Labs  Lab 08/13/17 1556  NA 142  K 4.5  CL 106  CO2 27  GLUCOSE 122*  BUN 35*  CREATININE 0.99  CALCIUM 8.9   GFR: CrCl cannot be calculated (Unknown ideal weight.). Liver Function Tests: No results for input(s): AST, ALT, ALKPHOS, BILITOT, PROT, ALBUMIN  in the last 168 hours. No results for input(s): LIPASE, AMYLASE in the last 168 hours. No results for input(s): AMMONIA in the last 168 hours. Coagulation Profile: No results for input(s): INR, PROTIME in the last 168 hours. Cardiac Enzymes: No results for input(s): CKTOTAL, CKMB, CKMBINDEX, TROPONINI in the last 168 hours. BNP (last 3 results) No results for input(s): PROBNP in the last 8760 hours. HbA1C: No results for input(s): HGBA1C in the last 72 hours. CBG: No results for input(s): GLUCAP in the last 168 hours. Lipid Profile: No results for input(s): CHOL, HDL, LDLCALC, TRIG, CHOLHDL, LDLDIRECT in the last 72 hours. Thyroid Function Tests: No results for input(s): TSH, T4TOTAL, FREET4, T3FREE, THYROIDAB in the last 72 hours. Anemia Panel: No  results for input(s): VITAMINB12, FOLATE, FERRITIN, TIBC, IRON, RETICCTPCT in the last 72 hours. Urine analysis:    Component Value Date/Time   COLORURINE YELLOW 02/25/2015 1906   APPEARANCEUR CLOUDY (A) 02/25/2015 1906   LABSPEC 1.016 02/25/2015 1906   PHURINE 6.0 02/25/2015 1906   GLUCOSEU NEGATIVE 02/25/2015 1906   HGBUR TRACE (A) 02/25/2015 1906   BILIRUBINUR NEGATIVE 02/25/2015 1906   KETONESUR NEGATIVE 02/25/2015 1906   PROTEINUR NEGATIVE 02/25/2015 1906   UROBILINOGEN 0.2 02/25/2015 1906   NITRITE NEGATIVE 02/25/2015 1906   LEUKOCYTESUR SMALL (A) 02/25/2015 1906   Sepsis Labs: !!!!!!!!!!!!!!!!!!!!!!!!!!!!!!!!!!!!!!!!!!!! @LABRCNTIP (procalcitonin:4,lacticidven:4) )No results found for this or any previous visit (from the past 240 hour(s)).   Radiological Exams on Admission: Dg Lumbar Spine 2-3 Views  Result Date: 08/13/2017 CLINICAL DATA:  Fall, low back pain. EXAM: LUMBAR SPINE - 2-3 VIEW COMPARISON:  11/16/2016 FINDINGS: The patient has posterolateral rod and pedicle screw fixation involving all levels from T10 through the sacrum and also extending into the iliac bones. Similar to prior. I do not see a discrete hardware  failure. However, there is some lucency around the T10 pedicle screws which is chronic and could indicate loosening or infection. Lateral view attempts are problematic and the cortex of the L4 and L5 vertebra is essentially not visible on the lateral projections (presumably due to cross-table lateral requirement and difficulty with motion), but appears grossly intact on the frontal view. IMPRESSION: 1. No obvious malalignment; reduced sensitivity for fracture at L4 and L5 due to severe blurring of the vertebral outlines causing poor definition of the cortex on the lateral projections. This pleura is probably due to the combination of cross-table lateral projection and motion artifact and persist despite repeat projection. 2. Chronic lucency around the T10 pedicle screws, probably from loosening or infection. Electronically Signed   By: Van Clines M.D.   On: 08/13/2017 17:39   Dg Knee Complete 4 Views Right  Result Date: 08/13/2017 CLINICAL DATA:  Fall, acute hip fracture, pain in the right lower extremity. EXAM: RIGHT KNEE - COMPLETE 4+ VIEW COMPARISON:  12/30/2015 FINDINGS: Interval total knee arthroplasty on the right. Suboptimal positioning due to the hip fracture causing difficulty in alignment. A true lateral projection is not obtained, and the only projections obtainable are somewhat oblique. Indeterminate for knee effusion. Somewhat low position of the patella although this has been present since 2016 and may simply represent normal variation rather than patella baja. I do not see an obvious cortical discontinuity. IMPRESSION: 1. No appreciable fracture. 2. Suboptimal positioning because of the hip fracture. No true lateral could be obtained. Indeterminate for knee effusion. 3. Low but stable position of the patella. 4. Total knee arthroplasty noted. Electronically Signed   By: Van Clines M.D.   On: 08/13/2017 17:36   Dg Hip Unilat  With Pelvis 2-3 Views Right  Result Date:  08/13/2017 CLINICAL DATA:  Fall landing on the right side.  Right hip pain. EXAM: DG HIP (WITH OR WITHOUT PELVIS) 2-3V RIGHT COMPARISON:  CT pelvis 02/25/2015 FINDINGS: Displaced fracture the right femoral neck. Posterolateral rod and pedicle screw fixation in the lower lumbar spine and also involving the sacrum and iliac bones. If No other fractures are identified. IMPRESSION: 1. Displaced fracture of the right femoral neck. Electronically Signed   By: Van Clines M.D.   On: 08/13/2017 17:34    EKG: Independently reviewed.  Normal sinus rhythm no acute changes Old chart reviewed Case discussed with EDP  Assessment/Plan 77 year old female with mechanical  fall suffering a right displaced femoral neck fracture Principal Problem:   Hip fracture (HCC) right femoral neck fracture-provide fentanyl as needed for pain.  Placed on muscle relaxers.  Continue her oral oxycodone that she is been taking at home for her recent surgery.  Hold Xarelto.  N.p.o. after midnight for surgical repair tomorrow.  Active Problems:   DVT (deep venous thrombosis) ([place]) history of-noted postoperative DVT prophylaxis per orthopedic team    Fall-mechanical noted    Normocytic anemia-hemoglobin around 8.9 her baseline appears to be around this.  Normocytic.  No evidence of bleeding.  Will check anemia panel and Hemoccult stool.  Monitor postoperative CBC.    DVT prophylaxis: SCDs Code Status: Full Family Communication: Husband Disposition Plan: Per day team Consults called: Orthopedic surgery Admission status: Admission   Lamount Bankson A MD [facility]  If 7PM-7AM, please contact night-coverage [email or web address] Password [email or web address]  08/13/2017, 7:39 PM

## 2017-08-13 NOTE — Consult Note (Signed)
Sandra [name]    Chief Complaint: right hip pain  HPI: The patient is a 77 y.o. female who is 2 weeks out from R TKA was geeting ready to leave home to make first post op appt and her right hip had severe pain with pop giving way with them inability to ambulate. Pt with PMH significant for osteoporosis and chronic pain. Still on Xarelto for post op DVT prophylaxis.  Past Medical History:  Diagnosis Date  . Arthritis    back   . Cataracts, bilateral   . Chronic back pain    scoliosis/degenerative disc  . Complication of anesthesia    slow to wake up and combative in April 09/in Dec 09 she did much better  . DVT (deep venous thrombosis) ([place])    mid 80's  . Headache    occasionally  . History of bronchitis 5+yrs ago  . History of MRSA infection 2009  . Osteopenia   . Urinary urgency   . Weakness    numbness and tingling in both legs    Past Surgical History:  Procedure Laterality Date  . ABDOMINAL HYSTERECTOMY  2009  . APPENDECTOMY  1966  . BACK SURGERY  2009/2010  . bladder tacked   2009  . COLONOSCOPY    . TOTAL KNEE ARTHROPLASTY Right 07/29/2017   Procedure: RIGHT TOTAL KNEE ARTHROPLASTY;  Surgeon: Sandra Arabian, MD;  Location: [facility];  Service: Orthopedics;  Laterality: Right;  . TUBAL LIGATION  1986    No family history on file.  Social History:  reports that she quit smoking about 8 months ago. She has a 50.00 pack-year smoking history. she has never used smokeless tobacco. She reports that she drinks alcohol. She reports that she does not use drugs.  Allergies:  Allergies  Allergen Reactions  . Clarithromycin Diarrhea  . Codeine Other (See Comments)    Headache  . Resinol [Dermatological Products, Misc.] Other (See Comments)    Skin was like "raw meat"..Skin peeled off.  . Sulfa Antibiotics Rash    Prior to Admission medications   Medication Sig Start Date End Date Taking? Authorizing Provider  alendronate (FOSAMAX) 70 MG tablet Take 70 mg by mouth once a  week. Take with a full glass of water on an empty stomach.   Yes [provider]  gabapentin (NEURONTIN) 400 MG capsule Take 400-800 mg by mouth See admin instructions. Take 400 mg in the morning and 800 mg at night   Yes [provider]  methocarbamol (ROBAXIN) 500 MG tablet Take 1 tablet (500 mg total) by mouth every 6 (six) hours as needed for muscle spasms. 07/30/17  Yes [name], Sandra L, PA-C  Oxycodone HCl 10 MG TABS Take 1-2 tablets (10-20 mg total) by mouth every 4 (four) hours as needed. For pain 07/30/17  Yes [name], Sandra L, PA-C  Polyvinyl Alcohol-Povidone (REFRESH OP) Apply 1 drop to eye 4 (four) times daily as needed (dry eyes).   Yes [provider]  potassium gluconate (HM POTASSIUM) 595 MG TABS tablet Take 595 mg by mouth at bedtime.    Yes [provider]  rivaroxaban (XARELTO) 10 MG TABS tablet Take 1 tablet (10 mg total) by mouth daily with breakfast. Take Xarelto for two and a half more weeks following discharge from the hospital, then discontinue Xarelto. Once the patient has completed the blood thinner regimen, then take a Baby 81 mg Aspirin daily for three more weeks. 07/31/17  Yes [name], Sandra L, PA-C  traMADol Veatrice Bourbon)  50 MG tablet Take 1-2 tablets (50-100 mg total) by mouth every 6 (six) hours as needed (mild pain). Patient not taking: Reported on 08/13/2017 07/30/17   Sandra [name], Sandra L, PA-C    ROS: as per HPI  Physical Exam:  Currently on bed pan , in severe pain secondary to right hip.  Alert and oriented Moves foot and ankle well   Vitals Temp:  [98.2 F (36.8 C)] 98.2 F (36.8 C) (02/12 1445) Pulse Rate:  [75-100] 100 (02/12 1805) Resp:  [12-15] 15 (02/12 1805) BP: (124-135)/(64-74) 124/64 (02/12 1805) SpO2:  [98 %-100 %] 98 % (02/12 1805)   Xrays show displaced right femoral neck fracture   Assessment/Plan Impression: Right femoral neck fracture Plan of Action: Dr. Wynelle [name] is aware of her  admit and plans to add her on tomorrow for R hip hemiarthroplasty vs THA.  Will hold her Xarelto and put her in bucks traction prn comfort  Prn anaglesics , she has required [name] doses of oxycodone with her knee replacement.  Sandra Wolford PA-C 08/13/2017, 6:32 PM

## 2017-08-13 NOTE — ED Notes (Signed)
Bed: WA17 Expected date:  Expected time:  Means of arrival:  Comments: EMS fall 

## 2017-08-13 NOTE — H&P (View-Only) (Signed)
[place]    Chief Complaint: right hip pain  HPI: The patient is a 77 y.o. female who is 2 weeks out from R TKA was geeting ready to leave home to make first post op appt and her right hip had severe pain with pop giving way with them inability to ambulate. Pt with PMH significant for osteoporosis and chronic pain. Still on Xarelto for post op DVT prophylaxis.  Past Medical History:  Diagnosis Date  . Arthritis    back   . Cataracts, bilateral   . Chronic back pain    scoliosis/degenerative disc  . Complication of anesthesia    slow to wake up and combative in April 09/in Dec 09 she did much better  . DVT (deep venous thrombosis) ([place])    mid 80's  . Headache    occasionally  . History of bronchitis 5+yrs ago  . History of MRSA infection 2009  . Osteopenia   . Urinary urgency   . Weakness    numbness and tingling in both legs    Past Surgical History:  Procedure Laterality Date  . ABDOMINAL HYSTERECTOMY  2009  . APPENDECTOMY  1966  . BACK SURGERY  2009/2010  . bladder tacked   2009  . COLONOSCOPY    . TOTAL KNEE ARTHROPLASTY Right 07/29/2017   Procedure: RIGHT TOTAL KNEE ARTHROPLASTY;  Surgeon: Gaynelle Arabian, MD;  Location: [facility];  Service: Orthopedics;  Laterality: Right;  . TUBAL LIGATION  1986    No family history on file.  Social History:  reports that she quit smoking about 8 months ago. She has a 50.00 pack-year smoking history. she has never used smokeless tobacco. She reports that she drinks alcohol. She reports that she does not use drugs.  Allergies:  Allergies  Allergen Reactions  . Clarithromycin Diarrhea  . Codeine Other (See Comments)    Headache  . Resinol [Dermatological Products, Misc.] Other (See Comments)    Skin was like "raw meat"..Skin peeled off.  . Sulfa Antibiotics Rash    Prior to Admission medications   Medication Sig Start Date End Date Taking? Authorizing Provider  alendronate (FOSAMAX) 70 MG tablet Take 70 mg by mouth once a  week. Take with a full glass of water on an empty stomach.   Yes [provider]  gabapentin (NEURONTIN) 400 MG capsule Take 400-800 mg by mouth See admin instructions. Take 400 mg in the morning and 800 mg at night   Yes [provider]  methocarbamol (ROBAXIN) 500 MG tablet Take 1 tablet (500 mg total) by mouth every 6 (six) hours as needed for muscle spasms. 07/30/17  Yes Perkins, Alexzandrew L, PA-C  Oxycodone HCl 10 MG TABS Take 1-2 tablets (10-20 mg total) by mouth every 4 (four) hours as needed. For pain 07/30/17  Yes Perkins, Alexzandrew L, PA-C  Polyvinyl Alcohol-Povidone (REFRESH OP) Apply 1 drop to eye 4 (four) times daily as needed (dry eyes).   Yes [provider]  potassium gluconate (HM POTASSIUM) 595 MG TABS tablet Take 595 mg by mouth at bedtime.    Yes [provider]  rivaroxaban (XARELTO) 10 MG TABS tablet Take 1 tablet (10 mg total) by mouth daily with breakfast. Take Xarelto for two and a half more weeks following discharge from the hospital, then discontinue Xarelto. Once the patient has completed the blood thinner regimen, then take a Baby 81 mg Aspirin daily for three more weeks. 07/31/17  Yes Perkins, Alexzandrew L, PA-C  traMADol Veatrice Bourbon)  50 MG tablet Take 1-2 tablets (50-100 mg total) by mouth every 6 (six) hours as needed (mild pain). Patient not taking: Reported on 08/13/2017 07/30/17   Dara Lords, Alexzandrew L, PA-C    ROS: as per HPI  Physical Exam:  Currently on bed pan , in severe pain secondary to right hip.  Alert and oriented Moves foot and ankle well   Vitals Temp:  [98.2 F (36.8 C)] 98.2 F (36.8 C) (02/12 1445) Pulse Rate:  [75-100] 100 (02/12 1805) Resp:  [12-15] 15 (02/12 1805) BP: (124-135)/(64-74) 124/64 (02/12 1805) SpO2:  [98 %-100 %] 98 % (02/12 1805)   Xrays show displaced right femoral neck fracture   Assessment/Plan Impression: Right femoral neck fracture Plan of Action: Dr. Wynelle Link is aware of her  admit and plans to add her on tomorrow for R hip hemiarthroplasty vs THA.  Will hold her Xarelto and put her in bucks traction prn comfort  Prn anaglesics , she has required high doses of oxycodone with her knee replacement.  Chauncey Bruno PA-C 08/13/2017, 6:32 PM

## 2017-08-13 NOTE — ED Notes (Signed)
ED TO INPATIENT HANDOFF REPORT  Name/Age/Gender Sandra [name] 77 y.o. female  Code Status    Code Status Orders  (From admission, onward)        Start     Ordered   08/13/17 1941  Full code  Continuous     08/13/17 1943    Code Status History    Date Active Date Inactive Code Status Order ID Comments User Context   07/29/2017 11:05 07/31/2017 15:22 Full Code [id number]  Gaynelle Arabian, MD Inpatient   10/07/2014 15:41 10/12/2014 14:20 Full Code [id number]  Erline Levine, MD Inpatient      Home/SNF/Other   Chief Complaint Fall; Hip Injury  Level of Care/Admitting Diagnosis ED Disposition    ED Disposition Condition [place]: [facility] [[id number]]  Level of Care: Med-Surg [16]  Diagnosis: Hip fracture [facility]) [[id number]]  Admitting Physician: Phillips Grout [[id number]]  Attending Physician: Derrill Kay A [[id number]]  Estimated length of stay: 3 - 4 days  Certification:: I certify this patient will need inpatient services for at least 2 midnights  PT Class (Do Not Modify): Inpatient [101]  PT Acc Code (Do Not Modify): Private [1]       Medical History Past Medical History:  Diagnosis Date  . Arthritis    back   . Cataracts, bilateral   . Chronic back pain    scoliosis/degenerative disc  . Complication of anesthesia    slow to wake up and combative in April 09/in Dec 09 she did much better  . DVT (deep venous thrombosis) ([place])    mid 80's  . Headache    occasionally  . History of bronchitis 5+yrs ago  . History of MRSA infection 2009  . Osteopenia   . Urinary urgency   . Weakness    numbness and tingling in both legs    Allergies Allergies  Allergen Reactions  . Clarithromycin Diarrhea  . Codeine Other (See Comments)    Headache  . Resinol [Dermatological Products, Misc.] Other (See Comments)    Skin was like "raw meat"..Skin peeled off.  . Sulfa Antibiotics Rash    IV Location/Drains/Wounds Patient Lines/Drains/Airways  Status   Active Line/Drains/Airways    Name:   Placement date:   Placement time:   Site:   Days:   Peripheral IV 08/13/17 Left Antecubital   08/13/17    1547    Antecubital   less than 1   Incision (Closed) 10/07/14 Back   10/07/14    1342     1041   Incision (Closed) 07/29/17 Knee Right   07/29/17    0809     15          Labs/Imaging Results for orders placed or performed during the hospital encounter of 08/13/17 (from the past 48 hour(s))  Basic metabolic panel     Status: Abnormal   Collection Time: 08/13/17  3:56 PM  Result Value Ref Range   Sodium 142 135 - 145 mmol/L   Potassium 4.5 3.5 - 5.1 mmol/L   Chloride 106 101 - 111 mmol/L   CO2 27 22 - 32 mmol/L   Glucose, Bld 122 (H) 65 - 99 mg/dL   BUN 35 (H) 6 - 20 mg/dL   Creatinine, Ser 0.99 0.44 - 1.00 mg/dL   Calcium 8.9 8.9 - 10.3 mg/dL   GFR calc non Af Amer 55 (L) >60 mL/min   GFR calc Af Amer >60 >60 mL/min    Comment: (  NOTE) The eGFR has been calculated using the CKD EPI equation. This calculation has not been validated in all clinical situations. eGFR's persistently <60 mL/min signify possible Chronic Kidney Disease.    Anion gap 9 5 - 15    Comment: Performed at [facility], [place] [address] [place], [place] [address]  CBC with Differential     Status: Abnormal   Collection Time: 08/13/17  3:56 PM  Result Value Ref Range   WBC 7.2 4.0 - 10.5 K/uL   RBC 2.88 (L) 3.87 - 5.11 MIL/uL   Hemoglobin 8.9 (L) 12.0 - 15.0 g/dL   HCT 26.8 (L) 36.0 - 46.0 %   MCV 93.1 78.0 - 100.0 fL   MCH 30.9 26.0 - 34.0 pg   MCHC 33.2 30.0 - 36.0 g/dL   RDW 13.6 11.5 - 15.5 %   Platelets 255 150 - 400 K/uL   Neutrophils Relative % 77 %   Neutro Abs 5.5 1.7 - 7.7 K/uL   Lymphocytes Relative 16 %   Lymphs Abs 1.1 0.7 - 4.0 K/uL   Monocytes Relative 6 %   Monocytes Absolute 0.4 0.1 - 1.0 K/uL   Eosinophils Relative 1 %   Eosinophils Absolute 0.1 0.0 - 0.7 K/uL   Basophils Relative 0 %   Basophils Absolute 0.0  0.0 - 0.1 K/uL    Comment: Performed at [facility], [place] [address] [place], [place] [address]   Dg Lumbar Spine 2-3 Views  Result Date: 08/13/2017 CLINICAL DATA:  Fall, low back pain. EXAM: LUMBAR SPINE - 2-3 VIEW COMPARISON:  11/16/2016 FINDINGS: The patient has posterolateral rod and pedicle screw fixation involving all levels from T10 through the sacrum and also extending into the iliac bones. Similar to prior. I do not see a discrete hardware failure. However, there is some lucency around the T10 pedicle screws which is chronic and could indicate loosening or infection. Lateral view attempts are problematic and the cortex of the L4 and L5 vertebra is essentially not visible on the lateral projections (presumably due to cross-table lateral requirement and difficulty with motion), but appears grossly intact on the frontal view. IMPRESSION: 1. No obvious malalignment; reduced sensitivity for fracture at L4 and L5 due to severe blurring of the vertebral outlines causing poor definition of the cortex on the lateral projections. This pleura is probably due to the combination of cross-table lateral projection and motion artifact and persist despite repeat projection. 2. Chronic lucency around the T10 pedicle screws, probably from loosening or infection. Electronically Signed   By: Van Clines M.D.   On: 08/13/2017 17:39   Dg Knee Complete 4 Views Right  Result Date: 08/13/2017 CLINICAL DATA:  Fall, acute hip fracture, pain in the right lower extremity. EXAM: RIGHT KNEE - COMPLETE 4+ VIEW COMPARISON:  12/30/2015 FINDINGS: Interval total knee arthroplasty on the right. Suboptimal positioning due to the hip fracture causing difficulty in alignment. A true lateral projection is not obtained, and the only projections obtainable are somewhat oblique. Indeterminate for knee effusion. Somewhat low position of the patella although this has been present since 2016 and may simply represent  normal variation rather than patella baja. I do not see an obvious cortical discontinuity. IMPRESSION: 1. No appreciable fracture. 2. Suboptimal positioning because of the hip fracture. No true lateral could be obtained. Indeterminate for knee effusion. 3. Low but stable position of the patella. 4. Total knee arthroplasty noted. Electronically Signed   By: Cindra Eves.D.  On: 08/13/2017 17:36   Dg Hip Unilat  With Pelvis 2-3 Views Right  Result Date: 08/13/2017 CLINICAL DATA:  Fall landing on the right side.  Right hip pain. EXAM: DG HIP (WITH OR WITHOUT PELVIS) 2-3V RIGHT COMPARISON:  CT pelvis 02/25/2015 FINDINGS: Displaced fracture the right femoral neck. Posterolateral rod and pedicle screw fixation in the lower lumbar spine and also involving the sacrum and iliac bones. If No other fractures are identified. IMPRESSION: 1. Displaced fracture of the right femoral neck. Electronically Signed   By: Van Clines M.D.   On: 08/13/2017 17:34    Pending Labs Unresulted Labs (From admission, onward)   Start     Ordered   08/14/17 0500  CBC  Tomorrow morning,   R     08/13/17 1943   08/14/17 3794  Basic metabolic panel  Tomorrow morning,   R     08/13/17 1943   08/13/17 1944  Vitamin B12  (Anemia Panel (PNL))  Once,   R     08/13/17 1943   08/13/17 1944  Folate  (Anemia Panel (PNL))  Once,   R     08/13/17 1943   08/13/17 1944  Iron and TIBC  (Anemia Panel (PNL))  Once,   R     08/13/17 1943   08/13/17 1944  Ferritin  (Anemia Panel (PNL))  Once,   R     08/13/17 1943   08/13/17 1944  Reticulocytes  (Anemia Panel (PNL))  Once,   R     08/13/17 1943   08/13/17 1943  Type and screen [place]  Once,   R    Comments:  [place]    08/13/17 1943   08/13/17 1942  Protime-INR  Once,   R     08/13/17 1943   Unscheduled  Occult blood card to lab, stool  As needed,   R     08/13/17 1943      Vitals/Pain Today's Vitals   08/13/17 1445  08/13/17 1804 08/13/17 1805 08/13/17 2012  BP: 135/74  124/64 (!) 99/55  Pulse: 75  100 97  Resp: '12  15 17  ' Temp: 98.2 F (36.8 C)     TempSrc: Oral     SpO2: 100%  98% 98%  PainSc:  6       Isolation Precautions No active isolations  Medications Medications  gabapentin (NEURONTIN) capsule 400 mg (not administered)  methocarbamol (ROBAXIN) tablet 500 mg (not administered)  oxyCODONE (Oxy IR/ROXICODONE) immediate release tablet 10-20 mg (not administered)  fentaNYL (SUBLIMAZE) injection 50 mcg (not administered)  gabapentin (NEURONTIN) capsule 800 mg (not administered)  fentaNYL (SUBLIMAZE) injection 50 mcg (50 mcg Intravenous Given 08/13/17 1608)  fentaNYL (SUBLIMAZE) injection 50 mcg (50 mcg Intravenous Given 08/13/17 1942)    Mobility Bedrest

## 2017-08-13 NOTE — Progress Notes (Signed)
Notified K. Schorr, floor cvg provider about pts positive occult stool test. Awaiting any new orders.

## 2017-08-13 NOTE — ED Notes (Signed)
Placed Pt on purewick catheter. Pt unable to ambulate, and has urinary frequency, with hip and knee pain. Performed pericare prior to placement.

## 2017-08-13 NOTE — ED Provider Notes (Signed)
[facility] DEPT Provider Note   CSN: [id number] Arrival date & time: 08/13/17  1436     History   Chief Complaint Chief Complaint  Patient presents with  . Fall    HPI Sandra [name] is a 77 y.o. female.  The history is provided by the patient and medical records. No language interpreter was used.  Fall    Sandra [name] is a 77 y.o. female  with a PMH of right total knee arthroplasty on 1/2 8 by Dr. Wynelle Link who presents to the Emergency Department complaining of right hip pain which began today.  Patient states that she was walking on her way to her orthopedic appointment when she lost her balance and fell on her right hip.  Complaining of right hip pain which is worse with certain movements.  She does not have any knee pain and does not believe she had the knee, but very worried that she has messed something up given her recent procedure.  Denies any numbness or tingling.  No medications taken prior to arrival for symptoms.  Pain is worse with movement of the hip.  She did not hit her head or lose consciousness.  No neck pain.  She is on Xarelto.  Past Medical History:  Diagnosis Date  . Arthritis    back   . Cataracts, bilateral   . Chronic back pain    scoliosis/degenerative disc  . Complication of anesthesia    slow to wake up and combative in April 09/in Dec 09 she did much better  . DVT (deep venous thrombosis) ([place])    mid 80's  . Headache    occasionally  . History of bronchitis 5+yrs ago  . History of MRSA infection 2009  . Osteopenia   . Urinary urgency   . Weakness    numbness and tingling in both legs    Patient Active Problem List   Diagnosis Date Noted  . OA (osteoarthritis) of knee 07/29/2017  . Other secondary scoliosis, thoracolumbar region 10/07/2014    Past Surgical History:  Procedure Laterality Date  . ABDOMINAL HYSTERECTOMY  2009  . APPENDECTOMY  1966  . BACK SURGERY  2009/2010  . bladder tacked   2009  .  COLONOSCOPY    . TOTAL KNEE ARTHROPLASTY Right 07/29/2017   Procedure: RIGHT TOTAL KNEE ARTHROPLASTY;  Surgeon: Gaynelle Arabian, MD;  Location: [facility];  Service: Orthopedics;  Laterality: Right;  . TUBAL LIGATION  1986    OB History    No data available       Home Medications    Prior to Admission medications   Medication Sig Start Date End Date Taking? Authorizing Provider  alendronate (FOSAMAX) 70 MG tablet Take 70 mg by mouth once a week. Take with a full glass of water on an empty stomach.   Yes [provider]  gabapentin (NEURONTIN) 400 MG capsule Take 400-800 mg by mouth See admin instructions. Take 400 mg in the morning and 800 mg at night   Yes [provider]  methocarbamol (ROBAXIN) 500 MG tablet Take 1 tablet (500 mg total) by mouth every 6 (six) hours as needed for muscle spasms. 07/30/17  Yes Perkins, Alexzandrew L, PA-C  Oxycodone HCl 10 MG TABS Take 1-2 tablets (10-20 mg total) by mouth every 4 (four) hours as needed. For pain 07/30/17  Yes Perkins, Alexzandrew L, PA-C  Polyvinyl Alcohol-Povidone (REFRESH OP) Apply 1 drop to eye 4 (four) times daily as needed (dry eyes).  Yes [provider]  potassium gluconate (HM POTASSIUM) 595 MG TABS tablet Take 595 mg by mouth at bedtime.    Yes [provider]  rivaroxaban (XARELTO) 10 MG TABS tablet Take 1 tablet (10 mg total) by mouth daily with breakfast. Take Xarelto for two and a half more weeks following discharge from the hospital, then discontinue Xarelto. Once the patient has completed the blood thinner regimen, then take a Baby 81 mg Aspirin daily for three more weeks. 07/31/17  Yes Perkins, Alexzandrew L, PA-C  traMADol (ULTRAM) 50 MG tablet Take 1-2 tablets (50-100 mg total) by mouth every 6 (six) hours as needed (mild pain). Patient not taking: Reported on 08/13/2017 07/30/17   Joelene Millin, PA-C    Family History No family history on file.  Social History Social History    Tobacco Use  . Smoking status: Former Smoker    Packs/day: 1.00    Years: 50.00    Pack years: 50.00    Last attempt to quit: 11/2016    Years since quitting: 0.7  . Smokeless tobacco: Never Used  Substance Use Topics  . Alcohol use: Yes    Comment: monthly  . Drug use: No     Allergies   Clarithromycin; Codeine; Resinol [dermatological products, misc.]; and Sulfa antibiotics   Review of Systems Review of Systems  Musculoskeletal: Positive for arthralgias and myalgias.  All other systems reviewed and are negative.    Physical Exam Updated Vital Signs BP 124/64   Pulse 100   Temp 98.2 F (36.8 C) (Oral)   Resp 15   SpO2 98%   Physical Exam  Constitutional: She is oriented to person, place, and time. She appears well-developed and well-nourished. No distress.  HENT:  Head: Normocephalic and atraumatic.  Neck:  No midline or paraspinal tenderness.  Full range of motion without pain.  Cardiovascular: Normal rate, regular rhythm and normal heart sounds.  No murmur heard. Pulmonary/Chest: Effort normal and breath sounds normal. No respiratory distress.  Abdominal: Soft. She exhibits no distension. There is no tenderness.  Musculoskeletal:  Tenderness to palpation of right lateral hip and right lumbar paraspinal region with no overlying skin changes. Right leg shortened.  Neurological: She is alert and oriented to person, place, and time.  Alert, oriented, thought content appropriate, able to give a coherent history. Speech is clear and goal oriented, able to follow commands.  Cranial Nerves:  II:  Peripheral visual fields grossly normal, pupils equal, round, reactive to light III, IV, VI: EOM intact bilaterally, ptosis not present V,VII: smile symmetric, eyes kept closed tightly against resistance, facial light touch sensation equal VIII: hearing grossly normal IX, X: symmetric soft palate movement, uvula elevates symmetrically  XI: bilateral shoulder shrug  symmetric and strong XII: midline tongue extension Sensory to light touch normal in all four extremities.  Normal finger-to-nose and rapid alternating movements.  Skin: Skin is warm and dry.  Nursing note and vitals reviewed.   ED Treatments / Results  Labs (all labs ordered are listed, but only abnormal results are displayed) Labs Reviewed  BASIC METABOLIC PANEL - Abnormal; Notable for the following components:      Result Value   Glucose, Bld 122 (*)    BUN 35 (*)    GFR calc non Af Amer 55 (*)    All other components within normal limits  CBC WITH DIFFERENTIAL/PLATELET - Abnormal; Notable for the following components:   RBC 2.88 (*)    Hemoglobin 8.9 (*)  HCT 26.8 (*)    All other components within normal limits    EKG  EKG Interpretation None       Radiology Dg Lumbar Spine 2-3 Views  Result Date: 08/13/2017 CLINICAL DATA:  Fall, low back pain. EXAM: LUMBAR SPINE - 2-3 VIEW COMPARISON:  11/16/2016 FINDINGS: The patient has posterolateral rod and pedicle screw fixation involving all levels from T10 through the sacrum and also extending into the iliac bones. Similar to prior. I do not see a discrete hardware failure. However, there is some lucency around the T10 pedicle screws which is chronic and could indicate loosening or infection. Lateral view attempts are problematic and the cortex of the L4 and L5 vertebra is essentially not visible on the lateral projections (presumably due to cross-table lateral requirement and difficulty with motion), but appears grossly intact on the frontal view. IMPRESSION: 1. No obvious malalignment; reduced sensitivity for fracture at L4 and L5 due to severe blurring of the vertebral outlines causing poor definition of the cortex on the lateral projections. This pleura is probably due to the combination of cross-table lateral projection and motion artifact and persist despite repeat projection. 2. Chronic lucency around the T10 pedicle screws,  probably from loosening or infection. Electronically Signed   By: Van Clines M.D.   On: 08/13/2017 17:39   Dg Knee Complete 4 Views Right  Result Date: 08/13/2017 CLINICAL DATA:  Fall, acute hip fracture, pain in the right lower extremity. EXAM: RIGHT KNEE - COMPLETE 4+ VIEW COMPARISON:  12/30/2015 FINDINGS: Interval total knee arthroplasty on the right. Suboptimal positioning due to the hip fracture causing difficulty in alignment. A true lateral projection is not obtained, and the only projections obtainable are somewhat oblique. Indeterminate for knee effusion. Somewhat low position of the patella although this has been present since 2016 and may simply represent normal variation rather than patella baja. I do not see an obvious cortical discontinuity. IMPRESSION: 1. No appreciable fracture. 2. Suboptimal positioning because of the hip fracture. No true lateral could be obtained. Indeterminate for knee effusion. 3. Low but stable position of the patella. 4. Total knee arthroplasty noted. Electronically Signed   By: Van Clines M.D.   On: 08/13/2017 17:36   Dg Hip Unilat  With Pelvis 2-3 Views Right  Result Date: 08/13/2017 CLINICAL DATA:  Fall landing on the right side.  Right hip pain. EXAM: DG HIP (WITH OR WITHOUT PELVIS) 2-3V RIGHT COMPARISON:  CT pelvis 02/25/2015 FINDINGS: Displaced fracture the right femoral neck. Posterolateral rod and pedicle screw fixation in the lower lumbar spine and also involving the sacrum and iliac bones. If No other fractures are identified. IMPRESSION: 1. Displaced fracture of the right femoral neck. Electronically Signed   By: Van Clines M.D.   On: 08/13/2017 17:34    Procedures Procedures (including critical care time)  Medications Ordered in ED Medications  fentaNYL (SUBLIMAZE) injection 50 mcg (50 mcg Intravenous Given 08/13/17 1608)     Initial Impression / Assessment and Plan / ED Course  I have reviewed the triage vital signs and  the nursing notes.  Pertinent labs & imaging results that were available during my care of the patient were reviewed by me and considered in my medical decision making (see chart for details).    Sandra [name] is a 77 y.o. female who presents to ED for evaluation following mechanical fall complaining of right hip pain.  On exam, patient does have tenderness to the right hip and right lower extremity  is shortened.  Concern for fracture.  X-rays obtained and do show displaced femoral neck fracture.  Orthopedics consulted who will come evaluate patient in the emergency department tonight and recommend medical admission.  Hospitalist consulted who will admit.   Patient seen by and discussed with Dr. Gilford Raid who agrees with treatment plan.    Final Clinical Impressions(s) / ED Diagnoses   Final diagnoses:  Closed fracture of neck of right femur, initial encounter [facility]    ED Discharge Orders    None       Demichael Traum, Ozella Almond, PA-C 08/13/17 Kathaleen Maser    Isla Pence, MD 08/14/17 [phone]

## 2017-08-13 NOTE — ED Triage Notes (Addendum)
Per EMS-tripped and fell over door jam at home-landed on right side-complaining of right hip pain-100 Fentanyl given in route-shortening to right leg

## 2017-08-14 ENCOUNTER — Inpatient Hospital Stay (HOSPITAL_COMMUNITY): Payer: Medicare Other

## 2017-08-14 DIAGNOSIS — S72009D Fracture of unspecified part of neck of unspecified femur, subsequent encounter for closed fracture with routine healing: Secondary | ICD-10-CM

## 2017-08-14 LAB — BASIC METABOLIC PANEL
Anion gap: 9 (ref 5–15)
BUN: 57 mg/dL — AB (ref 6–20)
CALCIUM: 8.5 mg/dL — AB (ref 8.9–10.3)
CO2: 21 mmol/L — AB (ref 22–32)
CREATININE: 1.04 mg/dL — AB (ref 0.44–1.00)
Chloride: 109 mmol/L (ref 101–111)
GFR calc non Af Amer: 52 mL/min — ABNORMAL LOW (ref >60)
GFR, EST AFRICAN AMERICAN: 60 mL/min — AB (ref >60)
Glucose, Bld: 127 mg/dL — ABNORMAL HIGH (ref 65–99)
Potassium: 4.1 mmol/L (ref 3.5–5.1)
SODIUM: 139 mmol/L (ref 135–145)

## 2017-08-14 LAB — CBC
HCT: 19.2 % — ABNORMAL LOW (ref 36.0–46.0)
Hemoglobin: 6.5 g/dL — CL (ref 12.0–15.0)
MCH: 31.3 pg (ref 26.0–34.0)
MCHC: 33.9 g/dL (ref 30.0–36.0)
MCV: 92.3 fL (ref 78.0–100.0)
Platelets: 258 10*3/uL (ref 150–400)
RBC: 2.08 MIL/uL — ABNORMAL LOW (ref 3.87–5.11)
RDW: 14.2 % (ref 11.5–15.5)
WBC: 9.4 10*3/uL (ref 4.0–10.5)

## 2017-08-14 LAB — IRON AND TIBC
Iron: 58 ug/dL (ref 28–170)
Saturation Ratios: 25 % (ref 10.4–31.8)
TIBC: 235 ug/dL — AB (ref 250–450)
UIBC: 177 ug/dL

## 2017-08-14 LAB — FERRITIN: FERRITIN: 110 ng/mL (ref 11–307)

## 2017-08-14 LAB — HEMOGLOBIN AND HEMATOCRIT, BLOOD
HEMATOCRIT: 27.6 % — AB (ref 36.0–46.0)
HEMOGLOBIN: 9.1 g/dL — AB (ref 12.0–15.0)

## 2017-08-14 LAB — PREPARE RBC (CROSSMATCH)

## 2017-08-14 LAB — VITAMIN B12: VITAMIN B 12: 619 pg/mL (ref 180–914)

## 2017-08-14 LAB — FOLATE: Folate: 21.1 ng/mL (ref >5.9)

## 2017-08-14 MED ORDER — SODIUM CHLORIDE 0.9 % IV SOLN
Freq: Once | INTRAVENOUS | Status: AC
  Administered 2017-08-14: 10:00:00 via INTRAVENOUS

## 2017-08-14 MED ORDER — ONDANSETRON HCL 4 MG/2ML IJ SOLN
4.0000 mg | Freq: Four times a day (QID) | INTRAMUSCULAR | Status: DC | PRN
  Administered 2017-08-14: 23:00:00 4 mg via INTRAVENOUS
  Filled 2017-08-14: qty 2

## 2017-08-14 NOTE — Progress Notes (Signed)
CRITICAL VALUE ALERT  Critical Value:  Hgb 6.5  Date & Time Notified: 08/14/17  06:45AM  Provider Notified: Lamar Blinks  Orders Received/Actions taken: Transfuse 1 unit PRBC's

## 2017-08-14 NOTE — Progress Notes (Signed)
PT Cancellation Note  Patient Details Name: EVONNE RINKS MRN: [id number] DOB: June 01, 1941   Cancelled Treatment:    Reason Eval/Treat Not Completed: Medical issues which prohibited therapy; noted pt to be anemic and awaiting surgical fixation.  Will hold on PT eval until after surgery.   Reginia Naas 08/14/2017, 10:41 AM  Magda Kiel, PT [phone] 08/14/2017

## 2017-08-14 NOTE — Progress Notes (Signed)
Patient ID: NEKESHIA LENHARDT, female   DOB: [birth year], 77 y.o.   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 Pt reports she tripped and injured her right leg. In ED, imaging studies confirmed displaced right femoral neck fracture.   Assessment & Plan:   Principal Problem:   Hip fracture ([place]), right femoral neck - after what appears to have been mechanical fall - appreciate ortho team assistance - pt needs two U PRBC this am, plan to take to OR per ortho - will need PT eval after surgery - allow analgesia as needed  - needs pre op CXR  Active Problems:   DVT (deep venous thrombosis) ([place]) - back in 80's, currently on Xarelto for DVT prophylaxis from recent knee replacement - will hold for now due to acute blood loss anemia     Fall - plan on PT eval post op    Normocytic anemia - unclear if provoked by Xarelto  - FOBT +  - anemia panel with stable iron level  - 2 U PRBC this AM - CBC In AM - continue to hold Xarelto - will consider GI consultation depending on response to transfusion and overall clinical status     Diarrhea - bloody - stool panel pending     Pre renal azotemia - slight bump in Cr and likely in the setting of acute blood loss anemia - 2 U PRBC as noted above - BMP in AM      Obesity  - Body mass index is 30.36 kg/m.  DVT prophylaxis: SCD's Code Status: Full Family Communication: Patient and sister at bedside  Disposition Plan: to be determined   Consultants:   Ortho   Procedures:   None  Antimicrobials:   None  Subjective: Still with RLE pain but says pain medications helping.   Objective: Vitals:   08/13/17 2300 08/13/17 2347 08/14/17 0057 08/14/17 0551  BP:  (!) 98/46  (!) 92/46 (!) 95/47  Pulse:   89 96  Resp:   17 16  Temp:   98.6 F (37 C) 99.2 F (37.3 C)  TempSrc:   Oral Oral  SpO2:   94% 96%  Height:  5\' 2"  (1.575 m)      Intake/Output Summary (Last 24 hours) at 08/14/2017 0655 Last data filed at 08/14/2017 0600 Gross per 24 hour  Intake 305.33 ml  Output 250 ml  Net 55.33 ml   There were no vitals filed for this visit.  Examination:  General exam: Appears calm and comfortable  Respiratory system: Clear to auscultation. Respiratory effort normal. Cardiovascular system: S1 & S2 heard, RRR. No JVD, murmurs, rubs, gallops or clicks.  Gastrointestinal system: Abdomen is nondistended, soft and nontender. No organomegaly or masses felt.  Central nervous system: Alert and oriented. No focal neurological deficits. Extremities: TTP along the RLE  Skin: No rashes, lesions or ulcers Psychiatry: Judgement and insight appear normal. Mood & affect appropriate.   Data Reviewed: I have personally reviewed following labs and imaging studies  CBC: Recent Labs  Lab 08/13/17 1556 08/14/17 0544  WBC 7.2 9.4  NEUTROABS 5.5  --   HGB 8.9* 6.5*  HCT 26.8* 19.2*  MCV 93.1  92.3  PLT 255 947   Basic Metabolic Panel: Recent Labs  Lab 08/13/17 1556 08/14/17 0544  NA 142 139  K 4.5 4.1  CL 106 109  CO2 27 21*  GLUCOSE 122* 127*  BUN 35* 57*  CREATININE 0.99 1.04*  CALCIUM 8.9 8.5*   Coagulation Profile: Recent Labs  Lab 08/13/17 2131  INR 1.45   Anemia Panel: Recent Labs    08/13/17 2131  VITAMINB12 619  FOLATE 21.1  FERRITIN 110  TIBC 235*  IRON 58  RETICCTPCT 3.3*   Urine analysis:    Component Value Date/Time   COLORURINE YELLOW 02/25/2015 1906   APPEARANCEUR CLOUDY (A) 02/25/2015 1906   LABSPEC 1.016 02/25/2015 1906   PHURINE 6.0 02/25/2015 1906   GLUCOSEU NEGATIVE 02/25/2015 1906   HGBUR TRACE (A) 02/25/2015 1906   BILIRUBINUR NEGATIVE 02/25/2015 1906   KETONESUR NEGATIVE 02/25/2015 1906   PROTEINUR  NEGATIVE 02/25/2015 1906   UROBILINOGEN 0.2 02/25/2015 1906   NITRITE NEGATIVE 02/25/2015 1906   LEUKOCYTESUR SMALL (A) 02/25/2015 1906    Radiology Studies: Dg Lumbar Spine 2-3 Views Result Date: 08/13/2017 1. No obvious malalignment; reduced sensitivity for fracture at L4 and L5 due to severe blurring of the vertebral outlines causing poor definition of the cortex on the lateral projections. This pleura is probably due to the combination of cross-table lateral projection and motion artifact and persist despite repeat projection. 2. Chronic lucency around the T10 pedicle screws, probably from loosening or infection.   Dg Knee Complete 4 Views Right Result Date: 08/13/2017 1. No appreciable fracture. 2. Suboptimal positioning because of the hip fracture. No true lateral could be obtained. Indeterminate for knee effusion. 3. Low but stable position of the patella. 4. Total knee arthroplasty noted. Dg Hip Unilat  With Pelvis 2-3 Views Right  Result Date: 08/13/2017 1. Displaced fracture of the right femoral neck.  Scheduled Meds: . chlorhexidine  60 mL Topical Once  . gabapentin  400 mg Oral Daily  . gabapentin  800 mg Oral QHS   Continuous Infusions: . sodium chloride    . lactated ringers 20 mL/hr at 08/14/17 0011  . methocarbamol (ROBAXIN)  IV       LOS: 1 day   Time spent: 35 minutes   Faye Ramsay, MD [facility] Pager [phone]  If 7PM-7AM, please contact night-coverage [email or web address] Password [email or web address] 08/14/2017, 6:55 AM

## 2017-08-14 NOTE — Progress Notes (Cosign Needed)
   Subjective: Hospital day - 1 Patient reports pain as mild and moderate.   Patient seen in rounds by Dr. Wynelle Link. Patient is having problems with pain in the hip, requiring pain medications  Objective: Vital signs in last 24 hours: Temp:  [97.8 F (36.6 C)-99.2 F (37.3 C)] 99.2 F (37.3 C) (02/13 0551) Pulse Rate:  [75-100] 96 (02/13 0551) Resp:  [12-18] 16 (02/13 0551) BP: (69-135)/(43-74) 95/47 (02/13 0551) SpO2:  [94 %-100 %] 96 % (02/13 0551)  Intake/Output from previous day:  Intake/Output Summary (Last 24 hours) at 08/14/2017 0840 Last data filed at 08/14/2017 0600 Gross per 24 hour  Intake 245.33 ml  Output 750 ml  Net -504.67 ml    Intake/Output this shift: No intake/output data recorded.  Labs: Recent Labs    08/13/17 1556 08/14/17 0544  HGB 8.9* 6.5*   Recent Labs    08/13/17 1556 08/13/17 2131 08/14/17 0544  WBC 7.2  --  9.4  RBC 2.88* 2.46* 2.08*  HCT 26.8*  --  19.2*  PLT 255  --  258   Recent Labs    08/13/17 1556 08/14/17 0544  NA 142 139  K 4.5 4.1  CL 106 109  CO2 27 21*  BUN 35* 57*  CREATININE 0.99 1.04*  GLUCOSE 122* 127*  CALCIUM 8.9 8.5*   Recent Labs    08/13/17 2131  INR 1.45    EXAM General - Patient is Alert Extremity - Neurovascular intact Sensation intact distally Intact pulses distally Motor Function - intact, moving foot and toes well on exam.   Past Medical History:  Diagnosis Date  . Arthritis    back   . Cataracts, bilateral   . Chronic back pain    scoliosis/degenerative disc  . Complication of anesthesia    slow to wake up and combative in April 09/in Dec 09 she did much better  . DVT (deep venous thrombosis) ([place])    mid 80's  . Headache    occasionally  . History of bronchitis 5+yrs ago  . History of MRSA infection 2009  . Osteopenia   . Urinary urgency   . Weakness    numbness and tingling in both legs    Assessment/Plan: Hospital day - 1 Principal Problem:   Hip fracture  ([place]) Active Problems:   DVT (deep venous thrombosis) ([facility]) history of   Fall   Normocytic anemia  Estimated body mass index is 30.36 kg/m as calculated from the following:   Height as of this encounter: 5\' 2"  (1.575 m).   Weight as of 07/29/17: 75.3 kg (166 lb).  HGB down to 6.5  Blood ordered and recheck planned today At this time, hold on proceeding with surgery until later today to determine if and when surgery can be done  Arlee Muslim, PA-C Orthopaedic Surgery 08/14/2017, 8:40 AM

## 2017-08-15 ENCOUNTER — Encounter (HOSPITAL_COMMUNITY)
Admission: EM | Disposition: A | Discharge status: Home Health | Payer: Self-pay | Source: Home / Self Care | Attending: Internal Medicine

## 2017-08-15 ENCOUNTER — Inpatient Hospital Stay (HOSPITAL_COMMUNITY): Payer: Medicare Other

## 2017-08-15 ENCOUNTER — Encounter (HOSPITAL_COMMUNITY): Payer: Self-pay | Admitting: *Deleted

## 2017-08-15 HISTORY — PX: ESOPHAGOGASTRODUODENOSCOPY: SHX5428

## 2017-08-15 LAB — BASIC METABOLIC PANEL
ANION GAP: 8 (ref 5–15)
BUN: 39 mg/dL — ABNORMAL HIGH (ref 6–20)
CHLORIDE: 110 mmol/L (ref 101–111)
CO2: 23 mmol/L (ref 22–32)
Calcium: 8.1 mg/dL — ABNORMAL LOW (ref 8.9–10.3)
Creatinine, Ser: 0.97 mg/dL (ref 0.44–1.00)
GFR calc non Af Amer: 56 mL/min — ABNORMAL LOW (ref >60)
GLUCOSE: 103 mg/dL — AB (ref 65–99)
Potassium: 3.8 mmol/L (ref 3.5–5.1)
Sodium: 141 mmol/L (ref 135–145)

## 2017-08-15 LAB — CBC
HEMATOCRIT: 25.4 % — AB (ref 36.0–46.0)
HEMOGLOBIN: 8.6 g/dL — AB (ref 12.0–15.0)
MCH: 29.8 pg (ref 26.0–34.0)
MCHC: 33.9 g/dL (ref 30.0–36.0)
MCV: 87.9 fL (ref 78.0–100.0)
Platelets: 197 10*3/uL (ref 150–400)
RBC: 2.89 MIL/uL — ABNORMAL LOW (ref 3.87–5.11)
RDW: 15.9 % — ABNORMAL HIGH (ref 11.5–15.5)
WBC: 6.9 10*3/uL (ref 4.0–10.5)

## 2017-08-15 LAB — GASTROINTESTINAL PANEL BY PCR, STOOL (REPLACES STOOL CULTURE)
ASTROVIRUS: NOT DETECTED
Adenovirus F40/41: NOT DETECTED
CAMPYLOBACTER SPECIES: NOT DETECTED
CYCLOSPORA CAYETANENSIS: NOT DETECTED
Cryptosporidium: NOT DETECTED
ENTAMOEBA HISTOLYTICA: NOT DETECTED
ENTEROTOXIGENIC E COLI (ETEC): NOT DETECTED
Enteroaggregative E coli (EAEC): NOT DETECTED
Enteropathogenic E coli (EPEC): NOT DETECTED
Giardia lamblia: NOT DETECTED
Norovirus GI/GII: NOT DETECTED
PLESIMONAS SHIGELLOIDES: NOT DETECTED
Rotavirus A: NOT DETECTED
SALMONELLA SPECIES: NOT DETECTED
SAPOVIRUS (I, II, IV, AND V): NOT DETECTED
SHIGA LIKE TOXIN PRODUCING E COLI (STEC): NOT DETECTED
SHIGELLA/ENTEROINVASIVE E COLI (EIEC): NOT DETECTED
VIBRIO CHOLERAE: NOT DETECTED
VIBRIO SPECIES: NOT DETECTED
Yersinia enterocolitica: NOT DETECTED

## 2017-08-15 LAB — PREPARE RBC (CROSSMATCH)

## 2017-08-15 SURGERY — EGD (ESOPHAGOGASTRODUODENOSCOPY)
Anesthesia: Moderate Sedation

## 2017-08-15 MED ORDER — CEFAZOLIN SODIUM-DEXTROSE 2-4 GM/100ML-% IV SOLN
2.0000 g | INTRAVENOUS | Status: AC
  Administered 2017-08-16: 2 g via INTRAVENOUS
  Filled 2017-08-15: qty 100

## 2017-08-15 MED ORDER — FENTANYL CITRATE (PF) 100 MCG/2ML IJ SOLN
INTRAMUSCULAR | Status: DC | PRN
  Administered 2017-08-15: 25 ug via INTRAVENOUS

## 2017-08-15 MED ORDER — HYDROMORPHONE HCL 1 MG/ML IJ SOLN
1.0000 mg | INTRAMUSCULAR | Status: DC | PRN
  Administered 2017-08-15 – 2017-08-16 (×7): 1 mg via INTRAVENOUS
  Filled 2017-08-15 (×7): qty 1

## 2017-08-15 MED ORDER — MIDAZOLAM HCL 5 MG/ML IJ SOLN
INTRAMUSCULAR | Status: AC
  Filled 2017-08-15: qty 2

## 2017-08-15 MED ORDER — CEFAZOLIN SODIUM-DEXTROSE 2-4 GM/100ML-% IV SOLN: 2.0000 g | INTRAVENOUS | Status: DC

## 2017-08-15 MED ORDER — POVIDONE-IODINE 10 % EX SWAB: 2.0000 "application " | Freq: Once | CUTANEOUS | Status: DC

## 2017-08-15 MED ORDER — SODIUM CHLORIDE 0.9 % IV SOLN
Freq: Once | INTRAVENOUS | Status: AC
  Administered 2017-08-15: 11:00:00 via INTRAVENOUS

## 2017-08-15 MED ORDER — TRANEXAMIC ACID 1000 MG/10ML IV SOLN: 1000.0000 mg | INTRAVENOUS | Status: DC

## 2017-08-15 MED ORDER — DIPHENHYDRAMINE HCL 50 MG/ML IJ SOLN
INTRAMUSCULAR | Status: AC
  Filled 2017-08-15: qty 1

## 2017-08-15 MED ORDER — POVIDONE-IODINE 10 % EX SWAB
2.0000 "application " | Freq: Once | CUTANEOUS | Status: AC
  Administered 2017-08-16: 2 via TOPICAL

## 2017-08-15 MED ORDER — FENTANYL CITRATE (PF) 100 MCG/2ML IJ SOLN
INTRAMUSCULAR | Status: AC
  Filled 2017-08-15: qty 2

## 2017-08-15 MED ORDER — MIDAZOLAM HCL 10 MG/2ML IJ SOLN
INTRAMUSCULAR | Status: DC | PRN
  Administered 2017-08-15: 2 mg via INTRAVENOUS

## 2017-08-15 MED ORDER — ACETAMINOPHEN 10 MG/ML IV SOLN
1000.0000 mg | INTRAVENOUS | Status: AC
  Administered 2017-08-16: 1000 mg via INTRAVENOUS
  Filled 2017-08-15: qty 100

## 2017-08-15 MED ORDER — PANTOPRAZOLE SODIUM 40 MG IV SOLR
40.0000 mg | Freq: Two times a day (BID) | INTRAVENOUS | Status: DC
  Administered 2017-08-15 – 2017-08-19 (×8): 40 mg via INTRAVENOUS
  Filled 2017-08-15 (×8): qty 40

## 2017-08-15 MED ORDER — OXYCODONE HCL 5 MG PO TABS
20.0000 mg | ORAL_TABLET | ORAL | Status: DC | PRN
  Administered 2017-08-15 – 2017-08-17 (×8): 20 mg via ORAL
  Filled 2017-08-15 (×8): qty 4

## 2017-08-15 MED ORDER — METHOCARBAMOL 500 MG PO TABS
750.0000 mg | ORAL_TABLET | Freq: Four times a day (QID) | ORAL | Status: DC | PRN
  Administered 2017-08-15 – 2017-08-20 (×16): 750 mg via ORAL
  Filled 2017-08-15 (×17): qty 2

## 2017-08-15 MED ORDER — CHLORHEXIDINE GLUCONATE 4 % EX LIQD: 60.0000 mL | Freq: Once | CUTANEOUS | Status: DC

## 2017-08-15 MED ORDER — BUTAMBEN-TETRACAINE-BENZOCAINE 2-2-14 % EX AERO
INHALATION_SPRAY | CUTANEOUS | Status: DC | PRN
  Administered 2017-08-15: 2 via TOPICAL

## 2017-08-15 NOTE — Consult Note (Signed)
Referring Provider:  Dr. Doyle Askew Primary Care Physician:  Jonathon Jordan, MD Primary Gastroenterologist:  Dr. Amedeo Plenty   Reason for Consultation:  Anemia, GI bleed  HPI: Sandra [name] is a 77 y.o. female past medical history of DVT, history of recent right knee replacement was on Xarelto is currently admitted to the hospital status post fall. Patient was found to have a displaced right femoral neck fracture. Patient was complaining of bloody diarrhea. Hemoglobin down to 6.5 yesterday. GI is consulted for further evaluation.  Patient with history of anemia dated back to April 2016 with hemoglobin of 7.2 at that time. Patient's hemoglobin was 8.9 on admission on 08/13/2017 which subsequently dropped to 6.5 and improved to 8.6 after transfusion.  Patient seen and examined at bedside. Patient started noticing black tarry stool since admission. Discussed with the nursing staff. She also confirmed dark stools. She denied any bright blood per rectum. Complaining of acid reflux, denied dysphagia or odynophagia. Very occasional NSAID use. She denies any abdominal pain. Denied any weight loss.  Colonoscopy in August 2016 by Dr. Amedeo Plenty  showed tortuous colon. No polyps.  Past Medical History:  Diagnosis Date  . Arthritis    back   . Cataracts, bilateral   . Chronic back pain    scoliosis/degenerative disc  . Complication of anesthesia    slow to wake up and combative in April 09/in Dec 09 she did much better  . DVT (deep venous thrombosis) ([place])    mid 80's  . Headache    occasionally  . History of bronchitis 5+yrs ago  . History of MRSA infection 2009  . Osteopenia   . Urinary urgency   . Weakness    numbness and tingling in both legs    Past Surgical History:  Procedure Laterality Date  . ABDOMINAL HYSTERECTOMY  2009  . APPENDECTOMY  1966  . BACK SURGERY  2009/2010  . bladder tacked   2009  . COLONOSCOPY    . TOTAL KNEE ARTHROPLASTY Right 07/29/2017   Procedure: RIGHT TOTAL KNEE  ARTHROPLASTY;  Surgeon: Gaynelle Arabian, MD;  Location: [facility]S;  Service: Orthopedics;  Laterality: Right;  . TUBAL LIGATION  1986    Prior to Admission medications   Medication Sig Start Date End Date Taking? Authorizing Provider  alendronate (FOSAMAX) 70 MG tablet Take 70 mg by mouth once a week. Take with a full glass of water on an empty stomach.   Yes [provider]  gabapentin (NEURONTIN) 400 MG capsule Take 400-800 mg by mouth See admin instructions. Take 400 mg in the morning and 800 mg at night   Yes [provider]  methocarbamol (ROBAXIN) 500 MG tablet Take 1 tablet (500 mg total) by mouth every 6 (six) hours as needed for muscle spasms. 07/30/17  Yes Perkins, Alexzandrew L, PA-C  Oxycodone HCl 10 MG TABS Take 1-2 tablets (10-20 mg total) by mouth every 4 (four) hours as needed. For pain 07/30/17  Yes Perkins, Alexzandrew L, PA-C  Polyvinyl Alcohol-Povidone (REFRESH OP) Apply 1 drop to eye 4 (four) times daily as needed (dry eyes).   Yes [provider]  potassium gluconate (HM POTASSIUM) 595 MG TABS tablet Take 595 mg by mouth at bedtime.    Yes [provider]  rivaroxaban (XARELTO) 10 MG TABS tablet Take 1 tablet (10 mg total) by mouth daily with breakfast. Take Xarelto for two and a half more weeks following discharge from the hospital, then discontinue Xarelto. Once the patient has completed the  blood thinner regimen, then take a Baby 81 mg Aspirin daily for three more weeks. 07/31/17  Yes Perkins, Alexzandrew L, PA-C  traMADol (ULTRAM) 50 MG tablet Take 1-2 tablets (50-100 mg total) by mouth every 6 (six) hours as needed (mild pain). Patient not taking: Reported on 08/13/2017 07/30/17   Dara Lords, Alexzandrew L, PA-C    Scheduled Meds: . chlorhexidine  60 mL Topical Once  . gabapentin  400 mg Oral Daily  . gabapentin  800 mg Oral QHS   Continuous Infusions: . lactated ringers 20 mL/hr at 08/14/17 2140  . methocarbamol (ROBAXIN)  IV    . [START  ON 08/16/2017] tranexamic acid     PRN Meds:.HYDROmorphone (DILAUDID) injection, methocarbamol (ROBAXIN)  IV, methocarbamol, ondansetron (ZOFRAN) IV, oxyCODONE  Allergies as of 08/13/2017 - Review Complete 08/13/2017  Allergen Reaction Noted  . Clarithromycin Diarrhea 09/05/2013  . Codeine Other (See Comments) 11/15/2011  . Resinol [dermatological products, misc.] Other (See Comments) 11/15/2011  . Sulfa antibiotics Rash 11/15/2011    No family history on file.  Social History   Socioeconomic History  . Marital status: Married    Spouse name: Not on file  . Number of children: Not on file  . Years of education: Not on file  . Highest education level: Not on file  Social Needs  . Financial resource strain: Not on file  . Food insecurity - worry: Not on file  . Food insecurity - inability: Not on file  . Transportation needs - medical: Not on file  . Transportation needs - non-medical: Not on file  Occupational History  . Not on file  Tobacco Use  . Smoking status: Former Smoker    Packs/day: 1.00    Years: 50.00    Pack years: 50.00    Last attempt to quit: 11/2016    Years since quitting: 0.7  . Smokeless tobacco: Never Used  Substance and Sexual Activity  . Alcohol use: Yes    Comment: monthly  . Drug use: No  . Sexual activity: Not Currently    Birth control/protection: Surgical  Other Topics Concern  . Not on file  Social History Narrative  . Not on file    Review of Systems: Review of Systems  Constitutional: Negative for chills and fever.  HENT: Negative for hearing loss and tinnitus.   Eyes: Negative for blurred vision and double vision.  Respiratory: Negative for cough, hemoptysis and shortness of breath.   Cardiovascular: Negative for chest pain and palpitations.  Gastrointestinal: Positive for heartburn and melena. Negative for abdominal pain, nausea and vomiting.  Genitourinary: Negative for dysuria and urgency.  Musculoskeletal: Positive for back  pain, falls, joint pain and myalgias.  Skin: Negative for itching and rash.  Neurological: Negative for seizures and loss of consciousness.  Endo/Heme/Allergies: Does not bruise/bleed easily.  Psychiatric/Behavioral: Negative for hallucinations and suicidal ideas.    Physical Exam: Vital signs: Vitals:   08/15/17 1024 08/15/17 1101  BP: (!) 102/52 (!) 99/49  Pulse: 87 86  Resp: 16 14  Temp: 98.2 F (36.8 C) 97.9 F (36.6 C)  SpO2: 96% 100%   Last BM Date: 08/15/17 Physical Exam  Constitutional: She is oriented to person, place, and time. She appears well-developed and well-nourished. No distress.  HENT:  Head: Normocephalic and atraumatic.  Mouth/Throat: No oropharyngeal exudate.  Eyes: EOM are normal. No scleral icterus.  Neck: Normal range of motion. Neck supple. No thyromegaly present.  Cardiovascular: Normal rate, regular rhythm and normal heart sounds.  Pulmonary/Chest:  Effort normal and breath sounds normal. No respiratory distress.  Abdominal: Soft. Bowel sounds are normal. She exhibits no distension. There is no tenderness. There is no rebound and no guarding.  Musculoskeletal: She exhibits tenderness. She exhibits no edema.  Neurological: She is alert and oriented to person, place, and time.  Skin: Skin is warm. No erythema.  Psychiatric: She has a normal mood and affect. Thought content normal.  Vitals reviewed.   GI:  Lab Results: Recent Labs    08/13/17 1556 08/14/17 0544 08/14/17 1849 08/15/17 0610  WBC 7.2 9.4  --  6.9  HGB 8.9* 6.5* 9.1* 8.6*  HCT 26.8* 19.2* 27.6* 25.4*  PLT 255 258  --  197   BMET Recent Labs    08/13/17 1556 08/14/17 0544 08/15/17 0610  NA 142 139 141  K 4.5 4.1 3.8  CL 106 109 110  CO2 27 21* 23  GLUCOSE 122* 127* 103*  BUN 35* 57* 39*  CREATININE 0.99 1.04* 0.97  CALCIUM 8.9 8.5* 8.1*   LFT No results for input(s): PROT, ALBUMIN, AST, ALT, ALKPHOS, BILITOT, BILIDIR, IBILI in the last 72 hours. PT/INR Recent Labs     08/13/17 2131  LABPROT 17.5*  INR 1.45     Studies/Results: Dg Lumbar Spine 2-3 Views  Result Date: 08/13/2017 CLINICAL DATA:  Fall, low back pain. EXAM: LUMBAR SPINE - 2-3 VIEW COMPARISON:  11/16/2016 FINDINGS: The patient has posterolateral rod and pedicle screw fixation involving all levels from T10 through the sacrum and also extending into the iliac bones. Similar to prior. I do not see a discrete hardware failure. However, there is some lucency around the T10 pedicle screws which is chronic and could indicate loosening or infection. Lateral view attempts are problematic and the cortex of the L4 and L5 vertebra is essentially not visible on the lateral projections (presumably due to cross-table lateral requirement and difficulty with motion), but appears grossly intact on the frontal view. IMPRESSION: 1. No obvious malalignment; reduced sensitivity for fracture at L4 and L5 due to severe blurring of the vertebral outlines causing poor definition of the cortex on the lateral projections. This pleura is probably due to the combination of cross-table lateral projection and motion artifact and persist despite repeat projection. 2. Chronic lucency around the T10 pedicle screws, probably from loosening or infection. Electronically Signed   By: Van Clines M.D.   On: 08/13/2017 17:39   Dg Chest Port 1 View  Result Date: 08/14/2017 CLINICAL DATA:  Weakness, shortness of breath, anemia, hip pain EXAM: PORTABLE CHEST 1 VIEW COMPARISON:  09/30/2014 FINDINGS: Lungs are clear.  No pleural effusion or pneumothorax. The heart is normal in size. Thoracolumbar spine fixation hardware, incompletely visualized. IMPRESSION: No evidence of acute cardiopulmonary disease. Electronically Signed   By: Julian Hy M.D.   On: 08/14/2017 11:36   Dg Knee Complete 4 Views Right  Result Date: 08/13/2017 CLINICAL DATA:  Fall, acute hip fracture, pain in the right lower extremity. EXAM: RIGHT KNEE - COMPLETE  4+ VIEW COMPARISON:  12/30/2015 FINDINGS: Interval total knee arthroplasty on the right. Suboptimal positioning due to the hip fracture causing difficulty in alignment. A true lateral projection is not obtained, and the only projections obtainable are somewhat oblique. Indeterminate for knee effusion. Somewhat low position of the patella although this has been present since 2016 and may simply represent normal variation rather than patella baja. I do not see an obvious cortical discontinuity. IMPRESSION: 1. No appreciable fracture. 2. Suboptimal positioning because of  the hip fracture. No true lateral could be obtained. Indeterminate for knee effusion. 3. Low but stable position of the patella. 4. Total knee arthroplasty noted. Electronically Signed   By: Van Clines M.D.   On: 08/13/2017 17:36   Dg Hip Unilat  With Pelvis 2-3 Views Right  Result Date: 08/13/2017 CLINICAL DATA:  Fall landing on the right side.  Right hip pain. EXAM: DG HIP (WITH OR WITHOUT PELVIS) 2-3V RIGHT COMPARISON:  CT pelvis 02/25/2015 FINDINGS: Displaced fracture the right femoral neck. Posterolateral rod and pedicle screw fixation in the lower lumbar spine and also involving the sacrum and iliac bones. If No other fractures are identified. IMPRESSION: 1. Displaced fracture of the right femoral neck. Electronically Signed   By: Van Clines M.D.   On: 08/13/2017 17:34    Impression/Plan: - Melena - Acute drop in hemoglobin. Status post blood transfusion -use of anticoagulation. Last dose of Xarelto  2 days ago - Displaced right femoral neck fracture  Recommendations ---------------------- - Patient with melena. Also elevated BUN which point towards upper GI bleed.  - EGD today. Start twice a day PPI.  Risks (bleeding, infection, bowel perforation that could require surgery, sedation-related changes in cardiopulmonary systems), benefits (identification and possible treatment of source of symptoms, exclusion of  certain causes of symptoms), and alternatives (watchful waiting, radiographic imaging studies, empiric medical treatment)  were explained to patient and family in detail and patient wishes to proceed.  Further plan based on endoscopic finding.    LOS: 2 days   Otis Brace  MD, FACP 08/15/2017, 11:53 AM  Contact #  [phone]

## 2017-08-15 NOTE — Brief Op Note (Addendum)
08/13/2017 - 08/15/2017  3:41 PM  PATIENT:  Tama High  77 y.o. female  PRE-OPERATIVE DIAGNOSIS:  melena  POST-OPERATIVE DIAGNOSIS:  Large gastric ulcer  PROCEDURE:  Procedure(s): ESOPHAGOGASTRODUODENOSCOPY (EGD) (N/A)  SURGEON:  Surgeon(s) and Role:    * Gonzalo Waymire, MD - Primary  Findings ---------- - EGD showed large gastric ulcer in prepyloric area with pigmented spot. No active bleeding biopsies from ulcer edges were taken. Random gastric biopsy also taken for H. Pylori  Recommendations ------------------------ - Okay to proceed with orthopedic intervention from GI standpoint. - There is no actively bleeding at this time, but she remains at risk of GI bleed from  large gastric ulcer after starting anticoagulation. Findings and risk of recurrent GI bleed discussed with patient and patient's family. - Avoid NSAIDs - Continue IV twice a day PPI while in the hospital. Switch to by mouth twice a day PPI on discharge. - Continue twice a day PPI for 2 months. - Avoid NSAIDs - Repeat EGD in 2 months to document healing - Follow-up with me in GI clinic in 6 weeks.  Otis Brace MD, Lithium 08/15/2017, 3:43 PM  Contact #  [phone]

## 2017-08-15 NOTE — Op Note (Signed)
[facility] Patient Name: Sandra [name] Procedure Date: 08/15/2017 MRN: [id number] Attending MD: Otis Brace , MD Date of Birth: June 10, 1941 CSN: [id number] Age: 77 Admit Type: Inpatient Procedure:                Upper GI endoscopy Indications:              Melena Providers:                Otis Brace, MD, Cleda Daub, RN, Elspeth Cho Tech., Technician, Charolette Child,                            Technician Referring MD:              Medicines:                Midazolam 2 mg IV, Fentanyl 25 micrograms IV Complications:            No immediate complications. Estimated Blood Loss:     Estimated blood loss was minimal. Procedure:                Pre-Anesthesia Assessment:                           - Prior to the procedure, a History and Physical                            was performed, and patient medications and                            allergies were reviewed. The patient's tolerance of                            previous anesthesia was also reviewed. The risks                            and benefits of the procedure and the sedation                            options and risks were discussed with the patient.                            All questions were answered, and informed consent                            was obtained. Prior Anticoagulants: The patient has                            taken Xarelto (rivaroxaban), last dose was 2 days                            prior to procedure. ASA Grade Assessment: I - A  normal, healthy patient. After reviewing the risks                            and benefits, the patient was deemed in                            satisfactory condition to undergo the procedure.                           After obtaining informed consent, the endoscope was                            passed under direct vision. Throughout the                            procedure, the patient's blood  pressure, pulse, and                            oxygen saturations were monitored continuously. The                            Endoscope was introduced through the mouth, and                            advanced to the second part of duodenum. The upper                            GI endoscopy was accomplished without difficulty.                            The patient tolerated the procedure well. Scope In: Scope Out: Findings:      The Z-line was regular and was found 39 cm from the incisors.      The exam of the esophagus was otherwise normal.      One large non-bleeding cratered gastric ulcer with pigmented material       was found in the prepyloric region of the stomach. Biopsies were taken       with a cold forceps for histology.      Scattered mild inflammation was found in the gastric fundus. Biopsies       were taken with a cold forceps for Helicobacter pylori testing.      The cardia and gastric fundus were normal on retroflexion.      The duodenal bulb, first portion of the duodenum and second portion of       the duodenum were normal. Impression:               - Z-line regular, 39 cm from the incisors.                           - Non-bleeding gastric ulcer with pigmented                            material. Biopsied.                           -  Gastritis. Biopsied.                           - Normal duodenal bulb, first portion of the                            duodenum and second portion of the duodenum. Moderate Sedation:      Moderate (conscious) sedation was administered by the endoscopy nurse       and supervised by the endoscopist. The following parameters were       monitored: oxygen saturation, heart rate, blood pressure, and response       to care. Recommendation:           - Return patient to hospital ward for ongoing care.                           - Resume previous diet.                           - Continue present medications.                           - Await  pathology results.                           - Repeat upper endoscopy in 2 months to check                            healing.                           - Return to my office in 6 weeks. Procedure Code(s):        --- Professional ---                           [phone], Esophagogastroduodenoscopy, flexible,                            transoral; with biopsy, single or multiple Diagnosis Code(s):        --- Professional ---                           K25.9, Gastric ulcer, unspecified as acute or                            chronic, without hemorrhage or perforation                           K29.70, Gastritis, unspecified, without bleeding                           K92.1, Melena (includes Hematochezia) CPT copyright 2016 American Medical Association. All rights reserved. The codes documented in this report are preliminary and upon coder review may  be revised to meet current compliance requirements. Otis Brace, MD Otis Brace, MD 08/15/2017 3:41:00 PM Number of Addenda: 0

## 2017-08-15 NOTE — Progress Notes (Signed)
   Subjective: Hospital day - 2 Patient reports pain as mild and moderate.   Patient seen in rounds by Dr. Wynelle Link. Patient is having problems with pain in the hip, requiring pain medications Unable to proceed with surgery yesterday due to HGB 6.5, concern for GI Bleed. Two units yesterday and HGB up to 8.6 this morning.  Plan is for total hip procedure.  Will give two more units today. Dr. Wynelle Link spoke with Dr. Lyla Glassing who will be available to operate tomorrow. New updated consent ordered along with blood.  Objective: Vital signs in last 24 hours: Temp:  [98.2 F (36.8 C)-99 F (37.2 C)] 98.2 F (36.8 C) (02/14 0523) Pulse Rate:  [61-88] 76 (02/14 0523) Resp:  [14-16] 15 (02/14 0523) BP: (92-120)/(39-59) 96/50 (02/14 0523) SpO2:  [96 %-99 %] 99 % (02/14 0523)  Intake/Output from previous day:  Intake/Output Summary (Last 24 hours) at 08/15/2017 0741 Last data filed at 08/15/2017 0600 Gross per 24 hour  Intake 1750 ml  Output 1325 ml  Net 425 ml    Intake/Output this shift: No intake/output data recorded.  Labs: Recent Labs    08/13/17 1556 08/14/17 0544 08/14/17 1849 08/15/17 0610  HGB 8.9* 6.5* 9.1* 8.6*   Recent Labs    08/14/17 0544 08/14/17 1849 08/15/17 0610  WBC 9.4  --  6.9  RBC 2.08*  --  2.89*  HCT 19.2* 27.6* 25.4*  PLT 258  --  197   Recent Labs    08/14/17 0544 08/15/17 0610  NA 139 141  K 4.1 3.8  CL 109 110  CO2 21* 23  BUN 57* 39*  CREATININE 1.04* 0.97  GLUCOSE 127* 103*  CALCIUM 8.5* 8.1*   Recent Labs    08/13/17 2131  INR 1.45    EXAM General - Patient is Alert and Appropriate Extremity - Neurovascular intact Sensation intact distally Intact pulses distally Dressing - dressing C/D/I Motor Function - intact, moving foot and toes well on exam.   Past Medical History:  Diagnosis Date  . Arthritis    back   . Cataracts, bilateral   . Chronic back pain    scoliosis/degenerative disc  . Complication of anesthesia    slow to wake up and combative in April 09/in Dec 09 she did much better  . DVT (deep venous thrombosis) ([place])    mid 80's  . Headache    occasionally  . History of bronchitis 5+yrs ago  . History of MRSA infection 2009  . Osteopenia   . Urinary urgency   . Weakness    numbness and tingling in both legs    Assessment/Plan: Hospital day - 2 Principal Problem:   Hip fracture ([place]) Active Problems:   DVT (deep venous thrombosis) ([facility]) history of   Fall   Normocytic anemia  Estimated body mass index is 30.36 kg/m as calculated from the following:   Height as of this encounter: 5\' 2"  (1.575 m).   Weight as of 07/29/17: 75.3 kg (166 lb). Bedrest for now Regular diet today. NPO after MN tonight unless advised otherwise by Dr. Lyla Glassing Recheck labs in the morning.  Arlee Muslim, PA-C Orthopaedic Surgery 08/15/2017, 7:41 AM

## 2017-08-15 NOTE — Progress Notes (Signed)
Patient ID: Sandra [name], female   DOB: Oct 04, 1940, 77 y.o.   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 Pt reports she tripped and injured her right leg. In ED, imaging studies confirmed displaced right femoral neck fracture.   Assessment & Plan:   Principal Problem:   Hip fracture ([place]), right femoral neck - still with pain but says pain medications help  - appreciate ortho team assistance  - pt still needs PRBC transfusion so holding off on surgery for now - I have consulted GI for further assistance  - plan to keep NPO after midnight, possible to take to OR in AM if GI team clears pt for surgery  - continue analgesia as needed  - due to family history of heart disease, will order ECHO   Active Problems:   DVT (deep venous thrombosis) ([place]) - back in 80's, currently on Xarelto for DVT prophylaxis from recent knee replacement - Xarelto has been on hold due to acute blood loss anemia     Fall - plan on PT eval post op    Normocytic anemia - unclear if provoked by Xarelto  - FOBT +  - anemia panel with stable iron level  - pt transfused two U PRBC 02/13, Hg was up post transfusion but still down this AM - continue to hold Xarelto - GI consulted for assistance     Diarrhea - bloody - stool panel pending     Pre renal azotemia - slight bump in Cr and likely in the setting of acute blood loss anemia - no resolved - will repeat BMP in AM    Obesity  - Body mass index is 30.36 kg/m.  DVT prophylaxis: SCD's Code Status: Full Family Communication: pt and sister at bedside  Disposition Plan: to be determined   Consultants:   Ortho   GI  Procedures:   None  Antimicrobials:    None  Subjective: Still with pain in the right LE.   Objective: Vitals:   08/14/17 2145 08/15/17 0220 08/15/17 0523 08/15/17 1024  BP: (!) 99/56 (!) 104/49 (!) 96/50 (!) 102/52  Pulse: 77 78 76 87  Resp: 16 16 15 16   Temp: 99 F (37.2 C)  98.2 F (36.8 C) 98.2 F (36.8 C)  TempSrc: Axillary  Axillary Oral  SpO2: 98% 98% 99% 96%  Height:        Intake/Output Summary (Last 24 hours) at 08/15/2017 1056 Last data filed at 08/15/2017 0858 Gross per 24 hour  Intake 1810 ml  Output 1450 ml  Net 360 ml   There were no vitals filed for this visit.  Physical Exam  Constitutional: Appears well-developed and well-nourished. No distress.  CVS: RRR, S1/S2 +, no murmurs, no gallops, no carotid bruit.  Pulmonary: Effort and breath sounds normal, no stridor, rhonchi, wheezes, rales.  Abdominal: Soft. BS +,  no distension, tenderness, rebound or guarding.  Musculoskeletal: TTP in the right hip area  Neuro: Alert. Normal reflexes, muscle tone coordination. No cranial nerve deficit. Skin: Skin is warm and dry. No rash noted. Not diaphoretic. No erythema. No pallor.  Psychiatric: Normal mood and affect. Behavior, judgment, thought content normal.   Data  Reviewed: I have personally reviewed following labs and imaging studies  CBC: Recent Labs  Lab 08/13/17 1556 08/14/17 0544 08/14/17 1849 08/15/17 0610  WBC 7.2 9.4  --  6.9  NEUTROABS 5.5  --   --   --   HGB 8.9* 6.5* 9.1* 8.6*  HCT 26.8* 19.2* 27.6* 25.4*  MCV 93.1 92.3  --  87.9  PLT 255 258  --  213   Basic Metabolic Panel: Recent Labs  Lab 08/13/17 1556 08/14/17 0544 08/15/17 0610  NA 142 139 141  K 4.5 4.1 3.8  CL 106 109 110  CO2 27 21* 23  GLUCOSE 122* 127* 103*  BUN 35* 57* 39*  CREATININE 0.99 1.04* 0.97  CALCIUM 8.9 8.5* 8.1*   Coagulation Profile: Recent Labs  Lab 08/13/17 2131  INR 1.45   Anemia Panel: Recent Labs    08/13/17 2131  VITAMINB12 619  FOLATE 21.1  FERRITIN 110  TIBC 235*  IRON  58  RETICCTPCT 3.3*   Urine analysis:    Component Value Date/Time   COLORURINE YELLOW 02/25/2015 1906   APPEARANCEUR CLOUDY (A) 02/25/2015 1906   LABSPEC 1.016 02/25/2015 1906   PHURINE 6.0 02/25/2015 1906   GLUCOSEU NEGATIVE 02/25/2015 1906   HGBUR TRACE (A) 02/25/2015 1906   BILIRUBINUR NEGATIVE 02/25/2015 1906   KETONESUR NEGATIVE 02/25/2015 1906   PROTEINUR NEGATIVE 02/25/2015 1906   UROBILINOGEN 0.2 02/25/2015 1906   NITRITE NEGATIVE 02/25/2015 1906   LEUKOCYTESUR SMALL (A) 02/25/2015 1906    Radiology Studies: Dg Lumbar Spine 2-3 Views Result Date: 08/13/2017 1. No obvious malalignment; reduced sensitivity for fracture at L4 and L5 due to severe blurring of the vertebral outlines causing poor definition of the cortex on the lateral projections. This pleura is probably due to the combination of cross-table lateral projection and motion artifact and persist despite repeat projection. 2. Chronic lucency around the T10 pedicle screws, probably from loosening or infection.   Dg Knee Complete 4 Views Right Result Date: 08/13/2017 1. No appreciable fracture. 2. Suboptimal positioning because of the hip fracture. No true lateral could be obtained. Indeterminate for knee effusion. 3. Low but stable position of the patella. 4. Total knee arthroplasty noted. Dg Hip Unilat  With Pelvis 2-3 Views Right  Result Date: 08/13/2017 1. Displaced fracture of the right femoral neck.  Scheduled Meds: . chlorhexidine  60 mL Topical Once  . gabapentin  400 mg Oral Daily  . gabapentin  800 mg Oral QHS   Continuous Infusions: . sodium chloride    . lactated ringers 20 mL/hr at 08/14/17 2140  . methocarbamol (ROBAXIN)  IV       LOS: 2 days   Time spent: 35 minutes  35 minutes with > 50% of time discussing current diagnostic test results, clinical impression and plan of care.  Faye Ramsay, MD [facility] Pager [phone]  If 7PM-7AM, please contact  night-coverage [email or web address] Password [facility] 08/15/2017, 10:56 AM

## 2017-08-15 NOTE — Progress Notes (Signed)
PT Cancellation Note  Patient Details Name: Sandra [name] MRN: [id number] DOB: 31-Mar-1941   Cancelled Treatment:     PT order received but eval deferred.  Pt continues on hold with femoral neck fx - RN reports surgery tomorrow.  Please reorder PT post op.   Caroleena Paolini 08/15/2017, 8:20 AM

## 2017-08-15 NOTE — CV Procedure (Signed)
Attempted 2D Echo, Patient was leaving to Endo.  Sandra [name]

## 2017-08-16 ENCOUNTER — Inpatient Hospital Stay (HOSPITAL_COMMUNITY): Payer: Medicare Other

## 2017-08-16 ENCOUNTER — Encounter (HOSPITAL_COMMUNITY)
Admission: EM | Disposition: A | Discharge status: Home Health | Payer: Self-pay | Source: Home / Self Care | Attending: Internal Medicine

## 2017-08-16 ENCOUNTER — Inpatient Hospital Stay (HOSPITAL_COMMUNITY): Payer: Medicare Other | Admitting: Certified Registered Nurse Anesthetist

## 2017-08-16 ENCOUNTER — Encounter (HOSPITAL_COMMUNITY): Payer: Self-pay | Admitting: Gastroenterology

## 2017-08-16 DIAGNOSIS — S72001A Fracture of unspecified part of neck of right femur, initial encounter for closed fracture: Secondary | ICD-10-CM | POA: Diagnosis present

## 2017-08-16 DIAGNOSIS — I361 Nonrheumatic tricuspid (valve) insufficiency: Secondary | ICD-10-CM

## 2017-08-16 HISTORY — PX: TOTAL HIP ARTHROPLASTY: SHX124

## 2017-08-16 LAB — TYPE AND SCREEN
ABO/RH(D): O POS
Antibody Screen: NEGATIVE
UNIT DIVISION: 0
Unit division: 0
Unit division: 0
Unit division: 0

## 2017-08-16 LAB — BPAM RBC
BLOOD PRODUCT EXPIRATION DATE: 201903142359
Blood Product Expiration Date: 201903142359
Blood Product Expiration Date: 201903192359
Blood Product Expiration Date: 201903192359
ISSUE DATE / TIME: 201902131359
ISSUE DATE / TIME: 201902141748
ISSUE DATE / TIME: 201902130906
ISSUE DATE / TIME: 201902141024
UNIT TYPE AND RH: 5100
Unit Type and Rh: 5100
Unit Type and Rh: 5100
Unit Type and Rh: 5100

## 2017-08-16 LAB — CBC
HEMATOCRIT: 32.4 % — AB (ref 36.0–46.0)
HEMOGLOBIN: 10.8 g/dL — AB (ref 12.0–15.0)
MCH: 29.6 pg (ref 26.0–34.0)
MCHC: 33.3 g/dL (ref 30.0–36.0)
MCV: 88.8 fL (ref 78.0–100.0)
Platelets: 176 10*3/uL (ref 150–400)
RBC: 3.65 MIL/uL — AB (ref 3.87–5.11)
RDW: 15.4 % (ref 11.5–15.5)
WBC: 6.3 10*3/uL (ref 4.0–10.5)

## 2017-08-16 LAB — BASIC METABOLIC PANEL
ANION GAP: 7 (ref 5–15)
BUN: 21 mg/dL — ABNORMAL HIGH (ref 6–20)
CALCIUM: 8.3 mg/dL — AB (ref 8.9–10.3)
CO2: 25 mmol/L (ref 22–32)
Chloride: 107 mmol/L (ref 101–111)
Creatinine, Ser: 0.98 mg/dL (ref 0.44–1.00)
GFR, EST NON AFRICAN AMERICAN: 55 mL/min — AB (ref >60)
GLUCOSE: 105 mg/dL — AB (ref 65–99)
POTASSIUM: 3.9 mmol/L (ref 3.5–5.1)
Sodium: 139 mmol/L (ref 135–145)

## 2017-08-16 LAB — ECHOCARDIOGRAM COMPLETE: Height: 62 in

## 2017-08-16 SURGERY — ARTHROPLASTY, HIP, TOTAL, ANTERIOR APPROACH
Anesthesia: General | Site: Hip | Laterality: Right

## 2017-08-16 MED ORDER — RIVAROXABAN 10 MG PO TABS
10.0000 mg | ORAL_TABLET | Freq: Every day | ORAL | Status: DC
  Administered 2017-08-17 – 2017-08-20 (×4): 10 mg via ORAL
  Filled 2017-08-16 (×4): qty 1

## 2017-08-16 MED ORDER — LACTATED RINGERS IV SOLN: INTRAVENOUS | Status: DC

## 2017-08-16 MED ORDER — SODIUM CHLORIDE 0.9 % IJ SOLN
INTRAMUSCULAR | Status: DC | PRN
  Administered 2017-08-16: 30 mL

## 2017-08-16 MED ORDER — PROPOFOL 10 MG/ML IV BOLUS
INTRAVENOUS | Status: AC
  Filled 2017-08-16: qty 20

## 2017-08-16 MED ORDER — TRANEXAMIC ACID 1000 MG/10ML IV SOLN
2000.0000 mg | Freq: Once | INTRAVENOUS | Status: AC
  Administered 2017-08-16: 2000 mg via TOPICAL
  Filled 2017-08-16: qty 20

## 2017-08-16 MED ORDER — ISOPROPYL ALCOHOL 70 % SOLN
Status: AC
  Filled 2017-08-16: qty 480

## 2017-08-16 MED ORDER — MENTHOL 3 MG MT LOZG: 1.0000 | LOZENGE | OROMUCOSAL | Status: DC | PRN

## 2017-08-16 MED ORDER — HYDROMORPHONE HCL 1 MG/ML IJ SOLN
0.2500 mg | INTRAMUSCULAR | Status: DC | PRN
  Administered 2017-08-16 (×2): 0.5 mg via INTRAVENOUS

## 2017-08-16 MED ORDER — DEXAMETHASONE SODIUM PHOSPHATE 10 MG/ML IJ SOLN
INTRAMUSCULAR | Status: DC | PRN
  Administered 2017-08-16: 10 mg via INTRAVENOUS

## 2017-08-16 MED ORDER — HYDROMORPHONE HCL 1 MG/ML IJ SOLN
INTRAMUSCULAR | Status: AC
  Filled 2017-08-16: qty 1

## 2017-08-16 MED ORDER — ACETAMINOPHEN 325 MG PO TABS: 325.0000 mg | ORAL_TABLET | ORAL | Status: DC | PRN

## 2017-08-16 MED ORDER — BUPIVACAINE-EPINEPHRINE 0.25% -1:200000 IJ SOLN
INTRAMUSCULAR | Status: DC | PRN
  Administered 2017-08-16: 30 mL

## 2017-08-16 MED ORDER — FENTANYL CITRATE (PF) 100 MCG/2ML IJ SOLN
INTRAMUSCULAR | Status: AC
  Filled 2017-08-16: qty 2

## 2017-08-16 MED ORDER — ISOPROPYL ALCOHOL 70 % SOLN
Status: DC | PRN
  Administered 2017-08-16: 1 via TOPICAL

## 2017-08-16 MED ORDER — ACETAMINOPHEN 650 MG RE SUPP: 650.0000 mg | Freq: Four times a day (QID) | RECTAL | Status: DC | PRN

## 2017-08-16 MED ORDER — KETOROLAC TROMETHAMINE 30 MG/ML IJ SOLN
INTRAMUSCULAR | Status: DC | PRN
  Administered 2017-08-16: 30 mg

## 2017-08-16 MED ORDER — SUGAMMADEX SODIUM 200 MG/2ML IV SOLN
INTRAVENOUS | Status: AC
  Filled 2017-08-16: qty 2

## 2017-08-16 MED ORDER — LACTATED RINGERS IV SOLN
INTRAVENOUS | Status: DC | PRN
  Administered 2017-08-16: 14:00:00 via INTRAVENOUS

## 2017-08-16 MED ORDER — SODIUM CHLORIDE 0.9 % IV SOLN
INTRAVENOUS | Status: DC | PRN
  Administered 2017-08-16: 13:00:00 via INTRAVENOUS

## 2017-08-16 MED ORDER — DEXMEDETOMIDINE HCL IN NACL 200 MCG/50ML IV SOLN
INTRAVENOUS | Status: AC
  Filled 2017-08-16: qty 50

## 2017-08-16 MED ORDER — PROPOFOL 10 MG/ML IV BOLUS
INTRAVENOUS | Status: DC | PRN
  Administered 2017-08-16: 120 mg via INTRAVENOUS

## 2017-08-16 MED ORDER — SODIUM CHLORIDE 0.9 % IJ SOLN
INTRAMUSCULAR | Status: AC
  Filled 2017-08-16: qty 50

## 2017-08-16 MED ORDER — SODIUM CHLORIDE 0.9 % IV SOLN
Freq: Once | INTRAVENOUS | Status: AC
  Administered 2017-08-16: 13:00:00 via INTRAVENOUS

## 2017-08-16 MED ORDER — ACETAMINOPHEN 325 MG PO TABS
650.0000 mg | ORAL_TABLET | Freq: Four times a day (QID) | ORAL | Status: DC | PRN
  Administered 2017-08-17 – 2017-08-18 (×2): 650 mg via ORAL
  Filled 2017-08-16 (×2): qty 2

## 2017-08-16 MED ORDER — ROCURONIUM BROMIDE 50 MG/5ML IV SOSY
PREFILLED_SYRINGE | INTRAVENOUS | Status: DC | PRN
  Administered 2017-08-16 (×2): 10 mg via INTRAVENOUS
  Administered 2017-08-16: 40 mg via INTRAVENOUS

## 2017-08-16 MED ORDER — OXYCODONE HCL 5 MG/5ML PO SOLN
5.0000 mg | Freq: Once | ORAL | Status: DC | PRN
  Filled 2017-08-16: qty 5

## 2017-08-16 MED ORDER — ONDANSETRON HCL 4 MG/2ML IJ SOLN
INTRAMUSCULAR | Status: AC
  Filled 2017-08-16: qty 2

## 2017-08-16 MED ORDER — LIDOCAINE 2% (20 MG/ML) 5 ML SYRINGE
INTRAMUSCULAR | Status: DC | PRN
  Administered 2017-08-16: 70 mg via INTRAVENOUS

## 2017-08-16 MED ORDER — MEPERIDINE HCL 50 MG/ML IJ SOLN: 6.2500 mg | INTRAMUSCULAR | Status: DC | PRN

## 2017-08-16 MED ORDER — FENTANYL CITRATE (PF) 100 MCG/2ML IJ SOLN
25.0000 ug | INTRAMUSCULAR | Status: DC | PRN
  Administered 2017-08-16 (×2): 50 ug via INTRAVENOUS

## 2017-08-16 MED ORDER — ACETAMINOPHEN 160 MG/5ML PO SOLN: 325.0000 mg | ORAL | Status: DC | PRN

## 2017-08-16 MED ORDER — FENTANYL CITRATE (PF) 100 MCG/2ML IJ SOLN
INTRAMUSCULAR | Status: DC | PRN
  Administered 2017-08-16 (×6): 50 ug via INTRAVENOUS

## 2017-08-16 MED ORDER — KETOROLAC TROMETHAMINE 30 MG/ML IJ SOLN
INTRAMUSCULAR | Status: AC
  Filled 2017-08-16: qty 1

## 2017-08-16 MED ORDER — MIDAZOLAM HCL 2 MG/2ML IJ SOLN
INTRAMUSCULAR | Status: AC
  Filled 2017-08-16: qty 2

## 2017-08-16 MED ORDER — MIDAZOLAM HCL 5 MG/5ML IJ SOLN
INTRAMUSCULAR | Status: DC | PRN
  Administered 2017-08-16: 1 mg via INTRAVENOUS

## 2017-08-16 MED ORDER — ROCURONIUM BROMIDE 10 MG/ML (PF) SYRINGE
PREFILLED_SYRINGE | INTRAVENOUS | Status: AC
  Filled 2017-08-16: qty 5

## 2017-08-16 MED ORDER — METOCLOPRAMIDE HCL 5 MG PO TABS: 5.0000 mg | ORAL_TABLET | Freq: Three times a day (TID) | ORAL | Status: DC | PRN

## 2017-08-16 MED ORDER — METOCLOPRAMIDE HCL 5 MG/ML IJ SOLN: 5.0000 mg | Freq: Three times a day (TID) | INTRAMUSCULAR | Status: DC | PRN

## 2017-08-16 MED ORDER — HYDROCODONE-ACETAMINOPHEN 5-325 MG PO TABS: 1.0000 | ORAL_TABLET | Freq: Four times a day (QID) | ORAL | Status: DC | PRN

## 2017-08-16 MED ORDER — 0.9 % SODIUM CHLORIDE (POUR BTL) OPTIME
TOPICAL | Status: DC | PRN
  Administered 2017-08-16: 1000 mL

## 2017-08-16 MED ORDER — BUPIVACAINE-EPINEPHRINE 0.25% -1:200000 IJ SOLN
INTRAMUSCULAR | Status: AC
  Filled 2017-08-16: qty 1

## 2017-08-16 MED ORDER — KETOROLAC TROMETHAMINE 30 MG/ML IJ SOLN: 30.0000 mg | Freq: Once | INTRAMUSCULAR | Status: DC | PRN

## 2017-08-16 MED ORDER — MORPHINE SULFATE (PF) 4 MG/ML IV SOLN: 0.5000 mg | INTRAVENOUS | Status: DC | PRN

## 2017-08-16 MED ORDER — LIDOCAINE 2% (20 MG/ML) 5 ML SYRINGE
INTRAMUSCULAR | Status: AC
  Filled 2017-08-16: qty 5

## 2017-08-16 MED ORDER — CEFAZOLIN SODIUM-DEXTROSE 2-4 GM/100ML-% IV SOLN
2.0000 g | Freq: Four times a day (QID) | INTRAVENOUS | Status: AC
  Administered 2017-08-16 – 2017-08-17 (×2): 2 g via INTRAVENOUS
  Filled 2017-08-16 (×2): qty 100

## 2017-08-16 MED ORDER — ONDANSETRON HCL 4 MG/2ML IJ SOLN
INTRAMUSCULAR | Status: DC | PRN
  Administered 2017-08-16: 4 mg via INTRAVENOUS

## 2017-08-16 MED ORDER — PHENOL 1.4 % MT LIQD
1.0000 | OROMUCOSAL | Status: DC | PRN
  Filled 2017-08-16: qty 177

## 2017-08-16 MED ORDER — DEXAMETHASONE SODIUM PHOSPHATE 10 MG/ML IJ SOLN
INTRAMUSCULAR | Status: AC
  Filled 2017-08-16: qty 1

## 2017-08-16 MED ORDER — ONDANSETRON HCL 4 MG/2ML IJ SOLN: 4.0000 mg | Freq: Once | INTRAMUSCULAR | Status: DC | PRN

## 2017-08-16 MED ORDER — OXYCODONE HCL 5 MG PO TABS: 5.0000 mg | ORAL_TABLET | Freq: Once | ORAL | Status: DC | PRN

## 2017-08-16 MED ORDER — SODIUM CHLORIDE 0.9 % IR SOLN
Status: DC | PRN
  Administered 2017-08-16: 2000 mL

## 2017-08-16 MED ORDER — WATER FOR IRRIGATION, STERILE IR SOLN
Status: DC | PRN
  Administered 2017-08-16: 2000 mL

## 2017-08-16 SURGICAL SUPPLY — 44 items
BAG DECANTER FOR FLEXI CONT (MISCELLANEOUS) ×3 IMPLANT
BAG ZIPLOCK 12X15 (MISCELLANEOUS) IMPLANT
CAPT HIP TOTAL 2 ×3 IMPLANT
CHLORAPREP W/TINT 26ML (MISCELLANEOUS) ×3 IMPLANT
CLOTH BEACON ORANGE TIMEOUT ST (SAFETY) ×3 IMPLANT
COVER PERINEAL POST (MISCELLANEOUS) ×3 IMPLANT
COVER SURGICAL LIGHT HANDLE (MISCELLANEOUS) ×3 IMPLANT
DECANTER SPIKE VIAL GLASS SM (MISCELLANEOUS) ×3 IMPLANT
DERMABOND ADVANCED (GAUZE/BANDAGES/DRESSINGS) ×2
DERMABOND ADVANCED .7 DNX12 (GAUZE/BANDAGES/DRESSINGS) ×1 IMPLANT
DRAPE SHEET LG 3/4 BI-LAMINATE (DRAPES) ×9 IMPLANT
DRAPE STERI IOBAN 125X83 (DRAPES) ×3 IMPLANT
DRAPE U-SHAPE 47X51 STRL (DRAPES) ×6 IMPLANT
DRSG AQUACEL AG ADV 3.5X10 (GAUZE/BANDAGES/DRESSINGS) ×3 IMPLANT
ELECT PENCIL ROCKER SW 15FT (MISCELLANEOUS) ×3 IMPLANT
ELECT REM PT RETURN 15FT ADLT (MISCELLANEOUS) ×3 IMPLANT
GAUZE SPONGE 4X4 12PLY STRL (GAUZE/BANDAGES/DRESSINGS) ×3 IMPLANT
GLOVE BIO SURGEON STRL SZ8.5 (GLOVE) ×6 IMPLANT
GLOVE BIOGEL PI IND STRL 8.5 (GLOVE) ×1 IMPLANT
GLOVE BIOGEL PI INDICATOR 8.5 (GLOVE) ×2
GOWN SPEC L3 XXLG W/TWL (GOWN DISPOSABLE) ×3 IMPLANT
HANDPIECE INTERPULSE COAX TIP (DISPOSABLE) ×2
HEAD FEMORAL 32 CERAMIC (Hips) ×3 IMPLANT
HOLDER FOLEY CATH W/STRAP (MISCELLANEOUS) IMPLANT
HOOD PEEL AWAY FLYTE STAYCOOL (MISCELLANEOUS) ×9 IMPLANT
MARKER SKIN DUAL TIP RULER LAB (MISCELLANEOUS) ×3 IMPLANT
NEEDLE SPNL 18GX3.5 QUINCKE PK (NEEDLE) ×3 IMPLANT
PACK ANTERIOR HIP CUSTOM (KITS) ×3 IMPLANT
SAW OSC TIP CART 19.5X105X1.3 (SAW) ×3 IMPLANT
SEALER BIPOLAR AQUA 6.0 (INSTRUMENTS) ×3 IMPLANT
SET HNDPC FAN SPRY TIP SCT (DISPOSABLE) ×1 IMPLANT
SUT ETHIBOND NAB CT1 #1 30IN (SUTURE) ×6 IMPLANT
SUT MNCRL AB 3-0 PS2 18 (SUTURE) ×3 IMPLANT
SUT MON AB 2-0 CT1 36 (SUTURE) ×6 IMPLANT
SUT STRATAFIX PDO 1 14 VIOLET (SUTURE) ×2
SUT STRATFX PDO 1 14 VIOLET (SUTURE) ×1
SUT VIC AB 2-0 CT1 27 (SUTURE) ×2
SUT VIC AB 2-0 CT1 TAPERPNT 27 (SUTURE) ×1 IMPLANT
SUTURE STRATFX PDO 1 14 VIOLET (SUTURE) ×1 IMPLANT
SYR 50ML LL SCALE MARK (SYRINGE) IMPLANT
TRAY FOLEY W/METER SILVER 16FR (SET/KITS/TRAYS/PACK) IMPLANT
WATER STERILE IRR 1000ML POUR (IV SOLUTION) ×6 IMPLANT
YANKAUER SUCT BULB TIP 10FT TU (MISCELLANEOUS) ×3 IMPLANT
YANKAUER SUCT BULB TIP NO VENT (SUCTIONS) ×3 IMPLANT

## 2017-08-16 NOTE — Interval H&P Note (Signed)
History and Physical Interval Note:  08/16/2017 12:27 PM  Sandra [name]  has presented today for surgery, with the diagnosis of displaced right femoral neck fracture  The various methods of treatment have been discussed with the patient and family. After consideration of risks, benefits and other options for treatment, the patient has consented to  Procedure(s): TOTAL HIP ARTHROPLASTY ANTERIOR APPROACH (Right) as a surgical intervention .  The patient's history has been reviewed, patient examined, no change in status, stable for surgery.  I have reviewed the patient's chart and labs.  Questions were answered to the patient's satisfaction.    The risks, benefits, and alternatives were discussed with the patient. There are risks associated with the surgery including, but not limited to, problems with anesthesia (death), infection, instability (giving out of the joint), dislocation, differences in leg length/angulation/rotation, fracture of bones, loosening or failure of implants, hematoma (blood accumulation) which may require surgical drainage, blood clots, pulmonary embolism, nerve injury (foot drop and lateral thigh numbness), and blood vessel injury. The patient understands these risks and elects to proceed.   Sandra [name] Sandra [name]

## 2017-08-16 NOTE — Progress Notes (Signed)
  Echocardiogram 2D Echocardiogram has been performed.  Sandra [name] T Theresa Dohrman 08/16/2017, 10:24 AM

## 2017-08-16 NOTE — Op Note (Signed)
OPERATIVE REPORT  SURGEON: Rod Can, MD   ASSISTANT: Nehemiah Massed, PA-C.  PREOPERATIVE DIAGNOSIS: Displaced Right femoral neck fracture.   POSTOPERATIVE DIAGNOSIS: Displaced Right femoral neck fracture.   PROCEDURE: Right total hip arthroplasty, anterior approach.   IMPLANTS: DePuy Tri Lock stem, size 6, hi offset. DePuy Pinnacle Cup, size 50 mm. DePuy Altrx liner, size 32 by 50 mm, +4 neutral. DePuy Biolox ceramic head ball, size 32 + 5 mm. 6.5 mm cancellous screw x1.  ANESTHESIA:  General  ESTIMATED BLOOD LOSS:-400 mL    ANTIBIOTICS: 2 g Ancef.  DRAINS: None.  COMPLICATIONS: None.   CONDITION: PACU - hemodynamically stable.   BRIEF CLINICAL NOTE: Sandra [name] is a 77 y.o. female with a displaced Right femoral neck fracture. After failing conservative management, the patient was indicated for total hip arthroplasty. The risks, benefits, and alternatives to the procedure were explained, and the patient elected to proceed.  PROCEDURE IN DETAIL: Surgical site was marked by myself in the pre-op holding area. Once inside the operating room, general anesthesia was obtained. The patient was then positioned on the [place] table. All bony prominences were well padded. The hip was prepped and draped in the normal sterile surgical fashion. A time-out was called verifying side and site of surgery. The patient received IV antibiotics within 60 minutes of beginning the procedure.  The direct anterior approach to the hip was performed through the Hueter interval. Lateral femoral circumflex vessels were treated with the Auqumantys. The anterior capsule was exposed and an inverted T capsulotomy was made. The femoral neck fracture was identified.  She had a comminuted subcapital femoral neck fracture.  The fracture hematoma was evacuated.  The femoral neck cut was made to the level of the templated cut. A corkscrew was placed into the head and the head was removed.The head was  passed to the back table and was measured.  Acetabular exposure was achieved, and the pulvinar and labrum were excised. Sequential reaming of the acetabulum was then performed up to a size 49 mm reamer. A 50 mm cup was then opened and impacted into place at approximately 45 degrees of abduction and 25 degrees of anteversion due to spinal fusion and hardware.  I elected to augment the already acceptable fixation with a single 6.5 mm cancellous bone screw. The final polyethylene liner was impacted into place and acetabular osteophytes were removed.   I then gained femoral exposure taking care to protect the abductors and greater trochanter. This was performed using standard external rotation, extension, and adduction. The capsule was peeled off the inner aspect of the greater trochanter, taking care to preserve the short external rotators. A cookie cutter was used to enter the femoral canal, and then the femoral canal finder was placed. Sequential broaching was performed up to a size 6. Calcar planer was used on the femoral neck remnant. I placed a hi offset neck and a trial head ball. The hip was reduced. Leg lengths and offset were checked fluoroscopically. The hip was dislocated and trial components were removed. The final implants were placed, and the hip was reduced.  Fluoroscopy was used to confirm component position and leg lengths. At 90 degrees of external rotation and full extension, the hip was stable to an anterior directed force.  Please note that lengthening of the operative extremity was required in order to achieve appropriate soft tissue stability.  The wound was copiously irrigated with normal saline using pulse lavage. Marcaine solution was injected into the periarticular soft  tissue. The wound was closed in layers using #1 Vicryl and V-Loc for the fascia, 2-0 Vicryl for the subcutaneous fat, 2-0 Monocryl for the deep dermal layer, 3-0 running Monocryl subcuticular stitch, and  Dermabond for the skin. Once the glue was fully dried, an Aquacell Ag dressing was applied. The patient was transported to the recovery room in stable condition. Sponge, needle, and instrument counts were correct at the end of the case x2. The patient tolerated the procedure well and there were no known complications.  Please note that a surgical assistant was a medical necessity for this procedure to perform it in a safe and expeditious manner. Assistant was necessary to provide appropriate retraction of vital neurovascular structures, to prevent femoral fracture, and to allow for anatomic placement of the prosthesis.  Postoperatively, the patient will be readmitted to the hospitalist service.  She may weight-bear as tolerated with a walker.  I spoke with GI on the phone, who recommended resuming Xarelto for DVT prophylaxis.  Patient is to remain on a PPI per GI.  She is to avoid NSAIDs.  She will work with physical and occupational therapy, and we will plan for discharge home.  She will return to the office in 2 weeks for routine postoperative care.

## 2017-08-16 NOTE — Discharge Instructions (Signed)
°Dr. George Alcantar °Joint Replacement Specialist °[facility] °[address] °[place], [place] [address] °[phone] ° ° °TOTAL HIP REPLACEMENT POSTOPERATIVE DIRECTIONS ° ° ° °Hip Rehabilitation, Guidelines Following Surgery  ° °WEIGHT BEARING °Weight bearing as tolerated with assist device (walker, cane, etc) as directed, use it as long as suggested by your surgeon or therapist, typically at least 4-6 weeks. ° °The results of a hip operation are greatly improved after range of motion and muscle strengthening exercises. Follow all safety measures which are given to protect your hip. If any of these exercises cause increased pain or swelling in your joint, decrease the amount until you are comfortable again. Then slowly increase the exercises. Call your caregiver if you have problems or questions.  ° °HOME CARE INSTRUCTIONS  °Most of the following instructions are designed to prevent the dislocation of your new hip.  °Remove items at home which could result in a fall. This includes throw rugs or furniture in walking pathways.  °Continue medications as instructed at time of discharge. °· You may have some home medications which will be placed on hold until you complete the course of blood thinner medication. °· You may start showering once you are discharged home. Do not remove your dressing. °Do not put on socks or shoes without following the instructions of your caregivers.   °Sit on chairs with arms. Use the chair arms to help push yourself up when arising.  °Arrange for the use of a toilet seat elevator so you are not sitting low.  °· Walk with walker as instructed.  °You may resume a sexual relationship in one month or when given the OK by your caregiver.  °Use walker as long as suggested by your caregivers.  °You may put full weight on your legs and walk as much as is comfortable. °Avoid periods of inactivity such as sitting longer than an hour when not asleep. This helps prevent  blood clots.  °You may return to work once you are cleared by your surgeon.  °Do not drive a car for 6 weeks or until released by your surgeon.  °Do not drive while taking narcotics.  °Wear elastic stockings for two weeks following surgery during the day but you may remove then at night.  °Make sure you keep all of your appointments after your operation with all of your doctors and caregivers. You should call the office at the above phone number and make an appointment for approximately two weeks after the date of your surgery. °Please pick up a stool softener and laxative for home use as long as you are requiring pain medications. °· ICE to the affected hip every three hours for 30 minutes at a time and then as needed for pain and swelling. Continue to use ice on the hip for pain and swelling from surgery. You may notice swelling that will progress down to the foot and ankle.  This is normal after surgery.  Elevate the leg when you are not up walking on it.   °It is important for you to complete the blood thinner medication as prescribed by your doctor. °· Continue to use the breathing machine which will help keep your temperature down.  It is common for your temperature to cycle up and down following surgery, especially at night when you are not up moving around and exerting yourself.  The breathing machine keeps your lungs expanded and your temperature down. ° °RANGE OF MOTION AND STRENGTHENING EXERCISES  °These exercises are   designed to help you keep full movement of your hip joint. Follow your caregiver's or physical therapist's instructions. Perform all exercises about fifteen times, three times per day or as directed. Exercise both hips, even if you have had only one joint replacement. These exercises can be done on a training (exercise) mat, on the floor, on a table or on a bed. Use whatever works the best and is most comfortable for you. Use music or television while you are exercising so that the exercises  are a pleasant break in your day. This will make your life better with the exercises acting as a break in routine you can look forward to.  °Lying on your back, slowly slide your foot toward your buttocks, raising your knee up off the floor. Then slowly slide your foot back down until your leg is straight again.  °Lying on your back spread your legs as far apart as you can without causing discomfort.  °Lying on your side, raise your upper leg and foot straight up from the floor as far as is comfortable. Slowly lower the leg and repeat.  °Lying on your back, tighten up the muscle in the front of your thigh (quadriceps muscles). You can do this by keeping your leg straight and trying to raise your heel off the floor. This helps strengthen the largest muscle supporting your knee.  °Lying on your back, tighten up the muscles of your buttocks both with the legs straight and with the knee bent at a comfortable angle while keeping your heel on the floor.  ° °SKILLED REHAB INSTRUCTIONS: °If the patient is transferred to a skilled rehab facility following release from the hospital, a list of the current medications will be sent to the facility for the patient to continue.  When discharged from the skilled rehab facility, please have the facility set up the patient's Home Health Physical Therapy prior to being released. Also, the skilled facility will be responsible for providing the patient with their medications at time of release from the facility to include their pain medication and their blood thinner medication. If the patient is still at the rehab facility at time of the two week follow up appointment, the skilled rehab facility will also need to assist the patient in arranging follow up appointment in our office and any transportation needs. ° °MAKE SURE YOU:  °Understand these instructions.  °Will watch your condition.  °Will get help right away if you are not doing well or get worse. ° °Pick up stool softner and  laxative for home use following surgery while on pain medications. °Do not remove your dressing. °The dressing is waterproof--it is OK to take showers. °Continue to use ice for pain and swelling after surgery. °Do not use any lotions or creams on the incision until instructed by your surgeon. °Total Hip Protocol. ° ° °

## 2017-08-16 NOTE — Anesthesia Postprocedure Evaluation (Signed)
Anesthesia Post Note  Patient: Sandra [name]  Procedure(s) Performed: TOTAL HIP ARTHROPLASTY ANTERIOR APPROACH (Right Hip)     Anesthesia Post Evaluation  Last Vitals:  Vitals:   08/16/17 0653 08/16/17 1231  BP: 109/60 131/64  Pulse: 75 72  Resp: 15 18  Temp: 36.7 C 37.6 C  SpO2: 97% 95%    Last Pain:  Vitals:   08/16/17 1231  TempSrc: Oral  PainSc: 8                  Ja Pistole

## 2017-08-16 NOTE — Anesthesia Preprocedure Evaluation (Addendum)
Anesthesia Evaluation  Patient identified by MRN, date of birth, ID band Patient awake    Reviewed: Allergy & Precautions, NPO status , Patient's Chart, lab work & pertinent test results  Airway Mallampati: II  TM Distance: >3 FB Neck ROM: Full    Dental no notable dental hx.    Pulmonary neg pulmonary ROS, former smoker,    Pulmonary exam normal breath sounds clear to auscultation       Cardiovascular + DVT  negative cardio ROS Normal cardiovascular exam Rhythm:Regular Rate:Normal  ECG: SR, rate 95   Neuro/Psych negative neurological ROS  negative psych ROS   GI/Hepatic negative GI ROS, Neg liver ROS, Gastric ulcer   Endo/Other  negative endocrine ROS  Renal/GU negative Renal ROS  negative genitourinary   Musculoskeletal negative musculoskeletal ROS (+) Chronic back pain   Abdominal   Peds negative pediatric ROS (+)  Hematology negative hematology ROS (+) anemia ,   Anesthesia Other Findings displaced right femoral neck fracture  Reproductive/Obstetrics negative OB ROS                                                             Anesthesia Evaluation  Patient identified by MRN, date of birth, ID band Patient awake    Reviewed: Allergy & Precautions, NPO status , Patient's Chart, lab work & pertinent test results  Airway Mallampati: II  TM Distance: >3 FB Neck ROM: Full    Dental  (+) Teeth Intact, Dental Advisory Given, Caps   Pulmonary former smoker,    breath sounds clear to auscultation       Cardiovascular negative cardio ROS   Rhythm:Regular Rate:Bradycardia     Neuro/Psych  Headaches, negative psych ROS   GI/Hepatic negative GI ROS, Neg liver ROS,   Endo/Other  negative endocrine ROS  Renal/GU negative Renal ROS     Musculoskeletal  (+) Arthritis , Osteoarthritis,    Abdominal Normal abdominal exam  (+)   Peds  Hematology negative  hematology ROS (+)   Anesthesia Other Findings   Reproductive/Obstetrics                            Lab Results  Component Value Date   WBC 6.3 08/16/2017   HGB 10.8 (L) 08/16/2017   HCT 32.4 (L) 08/16/2017   MCV 88.8 08/16/2017   PLT 176 08/16/2017        Anesthesia Quick Evaluation  Anesthesia Physical Anesthesia Plan  ASA: III  Anesthesia Plan: General   Post-op Pain Management:    Induction: Intravenous  PONV Risk Score and Plan: 3 and Dexamethasone, Ondansetron and Treatment may vary due to age or medical condition  Airway Management Planned: Oral ETT and LMA  Additional Equipment:   Intra-op Plan:   Post-operative Plan: Extubation in OR  Informed Consent: I have reviewed the patients History and Physical, chart, labs and discussed the procedure including the risks, benefits and alternatives for the proposed anesthesia with the patient or authorized representative who has indicated his/her understanding and acceptance.   Dental advisory given  Plan Discussed with: CRNA and Anesthesiologist  Anesthesia Plan Comments:        Anesthesia Quick Evaluation

## 2017-08-16 NOTE — Care Management Important Message (Signed)
Important Message  Patient Details  Name: Sandra [name] MRN: [id number] Date of Birth: 09-29-1940   Medicare Important Message Given:  Yes    Kerin Salen 08/16/2017, 9:39 AMImportant Message  Patient Details  Name: Sandra [name] MRN: [id number] Date of Birth: 03/22/1941   Medicare Important Message Given:  Yes    Kerin Salen 08/16/2017, 9:39 AM

## 2017-08-16 NOTE — Progress Notes (Addendum)
Subjective: Hospital day - 3 Patient reports pain as mild.   Patient seen in rounds for Dr. Lyla Glassing.  Planned surgery later this afternoon. Currently NPO Patient is well, but has had some minor complaints of pain in the fractured hip, requiring pain medications Consent ordered and needs to be updated to show Dr. Lyla Glassing as the surgeon for the procedure.  Objective: Vital signs in last 24 hours: Temp:  [97.7 F (36.5 C)-99 F (37.2 C)] 98.1 F (36.7 C) (02/15 0653) Pulse Rate:  [72-87] 75 (02/15 0653) Resp:  [11-19] 15 (02/15 0653) BP: (99-125)/(40-64) 109/60 (02/15 0653) SpO2:  [93 %-100 %] 97 % (02/15 0653)  Intake/Output from previous day:  Intake/Output Summary (Last 24 hours) at 08/16/2017 0811 Last data filed at 08/16/2017 0653 Gross per 24 hour  Intake 1079.17 ml  Output 950 ml  Net 129.17 ml    Intake/Output this shift: No intake/output data recorded.  Labs: Recent Labs    08/13/17 1556 08/14/17 0544 08/14/17 1849 08/15/17 0610 08/16/17 0530  HGB 8.9* 6.5* 9.1* 8.6* 10.8*   Recent Labs    08/15/17 0610 08/16/17 0530  WBC 6.9 6.3  RBC 2.89* 3.65*  HCT 25.4* 32.4*  PLT 197 176   Recent Labs    08/14/17 0544 08/15/17 0610  NA 139 141  K 4.1 3.8  CL 109 110  CO2 21* 23  BUN 57* 39*  CREATININE 1.04* 0.97  GLUCOSE 127* 103*  CALCIUM 8.5* 8.1*   Recent Labs    08/13/17 2131  INR 1.45    EXAM General - Patient is Alert, Appropriate and Oriented Extremity - Neurovascular intact Sensation intact distally Intact pulses distally Motor Function - intact, moving foot and toes well on exam.   Past Medical History:  Diagnosis Date  . Arthritis    back   . Cataracts, bilateral   . Chronic back pain    scoliosis/degenerative disc  . Complication of anesthesia    slow to wake up and combative in April 09/in Dec 09 she did much better  . DVT (deep venous thrombosis) ([place])    mid 80's  . Headache    occasionally  . History of bronchitis  5+yrs ago  . History of MRSA infection 2009  . Osteopenia   . Urinary urgency   . Weakness    numbness and tingling in both legs    Assessment/Plan: Hospital day - 3 Principal Problem:   Hip fracture ([place]) Active Problems:   DVT (deep venous thrombosis) ([facility]) history of   Fall   Normocytic anemia  Estimated body mass index is 30.36 kg/m as calculated from the following:   Height as of this encounter: 5\' 2"  (1.575 m).   Weight as of 07/29/17: 75.3 kg (166 lb).  NPO Surgery today Update consent form to show Dr. Lyla Glassing HGB - 10.8  GI CONSULT Recommendations ------------------------ - Okay to proceed with orthopedic intervention from GI standpoint. - There is no actively bleeding at this time, but she remains at risk of GI bleed from  large gastric ulcer after starting anticoagulation. Findings and risk of recurrent GI bleed discussed with patient and patient's family. - Avoid NSAIDs - Continue IV twice a day PPI while in the hospital. Switch to by mouth twice a day PPI on discharge. - Continue twice a day PPI for 2 months. - Avoid NSAIDs - Repeat EGD in 2 months to document healing - Follow-up with me in GI clinic in 6 weeks. Otis Brace MD, Rosalita Chessman  Arlee Muslim, PA-C Orthopaedic Surgery 08/16/2017, 8:11 AM

## 2017-08-16 NOTE — Anesthesia Procedure Notes (Signed)
Procedure Name: Intubation Date/Time: 08/16/2017 1:21 PM Performed by: Montel Clock, CRNA Pre-anesthesia Checklist: Patient identified, Emergency Drugs available, Suction available, Patient being monitored and Timeout performed Patient Re-evaluated:Patient Re-evaluated prior to induction Oxygen Delivery Method: Circle system utilized Preoxygenation: Pre-oxygenation with 100% oxygen Induction Type: IV induction Ventilation: Mask ventilation without difficulty and Oral airway inserted - appropriate to patient size Laryngoscope Size: Mac and 3 Grade View: Grade II Tube type: Oral Tube size: 7.0 mm Number of attempts: 1 Airway Equipment and Method: Stylet Placement Confirmation: ETT inserted through vocal cords under direct vision,  positive ETCO2 and breath sounds checked- equal and bilateral Secured at: 21 cm Tube secured with: Tape Dental Injury: Teeth and Oropharynx as per pre-operative assessment

## 2017-08-16 NOTE — Transfer of Care (Signed)
Immediate Anesthesia Transfer of Care Note  Patient: Sandra [name]  Procedure(s) Performed: TOTAL HIP ARTHROPLASTY ANTERIOR APPROACH (Right Hip)  Patient Location: PACU  Anesthesia Type:General  Level of Consciousness: awake  Airway & Oxygen Therapy: Patient Spontanous Breathing and Patient connected to face mask oxygen  Post-op Assessment: Report given to RN and Post -op Vital signs reviewed and stable  Post vital signs: Reviewed and stable  Last Vitals:  Vitals:   08/16/17 0653 08/16/17 1231  BP: 109/60 131/64  Pulse: 75 72  Resp: 15 18  Temp: 36.7 C 37.6 C  SpO2: 97% 95%    Last Pain:  Vitals:   08/16/17 1231  TempSrc: Oral  PainSc: 8       Patients Stated Pain Goal: 4 (50/72/25 7505)  Complications: No apparent anesthesia complications

## 2017-08-16 NOTE — Progress Notes (Signed)
Patient ID: Sandra [name], female   DOB: [birth year], 77 y.o.   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 Pt reports she tripped and injured her right leg. In ED, imaging studies confirmed displaced right femoral neck fracture.   Assessment & Plan:   Principal Problem:   Hip fracture ([place]), right femoral neck - plan to take to OR today - keep NPO - analgesia as needed   Active Problems:   DVT (deep venous thrombosis) ([facility]) - back in 80's, currently on Xarelto for DVT prophylaxis from recent knee replacement - Xarelto has been on hold due to bleeding     Fall - will need PT eval post op     Normocytic anemia - has required total 4 U PRBC since admission  - EGD done, appreciate GI team assistance - EGD with large gastric ulcer in prepyloric area, no active bleeding, biopsy taken - avoid NSAID's - keep on PPI BID x 2 months, ok to switch to PO on discharge  - plan for repeat EGD in 2 months     Diarrhea  - improving     Pre renal azotemia - slight bump in Cr and likely in the setting of acute blood loss anemia - now resolved     Obesity  - Body mass index is 30.36 kg/m.  DVT prophylaxis: SCD's Code Status: Full Family Communication: pt and sister at bedside  Disposition Plan: to be determined   Consultants:   Ortho   GI  Procedures:   None  Antimicrobials:   None  Subjective: Still with pain in the RLE  Objective: Vitals:   08/15/17 1738 08/15/17 1819 08/15/17 2105 08/16/17 0653  BP: 116/63 (!) 115/57 (!) 110/52 109/60  Pulse: 77 87 80 75  Resp: 15 16 16 15   Temp: 99 F (37.2 C) 97.7 F (36.5 C) 99 F (37.2 C) 98.1 F (36.7 C)  TempSrc: Oral  Oral Oral  SpO2: 96% 96% 97%  97%  Height:        Intake/Output Summary (Last 24 hours) at 08/16/2017 0857 Last data filed at 08/16/2017 5053 Gross per 24 hour  Intake 1079.17 ml  Output 950 ml  Net 129.17 ml   There were no vitals filed for this visit.  Physical Exam  Constitutional: Appears well-developed and well-nourished. No distress.  CVS: RRR, S1/S2 +, no murmurs, no gallops, no carotid bruit.  Pulmonary: Effort and breath sounds normal, no stridor, rhonchi, wheezes, rales.  Abdominal: Soft. BS +,  no distension, tenderness, rebound or guarding.  Musculoskeletal: TTP in the right hip area   Data Reviewed: I have personally reviewed following labs and imaging studies  CBC: Recent Labs  Lab 08/13/17 1556 08/14/17 0544 08/14/17 1849 08/15/17 0610 08/16/17 0530  WBC 7.2 9.4  --  6.9 6.3  NEUTROABS 5.5  --   --   --   --   HGB 8.9* 6.5* 9.1* 8.6* 10.8*  HCT 26.8* 19.2* 27.6* 25.4* 32.4*  MCV 93.1 92.3  --  87.9 88.8  PLT 255 258  --  197 976   Basic Metabolic Panel: Recent Labs  Lab 08/13/17 1556 08/14/17 0544 08/15/17 0610 08/16/17 0530  NA 142 139 141 139  K 4.5 4.1 3.8 3.9  CL 106 109 110 107  CO2 27 21* 23 25  GLUCOSE 122* 127* 103* 105*  BUN 35* 57* 39* 21*  CREATININE 0.99 1.04* 0.97 0.98  CALCIUM 8.9 8.5* 8.1* 8.3*   Coagulation Profile: Recent Labs  Lab 08/13/17 2131  INR 1.45   Anemia Panel: Recent Labs    08/13/17 2131  VITAMINB12 619  FOLATE 21.1  FERRITIN 110  TIBC 235*  IRON 58  RETICCTPCT 3.3*   Urine analysis:    Component Value Date/Time   COLORURINE YELLOW 02/25/2015 1906   APPEARANCEUR CLOUDY (A) 02/25/2015 1906   LABSPEC 1.016 02/25/2015 1906   PHURINE 6.0 02/25/2015 1906   GLUCOSEU NEGATIVE 02/25/2015 1906   HGBUR TRACE (A) 02/25/2015 1906   BILIRUBINUR NEGATIVE 02/25/2015 1906   KETONESUR NEGATIVE 02/25/2015 1906   PROTEINUR NEGATIVE 02/25/2015 1906   UROBILINOGEN 0.2 02/25/2015 1906   NITRITE NEGATIVE 02/25/2015 1906   LEUKOCYTESUR SMALL (A)  02/25/2015 1906    Radiology Studies: Dg Lumbar Spine 2-3 Views Result Date: 08/13/2017 1. No obvious malalignment; reduced sensitivity for fracture at L4 and L5 due to severe blurring of the vertebral outlines causing poor definition of the cortex on the lateral projections. This pleura is probably due to the combination of cross-table lateral projection and motion artifact and persist despite repeat projection. 2. Chronic lucency around the T10 pedicle screws, probably from loosening or infection.   Dg Knee Complete 4 Views Right Result Date: 08/13/2017 1. No appreciable fracture. 2. Suboptimal positioning because of the hip fracture. No true lateral could be obtained. Indeterminate for knee effusion. 3. Low but stable position of the patella. 4. Total knee arthroplasty noted. Dg Hip Unilat  With Pelvis 2-3 Views Right  Result Date: 08/13/2017 1. Displaced fracture of the right femoral neck.  Scheduled Meds: . chlorhexidine  60 mL Topical Once  . gabapentin  400 mg Oral Daily  . gabapentin  800 mg Oral QHS  . pantoprazole (PROTONIX) IV  40 mg Intravenous Q12H  . povidone-iodine  2 application Topical Once  . povidone-iodine  2 application Topical Once   Continuous Infusions: . sodium chloride    . acetaminophen    .  ceFAZolin (ANCEF) IV       LOS: 3 days   Time spent: 25 minutes  25 minutes with > 50% of time discussing current diagnostic test results, clinical impression and plan of care.  Faye Ramsay, MD [facility] Pager [phone]  If 7PM-7AM, please contact night-coverage [email or web address] Password [facility] 08/16/2017, 8:57 AM

## 2017-08-17 LAB — BASIC METABOLIC PANEL
Anion gap: 8 (ref 5–15)
BUN: 17 mg/dL (ref 6–20)
CHLORIDE: 106 mmol/L (ref 101–111)
CO2: 25 mmol/L (ref 22–32)
CREATININE: 0.86 mg/dL (ref 0.44–1.00)
Calcium: 8.2 mg/dL — ABNORMAL LOW (ref 8.9–10.3)
GFR calc non Af Amer: >60 mL/min (ref >60)
Glucose, Bld: 137 mg/dL — ABNORMAL HIGH (ref 65–99)
POTASSIUM: 4.6 mmol/L (ref 3.5–5.1)
SODIUM: 139 mmol/L (ref 135–145)

## 2017-08-17 LAB — CBC
HCT: 30.2 % — ABNORMAL LOW (ref 36.0–46.0)
HEMOGLOBIN: 9.7 g/dL — AB (ref 12.0–15.0)
MCH: 28.7 pg (ref 26.0–34.0)
MCHC: 32.1 g/dL (ref 30.0–36.0)
MCV: 89.3 fL (ref 78.0–100.0)
Platelets: 197 10*3/uL (ref 150–400)
RBC: 3.38 MIL/uL — AB (ref 3.87–5.11)
RDW: 14.8 % (ref 11.5–15.5)
WBC: 7.8 10*3/uL (ref 4.0–10.5)

## 2017-08-17 MED ORDER — HYDROCODONE-ACETAMINOPHEN 5-325 MG PO TABS
1.0000 | ORAL_TABLET | Freq: Four times a day (QID) | ORAL | Status: DC | PRN
  Administered 2017-08-18 – 2017-08-20 (×2): 2 via ORAL
  Filled 2017-08-17 (×3): qty 2

## 2017-08-17 MED ORDER — HYDROMORPHONE HCL 1 MG/ML IJ SOLN: 1.0000 mg | INTRAMUSCULAR | Status: DC | PRN

## 2017-08-17 MED ORDER — OXYCODONE HCL 5 MG PO TABS
10.0000 mg | ORAL_TABLET | ORAL | Status: DC | PRN
  Administered 2017-08-17 – 2017-08-18 (×3): 20 mg via ORAL
  Administered 2017-08-18 (×2): 10 mg via ORAL
  Administered 2017-08-18: 22:00:00 20 mg via ORAL
  Administered 2017-08-18: 10 mg via ORAL
  Administered 2017-08-19 – 2017-08-20 (×9): 20 mg via ORAL
  Filled 2017-08-17 (×9): qty 4
  Filled 2017-08-17: qty 2
  Filled 2017-08-17 (×2): qty 4
  Filled 2017-08-17: qty 2
  Filled 2017-08-17 (×3): qty 4

## 2017-08-17 NOTE — Progress Notes (Addendum)
Patient ID: Sandra [name], female   DOB: Nov 12, 1940, 77 y.o.   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 Pt reports she tripped and injured her right leg. In ED, imaging studies confirmed displaced right femoral neck fracture.   Assessment & Plan:   Principal Problem:   Hip fracture ([place]), right femoral neck - s/p hip repair, post op day #1 - pt doing well this AM, reports pain as mild but controlled on current analgesia  - PT eval done, HH PT recommended  - Xarelto for DVT prophylaxis   Active Problems:   DVT (deep venous thrombosis) ([place]) - back in 80's, currently on Xarelto for DVT prophylaxis from recent knee replacement - resume Xarelto for DVT prophylaxis     Fall - HH PT recommended     Normocytic anemia - has required total 4 U PRBC since admission  - EGD done, appreciate GI team assistance - EGD with large gastric ulcer in prepyloric area, no active bleeding, biopsy taken - avoid NSAID's - keep on PPI BID x 2 months, ok to switch to PO on discharge  - plan for repeat EGD in 2 months   - drop in Hg noted this AM, could be post op related, will monitor for now  - CBC in AM    Diarrhea  - improving     Pre renal azotemia - resolved - BMP in AM    Obesity  - Body mass index is 30.36 kg/m.  DVT prophylaxis: SCD's Code Status: Full Family Communication: pt and sister and bedside  Disposition Plan: to be determined   Consultants:   Ortho   GI  Procedures:   Right hip repair 02/15 -->  Antimicrobials:   None  Subjective: Pt reports pain is better controlled, able to sit in bed.   Objective: Vitals:   08/16/17 2031 08/17/17 0149 08/17/17 0441 08/17/17 1007  BP: 95/61 (!)  102/50 113/61 106/70  Pulse: 77 62 65 71  Resp: 13 13 16    Temp: 98 F (36.7 C) 97.8 F (36.6 C) 98.1 F (36.7 C) 98 F (36.7 C)  TempSrc: Oral Oral Oral Oral  SpO2: 100% 99% 99% 99%  Weight:      Height:        Intake/Output Summary (Last 24 hours) at 08/17/2017 1603 Last data filed at 08/17/2017 1059 Gross per 24 hour  Intake 1120 ml  Output 1125 ml  Net -5 ml   Filed Weights   08/16/17 1231  Weight: 73.9 kg (163 lb)   Physical Exam  Constitutional: Appears well-developed and well-nourished. No distress.  CVS: RRR, S1/S2 +, no murmurs, no gallops, no carotid bruit.  Pulmonary: Effort and breath sounds normal, no stridor, rhonchi, wheezes, rales.  Abdominal: Soft. BS +,  no distension, tenderness, rebound or guarding.  Musculoskeletal: Normal range of motion. No edema and no tenderness.   Data Reviewed: I have personally reviewed following labs and imaging studies  CBC: Recent Labs  Lab 08/13/17 1556 08/14/17 0544 08/14/17 1849 08/15/17 0610 08/16/17 0530 08/17/17 0522  WBC 7.2 9.4  --  6.9 6.3 7.8  NEUTROABS 5.5  --   --   --   --   --  HGB 8.9* 6.5* 9.1* 8.6* 10.8* 9.7*  HCT 26.8* 19.2* 27.6* 25.4* 32.4* 30.2*  MCV 93.1 92.3  --  87.9 88.8 89.3  PLT 255 258  --  197 176 196   Basic Metabolic Panel: Recent Labs  Lab 08/13/17 1556 08/14/17 0544 08/15/17 0610 08/16/17 0530 08/17/17 0522  NA 142 139 141 139 139  K 4.5 4.1 3.8 3.9 4.6  CL 106 109 110 107 106  CO2 27 21* 23 25 25   GLUCOSE 122* 127* 103* 105* 137*  BUN 35* 57* 39* 21* 17  CREATININE 0.99 1.04* 0.97 0.98 0.86  CALCIUM 8.9 8.5* 8.1* 8.3* 8.2*   Coagulation Profile: Recent Labs  Lab 08/13/17 2131  INR 1.45   Urine analysis:    Component Value Date/Time   COLORURINE YELLOW 02/25/2015 1906   APPEARANCEUR CLOUDY (A) 02/25/2015 1906   LABSPEC 1.016 02/25/2015 1906   PHURINE 6.0 02/25/2015 1906   GLUCOSEU NEGATIVE 02/25/2015 1906   HGBUR TRACE (A) 02/25/2015 1906   BILIRUBINUR  NEGATIVE 02/25/2015 1906   KETONESUR NEGATIVE 02/25/2015 1906   PROTEINUR NEGATIVE 02/25/2015 1906   UROBILINOGEN 0.2 02/25/2015 1906   NITRITE NEGATIVE 02/25/2015 1906   LEUKOCYTESUR SMALL (A) 02/25/2015 1906    Radiology Studies: Dg Lumbar Spine 2-3 Views Result Date: 08/13/2017 1. No obvious malalignment; reduced sensitivity for fracture at L4 and L5 due to severe blurring of the vertebral outlines causing poor definition of the cortex on the lateral projections. This pleura is probably due to the combination of cross-table lateral projection and motion artifact and persist despite repeat projection. 2. Chronic lucency around the T10 pedicle screws, probably from loosening or infection.   Dg Knee Complete 4 Views Right Result Date: 08/13/2017 1. No appreciable fracture. 2. Suboptimal positioning because of the hip fracture. No true lateral could be obtained. Indeterminate for knee effusion. 3. Low but stable position of the patella. 4. Total knee arthroplasty noted. Dg Hip Unilat  With Pelvis 2-3 Views Right  Result Date: 08/13/2017 1. Displaced fracture of the right femoral neck.  Scheduled Meds: . gabapentin  400 mg Oral Daily  . gabapentin  800 mg Oral QHS  . pantoprazole (PROTONIX) IV  40 mg Intravenous Q12H  . rivaroxaban  10 mg Oral Daily   Continuous Infusions:   LOS: 4 days   Time spent: 25 minutes  25 minutes with > 50% of time discussing current diagnostic test results, clinical impression and plan of care.  Faye Ramsay, MD [facility] Pager [phone]  If 7PM-7AM, please contact night-coverage [email or web address] Password [facility] 08/17/2017, 4:03 PM

## 2017-08-17 NOTE — Progress Notes (Signed)
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 Pt with R femoral neck fracture and underwent R DATHA on 08/16/17    PT Comments    Worked on there ex in bed. Patient complained of right groin pain when bed flattened and coul not tolerate flatter position . Continue PT.   Follow Up Recommendations  Home health PT     Equipment Recommendations  None recommended by PT    Recommendations for Other Services OT consult     Precautions / Restrictions Precautions Precautions: Fall;Knee Restrictions Weight Bearing Restrictions: No RLE Weight Bearing: Weight bearing as tolerated    Mobility   Ambulation/Gait                 Stairs            Wheelchair Mobility    Modified Rankin (Stroke Patients Only)       Balance                             Cognition Arousal/Alertness: Awake/alert Behavior During Therapy: WFL for tasks assessed/performed Overall Cognitive Status: Within Functional Limits for tasks assessed                                        Exercises Total Joint Exercises Ankle Circles/Pumps: AROM;Both;10 reps;Supine Quad Sets: AROM;Both;Supine;10 reps Short Arc Quad: AROM;Right;10 reps Heel Slides: AAROM;Right;10 reps Hip ABduction/ADduction: AROM;Right;10 reps    General Comments        Pertinent Vitals/Pain Pain Score: 5  Pain Location: right groin when bed flattened Pain Descriptors / Indicators: Aching;Discomfort;Grimacing;Moaning Pain Intervention(s): Monitored during session;Premedicated before session;Ice applied    Home Living Family/patient expects to be discharged to:: Private residence Living Arrangements: Spouse/significant other;Other relatives Available Help at Discharge: Family;Available 24  hours/day Type of Home: House Home Access: Stairs to enter Entrance Stairs-Rails: Right;Left;Can reach both Home Layout: One level Home Equipment: Environmental consultant - 2 wheels;Cane - single point;Bedside commode Additional Comments: Sister will be assisting. Information taken from recent admission.    Prior Function Level of Independence: Needs assistance  Gait / Transfers Assistance Needed: had progressed to a cane ADL's / Homemaking Assistance Needed: spouse and sister      PT Goals (current goals can now be found in the care plan section) Acute Rehab PT Goals Patient Stated Goal: return to independence.  PT Goal Formulation: With patient/family Time For Goal Achievement: 08/24/17 Potential to Achieve Goals: Good Progress towards PT goals: Progressing toward goals    Frequency    Min 6X/week      PT Plan Current plan remains appropriate    Co-evaluation PT/OT/SLP Co-Evaluation/Treatment: Yes Reason for Co-Treatment: For patient/therapist safety PT goals addressed during session: Mobility/safety with mobility OT goals addressed during session: ADL's and self-care      AM-PAC PT "6 Clicks" Daily Activity  Outcome Measure  Difficulty turning over in bed (including adjusting bedclothes, sheets and blankets)?: Unable Difficulty moving from lying on back to sitting on the side of the bed? : Unable Difficulty sitting down on and standing up from a chair with arms (e.g., wheelchair, bedside commode, etc,.)?: Unable Help needed moving to and from  a bed to chair (including a wheelchair)?: Total Help needed walking in hospital room?: Total Help needed climbing 3-5 steps with a railing? : Total 6 Click Score: 6    End of Session Equipment Utilized During Treatment: Gait belt Activity Tolerance: Patient tolerated treatment well Patient left: in bed;with call bell/phone within reach;with family/visitor present Nurse Communication: Mobility status PT Visit Diagnosis: Difficulty in  walking, not elsewhere classified (R26.2);Pain Pain - Right/Left: Right Pain - part of body: Hip     Time: 6015-6153 PT Time Calculation (min) (ACUTE ONLY): 43 min  Charges:  $Therapeutic Exercise: 23-37 mins  $Self Care/Home Management: 2023/03/09                    G Codes:         [name] 08/17/2017, 4:12 PM

## 2017-08-17 NOTE — Progress Notes (Signed)
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 Pt with R femoral neck fracture and underwent R DATHA on 08/16/17    PT Comments    The patient requires 2 assist for standing from the recliner. Assisted back into bed. Continue PT.  Follow Up Recommendations  Home health PT     Equipment Recommendations  None recommended by PT    Recommendations for Other Services       Precautions / Restrictions Precautions Precautions: Fall;Knee Restrictions Weight Bearing Restrictions: No RLE Weight Bearing: Weight bearing as tolerated    Mobility  Bed Mobility Overal bed mobility: Needs Assistance Bed Mobility: Sit to Supine     Supine to sit: Mod assist Sit to supine: Mod assist   General bed mobility comments: increased time, assistance with right  leg onto bed.  Transfers Overall transfer level: Needs assistance Equipment used: Rolling walker (2 wheeled) Transfers: Sit to/from [facility] Sit to Stand: +2 physical assistance;+2 safety/equipment;Mod assist;From elevated surface Stand pivot transfers: Mod assist       General transfer comment: Cues for hand placement and LE management. Increased bed height. with extra time, patient able to  take small steps  forward and turn and bvack up to bed. some assistance to advance right leg  Ambulation/Gait                 Stairs            Wheelchair Mobility    Modified Rankin (Stroke Patients Only)       Balance Overall balance assessment: Needs assistance Sitting-balance support: Feet supported Sitting balance-Leahy Scale: Fair     Standing balance support: Bilateral upper extremity supported;During functional activity Standing balance-Leahy Scale: Poor Standing balance comment: relies on  UE                            Cognition Arousal/Alertness: Awake/alert Behavior During Therapy: WFL for tasks assessed/performed Overall Cognitive Status: Within Functional Limits for tasks assessed                                        Exercises      General Comments        Pertinent Vitals/Pain Pain Score: 7  Pain Location: hip with weight bearing Pain Descriptors / Indicators: Aching;Discomfort;Grimacing;Moaning Pain Intervention(s): Monitored during session;Premedicated before session;Ice applied    Home Living Family/patient expects to be discharged to:: Private residence Living Arrangements: Spouse/significant other;Other relatives Available Help at Discharge: Family;Available 24 hours/day Type of Home: House Home Access: Stairs to enter Entrance Stairs-Rails: Right;Left;Can reach both Home Layout: One level Home Equipment: Environmental consultant - 2 wheels;Cane - single point;Bedside commode Additional Comments: Sister will be assisting. Information taken from recent admission.    Prior Function Level of Independence: Needs assistance  Gait / Transfers Assistance Needed: had progressed to a cane ADL's / Homemaking Assistance Needed: spouse and sister      PT Goals (current goals can now be found in the care plan section) Acute Rehab PT Goals Patient Stated Goal: return to independence.  PT Goal Formulation: With patient/family Time For Goal Achievement: 08/24/17 Potential to Achieve Goals: Good Progress  towards PT goals: Progressing toward goals    Frequency    Min 6X/week      PT Plan Current plan remains appropriate    Co-evaluation PT/OT/SLP Co-Evaluation/Treatment: Yes Reason for Co-Treatment: For patient/therapist safety PT goals addressed during session: Mobility/safety with mobility OT goals addressed during session: ADL's and self-care      AM-PAC PT "6 Clicks" Daily Activity  Outcome Measure  Difficulty turning over in bed  (including adjusting bedclothes, sheets and blankets)?: Unable Difficulty moving from lying on back to sitting on the side of the bed? : Unable Difficulty sitting down on and standing up from a chair with arms (e.g., wheelchair, bedside commode, etc,.)?: Unable Help needed moving to and from a bed to chair (including a wheelchair)?: Total Help needed walking in hospital room?: Total Help needed climbing 3-5 steps with a railing? : Total 6 Click Score: 6    End of Session Equipment Utilized During Treatment: Gait belt Activity Tolerance: Patient tolerated treatment well Patient left: in bed;with call bell/phone within reach;with family/visitor present Nurse Communication: Mobility status PT Visit Diagnosis: Difficulty in walking, not elsewhere classified (R26.2);Pain Pain - Right/Left: Right Pain - part of body: Hip     Time: 1131-1159 PT Time Calculation (min) (ACUTE ONLY): 28 min  Charges:  $Therapeutic Activity: 23-37 mins                    G CodesTresa Endo PT [phone] }   [name] 08/17/2017, 2:51 PM

## 2017-08-17 NOTE — Evaluation (Signed)
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 Pt with R femoral neck fracture and underwent R DATHA on 08/13/17   Clinical Impression   Pt currently requiring +2 max assist for bed mobility and +2 mod assist for partial pivot around to recliner with recliner brought up behind her. She states pain in her R knee bothers her more than her hip on this visit. She may need an extra day or couple of days to achieve goals to safely d/c to home with family assisting. Will continue to follow to progress ADL independence. IF pt unable to progress to a level that family can safely manage, may need to consider other d/c options.     Follow Up Recommendations  Supervision/Assistance - 24 hour;Home health OT    Equipment Recommendations  None recommended by OT    Recommendations for Other Services       Precautions / Restrictions Precautions Precautions: Fall;Knee Restrictions Weight Bearing Restrictions: No RLE Weight Bearing: Weight bearing as tolerated      Mobility Bed Mobility Overal bed mobility: Needs Assistance Bed Mobility: Supine to Sit     Supine to sit: HOB elevated;+2 for physical assistance;+2 for safety/equipment;Max assist     General bed mobility comments: Increased time and cues for how to self assist. Heavy use of pad to scoot hips around to EOB. Assist for R LE over to EOB.   Transfers Overall transfer level: Needs assistance Equipment used: Rolling walker (2 wheeled) Transfers: Sit to/from Stand Sit to Stand: +2 physical assistance;+2 safety/equipment;Mod assist;From elevated surface         General transfer comment: Cues for hand placement and LE management. Increased bed height.     Balance                                            ADL either performed or assessed with clinical judgement   ADL Overall ADL's : Needs assistance/impaired Eating/Feeding: Independent;Sitting   Grooming: Wash/dry hands;Set up;Sitting   Upper Body Bathing: Set up;Sitting   Lower Body Bathing: +2 for physical assistance;+2 for safety/equipment;Total assistance;Sit to/from stand   Upper Body Dressing : Minimal assistance;Sitting   Lower Body Dressing: +2 for physical assistance;+2 for safety/equipment;Total assistance;Sit to/from stand   Toilet Transfer: +2 for physical assistance;+2 for safety/equipment;Moderate assistance;Stand-pivot;RW Toilet Transfer Details (indicate cue type and reason): partial pivot and chair brought up behind her. Toileting- Clothing Manipulation and Hygiene: +2 for safety/equipment;+2 for physical assistance;Total assistance;Sit to/from stand         General ADL Comments: Pt with recent knee surgery and reporting knee pain bothering her more than her hip during session. Used the slide on shoes she had in her room as pt states she can not use the gripper socks only. Currently +2 for partial pivot to recliner with needing recliner brought up behind her to sit.      Vision Patient Visual Report: No change from baseline       Perception     Praxis      Pertinent Vitals/Pain Pain Assessment: 0-10 Pain Score: 6  Pain Location: R knee (states knee hurting more than hip) Pain Descriptors / Indicators: Aching;Burning Pain Intervention(s): Monitored  during session;Ice applied     Hand Dominance Right   Extremity/Trunk Assessment Upper Extremity Assessment Upper Extremity Assessment: Overall WFL for tasks assessed           Communication Communication Communication: No difficulties   Cognition Arousal/Alertness: Awake/alert Behavior During Therapy: WFL for tasks assessed/performed Overall Cognitive Status: Within Functional Limits for tasks assessed                                      General Comments       Exercises     Shoulder Instructions      Home Living Family/patient expects to be discharged to:: Private residence Living Arrangements: Spouse/significant other Available Help at Discharge: Family Type of Home: House Home Access: Stairs to enter Technical brewer of Steps: 3 Entrance Stairs-Rails: Right;Left;Can reach both Home Layout: One level     Bathroom Shower/Tub: Occupational psychologist: Handicapped height     Home Equipment: Environmental consultant - 2 wheels;Cane - single point;Bedside commode   Additional Comments: Sister will be assisting. Information taken from recent admission.      Prior Functioning/Environment Level of Independence: Independent                 OT Problem List: Decreased strength;Decreased knowledge of use of DME or AE;Pain      OT Treatment/Interventions: Self-care/ADL training;DME and/or AE instruction;Therapeutic activities;Patient/family education    OT Goals(Current goals can be found in the care plan section) Acute Rehab OT Goals Patient Stated Goal: return to independence.  OT Goal Formulation: With patient Time For Goal Achievement: 08/31/17 Potential to Achieve Goals: Good  OT Frequency: Min 2X/week   Barriers to D/C:            Co-evaluation PT/OT/SLP Co-Evaluation/Treatment: Yes Reason for Co-Treatment: For patient/therapist safety   OT goals addressed during session: ADL's and self-care;Proper use of Adaptive equipment and DME      AM-PAC PT "6 Clicks" Daily Activity     Outcome Measure Help from another person eating meals?: None Help from another person taking care of personal grooming?: A Little Help from another person toileting, which includes using toliet, bedpan, or urinal?: A Lot Help from another person bathing (including washing, rinsing, drying)?: A Lot Help from another person to put on and taking off regular upper body clothing?: A Little Help from another  person to put on and taking off regular lower body clothing?: A Lot 6 Click Score: 16   End of Session Equipment Utilized During Treatment: Gait belt  Activity Tolerance: Patient limited by pain Patient left: in chair;with call bell/phone within reach  OT Visit Diagnosis: Unsteadiness on feet (R26.81);Muscle weakness (generalized) (M62.81);Pain Pain - Right/Left: Right Pain - part of body: Knee;Hip                Time: 0924-1000 OT Time Calculation (min): 36 min Charges:  OT General Charges $OT Visit: 1 Visit OT Evaluation $OT Eval Moderate Complexity: 1 Mod G-Codes:       Jae Dire Signa Cheek 08/17/2017, 1:23 PM

## 2017-08-17 NOTE — Progress Notes (Signed)
    Subjective:  Patient reports pain as mild to moderate.  Denies N/V/CP/SOB. No c/o.  Objective:   VITALS:   Vitals:   08/16/17 1935 08/16/17 2031 08/17/17 0149 08/17/17 0441  BP: (!) 142/58 95/61 (!) 102/50 113/61  Pulse: 74 77 62 65  Resp: 15 13 13 16   Temp: 98 F (36.7 C) 98 F (36.7 C) 97.8 F (36.6 C) 98.1 F (36.7 C)  TempSrc:  Oral Oral Oral  SpO2: 100% 100% 99% 99%  Weight:      Height:        NAD ABD soft Sensation intact distally Intact pulses distally Dorsiflexion/Plantar flexion intact Incision: dressing C/D/I Compartment soft   Lab Results  Component Value Date   WBC 7.8 08/17/2017   HGB 9.7 (L) 08/17/2017   HCT 30.2 (L) 08/17/2017   MCV 89.3 08/17/2017   PLT 197 08/17/2017   BMET    Component Value Date/Time   NA 139 08/17/2017 0522   NA 141 10/16/2014   K 4.6 08/17/2017 0522   CL 106 08/17/2017 0522   CO2 25 08/17/2017 0522   GLUCOSE 137 (H) 08/17/2017 0522   BUN 17 08/17/2017 0522   BUN 12 10/16/2014   CREATININE 0.86 08/17/2017 0522   CALCIUM 8.2 (L) 08/17/2017 0522   GFRNONAA >60 08/17/2017 0522   GFRAA >60 08/17/2017 0522     Assessment/Plan: 1 Day Post-Op   Principal Problem:   Hip fracture (HCC) Active Problems:   DVT (deep venous thrombosis) (HCC) history of   Fall   Normocytic anemia   Displaced fracture of right femoral neck (HCC)   WBAT with walker DVT ppx: xarelto per GI, SCDs, TEDS PO pain control PT/OT Dispo: D/C home with HEP vs HHPT depending on progress   Hilton Cork Harless Molinari 08/17/2017, 8:43 AM   Rod Can, MD Cell [phone]

## 2017-08-17 NOTE — Evaluation (Signed)
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 Pt with R femoral neck fracture and underwent R DATHA on 08/16/17  Clinical Impression  The patient requires 2 assist for bed mobility and for transfers to recliner with much extra effort and time. Patient may require extra days  In hospital to achieve a level of function to Dc home. If unable to achieve goals for home, may need to consider other options. Pt admitted with above diagnosis. Pt currently with functional limitations due to the deficits listed below (see PT Problem List).  Pt will benefit from skilled PT to increase their independence and safety with mobility to allow discharge to the venue listed below.       Follow Up Recommendations Home health PT    Equipment Recommendations  None recommended by PT    Recommendations for Other Services       Precautions / Restrictions Precautions Precautions: Fall;Knee Restrictions Weight Bearing Restrictions: No RLE Weight Bearing: Weight bearing as tolerated      Mobility  Bed Mobility Overal bed mobility: Needs Assistance Bed Mobility: Supine to Sit     Supine to sit: HOB elevated;+2 for physical assistance;+2 for safety/equipment;Max assist Sit to supine: Min guard;HOB elevated   General bed mobility comments: Increased time and cues for how to self assist. Heavy use of pad to scoot hips around to EOB. Assist for R LE over to EOB.   Transfers Overall transfer level: Needs assistance Equipment used: Rolling walker (2 wheeled) Transfers: Sit to/from [facility] Sit to Stand: +2 physical assistance;+2 safety/equipment;Mod assist;From elevated surface         General transfer comment: Cues for hand placement and LE management. Increased bed height. with  extra time, patient able to  take small steps  forward and turn and bvack up to recliner. some assistance to advance right leg  Ambulation/Gait                Stairs            Wheelchair Mobility    Modified Rankin (Stroke Patients Only)       Balance Overall balance assessment: Needs assistance Sitting-balance support: Feet supported Sitting balance-Leahy Scale: Fair     Standing balance support: Bilateral upper extremity supported;During functional activity Standing balance-Leahy Scale: Poor Standing balance comment: relies on UE                             Pertinent Vitals/Pain Pain Location: R knee (states knee hurting more than hip) Pain Descriptors / Indicators: Aching;Burning Pain Intervention(s): Monitored during session;Premedicated before session;Relaxation;Ice applied;Repositioned;Limited activity within patient's tolerance    Home Living Family/patient expects to be discharged to:: Private residence Living Arrangements: Spouse/significant other;Other relatives Available Help at Discharge: Family;Available 24 hours/day Type of Home: House Home Access: Stairs to enter Entrance Stairs-Rails: Right;Left;Can reach both Entrance Stairs-Number of Steps: 3 Home Layout: One level Home Equipment: Walker - 2 wheels;Cane - single point;Bedside commode Additional Comments: Sister will be assisting. Information taken from recent admission.    Prior Function Level of Independence: Needs assistance   Gait / Transfers Assistance Needed: had progressed to a cane  ADL's / Homemaking Assistance Needed: spouse and sister  Hand Dominance   Dominant Hand: Right    Extremity/Trunk Assessment   Upper Extremity Assessment Upper Extremity Assessment: Defer to OT evaluation    Lower Extremity Assessment Lower Extremity Assessment: RLE deficits/detail RLE Deficits / Details: requires assistance to move right lehg to bed edge        Communication   Communication: No difficulties  Cognition Arousal/Alertness: Awake/alert Behavior During Therapy: WFL for tasks assessed/performed Overall Cognitive Status: Within Functional Limits for tasks assessed                                        General Comments      Exercises     Assessment/Plan    PT Assessment Patient needs continued PT services  PT Problem List Decreased strength;Decreased range of motion;Decreased activity tolerance;Decreased mobility;Pain;Decreased knowledge of use of DME       PT Treatment Interventions DME instruction;Gait training;Stair training;Functional mobility training;Therapeutic activities;Therapeutic exercise    PT Goals (Current goals can be found in the Care Plan section)  Acute Rehab PT Goals Patient Stated Goal: return to independence.  PT Goal Formulation: With patient/family Time For Goal Achievement: 08/24/17 Potential to Achieve Goals: Good    Frequency Min 6X/week   Barriers to discharge        Co-evaluation PT/OT/SLP Co-Evaluation/Treatment: Yes Reason for Co-Treatment: For patient/therapist safety PT goals addressed during session: Mobility/safety with mobility OT goals addressed during session: ADL's and self-care       AM-PAC PT "6 Clicks" Daily Activity  Outcome Measure Difficulty turning over in bed (including adjusting bedclothes, sheets and blankets)?: Unable Difficulty moving from lying on back to sitting on the side of the bed? : Unable Difficulty sitting down on and standing up from a chair with arms (e.g., wheelchair, bedside commode, etc,.)?: Unable Help needed moving to and from a bed to chair (including a wheelchair)?: Total Help needed walking in hospital room?: Total Help needed climbing 3-5 steps with a railing? : Total 6 Click Score: 6    End of Session Equipment Utilized During Treatment: Gait belt Activity Tolerance: Patient limited by pain Patient left: in chair;with  call bell/phone within reach;with family/visitor present Nurse Communication: Mobility status PT Visit Diagnosis: Difficulty in walking, not elsewhere classified (R26.2);Pain Pain - part of body: Knee    Time: 0924-1000 PT Time Calculation (min) (ACUTE ONLY): 36 min   Charges:   PT Evaluation $PT Eval Moderate Complexity: 1 Mod     PT G CodesTresa Endo PT [phone]   [name] 08/17/2017, 2:35 PM

## 2017-08-18 LAB — CBC
HEMATOCRIT: 25.4 % — AB (ref 36.0–46.0)
HEMOGLOBIN: 8.3 g/dL — AB (ref 12.0–15.0)
MCH: 29.5 pg (ref 26.0–34.0)
MCHC: 32.7 g/dL (ref 30.0–36.0)
MCV: 90.4 fL (ref 78.0–100.0)
Platelets: 170 10*3/uL (ref 150–400)
RBC: 2.81 MIL/uL — ABNORMAL LOW (ref 3.87–5.11)
RDW: 15.4 % (ref 11.5–15.5)
WBC: 6 10*3/uL (ref 4.0–10.5)

## 2017-08-18 LAB — BASIC METABOLIC PANEL
ANION GAP: 4 — AB (ref 5–15)
BUN: 16 mg/dL (ref 6–20)
CALCIUM: 7.9 mg/dL — AB (ref 8.9–10.3)
CHLORIDE: 111 mmol/L (ref 101–111)
CO2: 24 mmol/L (ref 22–32)
Creatinine, Ser: 0.89 mg/dL (ref 0.44–1.00)
GFR calc non Af Amer: >60 mL/min (ref >60)
GLUCOSE: 96 mg/dL (ref 65–99)
POTASSIUM: 3.9 mmol/L (ref 3.5–5.1)
Sodium: 139 mmol/L (ref 135–145)

## 2017-08-18 LAB — CBC WITH DIFFERENTIAL/PLATELET
BASOS PCT: 0 %
Basophils Absolute: 0 10*3/uL (ref 0.0–0.1)
Eosinophils Absolute: 0.4 10*3/uL (ref 0.0–0.7)
Eosinophils Relative: 6 %
HEMATOCRIT: 27.8 % — AB (ref 36.0–46.0)
Hemoglobin: 9 g/dL — ABNORMAL LOW (ref 12.0–15.0)
LYMPHS ABS: 1.6 10*3/uL (ref 0.7–4.0)
LYMPHS PCT: 24 %
MCH: 29.6 pg (ref 26.0–34.0)
MCHC: 32.4 g/dL (ref 30.0–36.0)
MCV: 91.4 fL (ref 78.0–100.0)
MONO ABS: 0.7 10*3/uL (ref 0.1–1.0)
MONOS PCT: 10 %
NEUTROS ABS: 4 10*3/uL (ref 1.7–7.7)
NEUTROS PCT: 60 %
Platelets: 210 10*3/uL (ref 150–400)
RBC: 3.04 MIL/uL — ABNORMAL LOW (ref 3.87–5.11)
RDW: 15.7 % — AB (ref 11.5–15.5)
WBC: 6.7 10*3/uL (ref 4.0–10.5)

## 2017-08-18 MED ORDER — PANTOPRAZOLE SODIUM 40 MG PO TBEC: 40.0000 mg | DELAYED_RELEASE_TABLET | Freq: Two times a day (BID) | ORAL | 0 refills | Status: DC

## 2017-08-18 MED ORDER — POLYETHYLENE GLYCOL 3350 17 G PO PACK
17.0000 g | PACK | Freq: Every day | ORAL | Status: DC
  Administered 2017-08-19: 11:00:00 17 g via ORAL
  Filled 2017-08-18 (×2): qty 1

## 2017-08-18 MED ORDER — HYDROCODONE-ACETAMINOPHEN 5-325 MG PO TABS: 1.0000 | ORAL_TABLET | Freq: Four times a day (QID) | ORAL | 0 refills | Status: DC | PRN

## 2017-08-18 NOTE — Progress Notes (Signed)
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 Pt with R femoral neck fracture and underwent R DATHA on 08/16/17    PT Comments    Worked on right knee  ROM this visit. Knee flexion at 10-80 degrees. Patient experienced sudden onset of sharp groin pain on the right which lasted about 30 seconds. . Continue with attempts to ambulate next visit. .  Follow Up Recommendations  Home health PT     Equipment Recommendations  None recommended by PT    Recommendations for Other Services       Precautions / Restrictions Precautions Precautions: Fall;Knee Restrictions RLE Weight Bearing: Weight bearing as tolerated    Mobility   Stairs            Wheelchair Mobility    Modified Rankin (Stroke Patients Only)       Balance                                            Cognition Arousal/Alertness: Awake/alert Behavior During Therapy: WFL for tasks assessed/performed Overall Cognitive Status: Within Functional Limits for tasks assessed                                        Exercises  seated knee flexion, contract relax to facilitate knee flexion.  10-80 degrees knee flexion.  AA knee extension seated x 20    General Comments General comments (skin integrity, edema, etc.): sister present      Pertinent Vitals/Pain Pain Assessment: 0-10 Pain Score: 5  Pain Location: right groin when bed flattened Pain Descriptors / Indicators: Aching;Discomfort;Grimacing;Moaning Pain Intervention(s): Monitored during session;Premedicated before session    Home Living                      Prior Function            PT Goals (current goals can now be found in the care plan section) Progress towards PT goals: Progressing toward goals    Frequency    Min 6X/week      PT Plan Current plan remains appropriate    Co-evaluation PT/OT/SLP Co-Evaluation/Treatment: Yes Reason for Co-Treatment: For patient/therapist safety PT goals addressed during session: Mobility/safety with mobility OT goals addressed during session: ADL's and self-care      AM-PAC PT "6 Clicks" Daily Activity  Outcome Measure  Difficulty turning over in bed (including adjusting bedclothes, sheets and blankets)?: Unable Difficulty moving from lying on back to sitting on the side of the bed? : Unable Difficulty sitting down on and standing up from a chair with arms (e.g., wheelchair, bedside commode, etc,.)?: Unable Help needed moving to and from a bed to chair (including a wheelchair)?: Total Help needed walking in hospital room?: Total Help needed climbing 3-5 steps with a railing? : Total 6 Click Score: 6    End of Session Equipment Utilized During Treatment: Gait belt Activity Tolerance: Patient tolerated treatment well Patient left: in chair;with call bell/phone within reach;with family/visitor present Nurse Communication: Mobility status PT Visit Diagnosis: Difficulty in walking, not elsewhere classified (R26.2);Pain Pain -  Right/Left: Right Pain - part of body: Hip     Time:1205-1235  PT Time Calculation (min) (ACUTE ONLY): 30 min  Charges:  2 exercise                    G CodesTresa Endo PT [phone]    [name] 08/18/2017, 2:47 PM

## 2017-08-18 NOTE — Progress Notes (Signed)
   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 Pt with R femoral neck fracture and underwent R DATHA on 08/16/17  Precautions  Precautions Fall;Knee  Restrictions  RLE Weight Bearing WBAT  Cognition  Arousal/Alertness Awake/alert  Bed Mobility  Overal bed mobility Needs Assistance  Bed Mobility Supine to Sit  Supine to sit Mod assist;HOB elevated  General bed mobility comments increased time, assistance with right  leg to bed edge  Transfers  Overall transfer level Needs assistance  Equipment used Rolling walker (2 wheeled)  Transfers Sit to/from Stand  Sit to Stand +2 physical assistance;+2 safety/equipment;Mod assist;From elevated surface  General transfer comment Cues for hand placement and LE management. Increased bed height. with extra time,   Ambulation/Gait  Ambulation/Gait assistance Min assist;+2 safety/equipment  Assistive device Rolling walker (2 wheeled)  Gait Pattern/deviations Step-to pattern;Decreased step length - right;Decreased stance time - right  General Gait Details some difficulty  PT - End of Session  Equipment Utilized During Treatment Gait belt  Activity Tolerance Patient tolerated treatment well  Patient left in chair;with call bell/phone within reach;with family/visitor present  Nurse Communication Mobility status  PT - Assessment/Plan  PT Plan Current plan remains appropriate  PT Visit Diagnosis Difficulty in walking, not elsewhere classified (R26.2);Pain  Pain - Right/Left Right  Pain - part of body Hip  PT Frequency (ACUTE ONLY) Min 6X/week  Follow Up Recommendations Home health PT  PT equipment None recommended by PT  AM-PAC PT "6 Clicks" Daily Activity Outcome Measure  Difficulty turning over in bed (including adjusting bedclothes, sheets and blankets)? 1  Difficulty moving from lying on back to sitting  on the side of the bed?  1  Difficulty sitting down on and standing up from a chair with arms (e.g., wheelchair, bedside commode, etc,.)? 1  Help needed moving to and from a bed to chair (including a wheelchair)? 1  Help needed walking in hospital room? 1  Help needed climbing 3-5 steps with a railing?  1  6 Click Score 6  Mobility G Code  CN  PT Goal Progression  Progress towards PT goals Progressing toward goals  PT Time Calculation  PT Start Time (ACUTE ONLY) 1003  PT Stop Time (ACUTE ONLY) 1027  PT Time Calculation (min) (ACUTE ONLY) 24 min  PT General Charges  $$ ACUTE PT VISIT 1 Visit  PT Treatments  $Gait Training 8-22 mins  [place] PT [phone]

## 2017-08-18 NOTE — Progress Notes (Signed)
Subjective: 2 Days Post-Op Procedure(s) (LRB): TOTAL HIP ARTHROPLASTY ANTERIOR APPROACH (Right) Patient reports pain as 3 on 0-10 scale.    Objective: Vital signs in last 24 hours: Temp:  [98 F (36.7 C)-98.4 F (36.9 C)] 98.4 F (36.9 C) (02/17 0546) Pulse Rate:  [71-78] 73 (02/17 0546) Resp:  [16-17] 16 (02/17 0546) BP: (98-107)/(50-70) 98/50 (02/17 0546) SpO2:  [99 %-100 %] 99 % (02/17 0546)  Intake/Output from previous day: 02/16 0701 - 02/17 0700 In: 360 [P.O.:360] Out: 1400 [Urine:1400] Intake/Output this shift: No intake/output data recorded.  Recent Labs    08/16/17 0530 08/17/17 0522 08/18/17 0530  HGB 10.8* 9.7* 8.3*   Recent Labs    08/17/17 0522 08/18/17 0530  WBC 7.8 6.0  RBC 3.38* 2.81*  HCT 30.2* 25.4*  PLT 197 170   Recent Labs    08/17/17 0522 08/18/17 0530  NA 139 139  K 4.6 3.9  CL 106 111  CO2 25 24  BUN 17 16  CREATININE 0.86 0.89  GLUCOSE 137* 96  CALCIUM 8.2* 7.9*   No results for input(s): LABPT, INR in the last 72 hours.  Neurologically intact ABD soft Sensation intact distally Intact pulses distally Incision: dressing C/D/I No DVT  Mild thigh swelling.  Assessment/Plan: 2 Days Post-Op Procedure(s) (LRB): TOTAL HIP ARTHROPLASTY ANTERIOR APPROACH (Right)  Acute blood loss anemia. Pt Asym. Concern with bleed previous. Discussed with Med to consult with GI. Repeat H/H at noon.    Shealee Yordy C 08/18/2017, 8:08 AM

## 2017-08-18 NOTE — Progress Notes (Signed)
Patient ID: Sandra [name], female   DOB: [birth year], 77 y.o.   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 Pt reports she tripped and injured her right leg. In ED, imaging studies confirmed displaced right femoral neck fracture.   Assessment & Plan:   Principal Problem:   Hip fracture ([place]), right femoral neck - s/p hip repair, post op day #2 - patient reports pain is 3/10 in severity  - PT eval done, HH PT recommended  - on Xarelto for DVT prophylaxis but Hg is down with no clear evidence of active bleeding - will repeat CBC today - will discuss with GI doctor for further recommendations   Active Problems:   DVT (deep venous thrombosis) ([place]) - back in 80's, currently on Xarelto for DVT prophylaxis from recent knee replacement - concern is that Xarelto is contributing to drop in Hg, will discuss with GI doctor     Fall - HH PT recommended     Normocytic anemia - has required total 4 U PRBC since admission  - EGD done, appreciate GI team assistance - EGD with large gastric ulcer in prepyloric area, no active bleeding, biopsy taken - avoid NSAID's - keep on PPI BID x 2 months, ok to switch to PO on discharge per GI team  - plan for repeat EGD in 2 months   - drop in Hg noted again, will repeat CBC this afternoon and will d/w GI  - CBC in AM    Diarrhea  - no further diarrhea, now constipation     Pre renal azotemia - resolved     Obesity  - Body mass index is 30.36 kg/m.  DVT prophylaxis: SCD's, Xarelto  Code Status: Full Family Communication: pt and sister at bedside  Disposition Plan: to be determined   Consultants:   Ortho   GI  Procedures:   Right hip repair 02/15  -->  Antimicrobials:   None  Subjective: Pt reports no concerns this AM.   Objective: Vitals:   08/17/17 0441 08/17/17 1007 08/17/17 2028 08/18/17 0546  BP: 113/61 106/70 107/60 (!) 98/50  Pulse: 65 71 78 73  Resp: 16  17 16   Temp: 98.1 F (36.7 C) 98 F (36.7 C) 98.4 F (36.9 C) 98.4 F (36.9 C)  TempSrc: Oral Oral Oral Oral  SpO2: 99% 99% 100% 99%  Weight:      Height:        Intake/Output Summary (Last 24 hours) at 08/18/2017 1129 Last data filed at 08/18/2017 0900 Gross per 24 hour  Intake 240 ml  Output 1200 ml  Net -960 ml   Filed Weights   08/16/17 1231  Weight: 73.9 kg (163 lb)   Physical Exam  Constitutional: Appears well-developed and well-nourished. No distress.  CVS: RRR, S1/S2 +, no murmurs, no gallops, no carotid bruit.  Pulmonary: Effort and breath sounds normal, no stridor, rhonchi, wheezes, rales.  Abdominal: Soft. BS +,  no distension, tenderness, rebound or guarding.  Musculoskeletal: Normal range of motion. No edema and no tenderness.   Data Reviewed: I have personally reviewed following labs and imaging studies  CBC: Recent Labs  Lab 08/13/17 1556 08/14/17 0544 08/14/17 1849  08/15/17 0610 08/16/17 0530 08/17/17 0522 08/18/17 0530  WBC 7.2 9.4  --  6.9 6.3 7.8 6.0  NEUTROABS 5.5  --   --   --   --   --   --   HGB 8.9* 6.5* 9.1* 8.6* 10.8* 9.7* 8.3*  HCT 26.8* 19.2* 27.6* 25.4* 32.4* 30.2* 25.4*  MCV 93.1 92.3  --  87.9 88.8 89.3 90.4  PLT 255 258  --  197 176 197 938   Basic Metabolic Panel: Recent Labs  Lab 08/14/17 0544 08/15/17 0610 08/16/17 0530 08/17/17 0522 08/18/17 0530  NA 139 141 139 139 139  K 4.1 3.8 3.9 4.6 3.9  CL 109 110 107 106 111  CO2 21* 23 25 25 24   GLUCOSE 127* 103* 105* 137* 96  BUN 57* 39* 21* 17 16  CREATININE 1.04* 0.97 0.98 0.86 0.89  CALCIUM 8.5* 8.1* 8.3* 8.2* 7.9*   Coagulation Profile: Recent Labs  Lab 08/13/17 2131  INR 1.45   Urine analysis:    Component Value Date/Time    COLORURINE YELLOW 02/25/2015 1906   APPEARANCEUR CLOUDY (A) 02/25/2015 1906   LABSPEC 1.016 02/25/2015 1906   PHURINE 6.0 02/25/2015 1906   GLUCOSEU NEGATIVE 02/25/2015 1906   HGBUR TRACE (A) 02/25/2015 1906   BILIRUBINUR NEGATIVE 02/25/2015 1906   KETONESUR NEGATIVE 02/25/2015 1906   PROTEINUR NEGATIVE 02/25/2015 1906   UROBILINOGEN 0.2 02/25/2015 1906   NITRITE NEGATIVE 02/25/2015 1906   LEUKOCYTESUR SMALL (A) 02/25/2015 1906    Radiology Studies: Dg Lumbar Spine 2-3 Views Result Date: 08/13/2017 1. No obvious malalignment; reduced sensitivity for fracture at L4 and L5 due to severe blurring of the vertebral outlines causing poor definition of the cortex on the lateral projections. This pleura is probably due to the combination of cross-table lateral projection and motion artifact and persist despite repeat projection. 2. Chronic lucency around the T10 pedicle screws, probably from loosening or infection.   Dg Knee Complete 4 Views Right Result Date: 08/13/2017 1. No appreciable fracture. 2. Suboptimal positioning because of the hip fracture. No true lateral could be obtained. Indeterminate for knee effusion. 3. Low but stable position of the patella. 4. Total knee arthroplasty noted. Dg Hip Unilat  With Pelvis 2-3 Views Right  Result Date: 08/13/2017 1. Displaced fracture of the right femoral neck.  Scheduled Meds: . gabapentin  400 mg Oral Daily  . gabapentin  800 mg Oral QHS  . pantoprazole (PROTONIX) IV  40 mg Intravenous Q12H  . polyethylene glycol  17 g Oral Daily  . rivaroxaban  10 mg Oral Daily   Continuous Infusions:   LOS: 5 days   Time spent: 25 minutes  25 minutes with > 50% of time discussing current diagnostic test results, clinical impression and plan of care.  Faye Ramsay, MD [facility] Pager [phone]  If 7PM-7AM, please contact night-coverage [email or web address] Password [facility] 08/18/2017, 11:29 AM

## 2017-08-18 NOTE — Progress Notes (Signed)
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 Pt with R femoral neck fracture and underwent R DATHA on 08/16/17    PT Comments    The  Patient experienced sharp  Pain in right groin x 2 during mobility and assisting  Right leg onto bed.   The patient's progress is slow.  Patient expresses feeling tired with mobility efforts.  The patient has challenges for  Returning home with 3 steps to enter and has at least 50-60' to walk through the house. The patient currently has made 8'' with 2 assisting. The patient may benefit from non emergency transport if [place] in the near future. The patient would be challenged to return to OP PT visits for knee rehab at this time so HHPT is recommended.  Follow Up Recommendations  Home health PT     Equipment Recommendations  None recommended by PT    Recommendations for Other Services       Precautions / Restrictions Precautions Precautions: Fall;Knee Precaution Comments: Educated pt on "no pillow behind knee" precaution Restrictions RLE Weight Bearing: Weight bearing as tolerated    Mobility  Bed Mobility Overal bed mobility: Needs Assistance Bed Mobility: Sit to Supine     Supine to sit: Mod assist;HOB elevated Sit to supine: Mod assist;Max assist   General bed mobility comments: attempted self assist right leg with leg lifter but experienced sharp pain in groin.  Required mod /max assist placing both legs onto bed.  Transfers Overall transfer level: Needs assistance Equipment used: Rolling walker (2 wheeled) Transfers: Sit to/from Stand Sit to Stand: +2 physical assistance;+2 safety/equipment;Mod assist;From elevated surface Stand pivot transfers: Mod assist       General transfer comment: Cues for hand placement and LE management.  Assist to rise from recliner and from [facility] [place].  Ambulation/Gait Ambulation/Gait assistance: Mod assist;Min assist;+2 physical assistance;+2 safety/equipment Ambulation Distance (Feet): 5 Feet(x 2) Assistive device: Rolling walker (2 wheeled) Gait Pattern/deviations: Step-to pattern;Trunk flexed;Decreased step length - right;Decreased stance time - right;Antalgic     General Gait Details: at times requires assistance to advance theright leg, cues for more erect posture.  Extra time for mobility and takng a few stepsto BSC then to bed, extra time turning.   Stairs            Wheelchair Mobility    Modified Rankin (Stroke Patients Only)       Balance                                            Cognition Arousal/Alertness: Awake/alert Behavior During Therapy: WFL for tasks assessed/performed Overall Cognitive Status: Within Functional Limits for tasks assessed                                        Exercises      General Comments General comments (skin integrity, edema, etc.): sister present      Pertinent Vitals/Pain Pain Assessment: 0-10 Pain Score: 5  Pain Location: right groin and anterior knee on right Pain Descriptors / Indicators: Aching;Discomfort;Sharp;Shooting Pain Intervention(s): Monitored during session;Premedicated before session  Home Living                      Prior Function            PT Goals (current goals can now be found in the care plan section) Progress towards PT goals: Progressing toward goals    Frequency    Min 6X/week      PT Plan Current plan remains appropriate    Co-evaluation PT/OT/SLP Co-Evaluation/Treatment: Yes Reason for Co-Treatment: For patient/therapist safety PT goals addressed during session: Mobility/safety with mobility OT goals addressed during session: ADL's and self-care      AM-PAC PT "6 Clicks" Daily Activity  Outcome Measure  Difficulty turning over in  bed (including adjusting bedclothes, sheets and blankets)?: Unable Difficulty moving from lying on back to sitting on the side of the bed? : Unable Difficulty sitting down on and standing up from a chair with arms (e.g., wheelchair, bedside commode, etc,.)?: Unable Help needed moving to and from a bed to chair (including a wheelchair)?: Total Help needed walking in hospital room?: Total Help needed climbing 3-5 steps with a railing? : Total 6 Click Score: 6    End of Session Equipment Utilized During Treatment: Gait belt Activity Tolerance: Patient limited by fatigue Patient left: in bed;with call bell/phone within reach;with family/visitor present Nurse Communication: Mobility status PT Visit Diagnosis: Difficulty in walking, not elsewhere classified (R26.2);Pain Pain - Right/Left: Right Pain - part of body: Hip     Time: 1500-1533 PT Time Calculation (min) (ACUTE ONLY): 33 min  Charges:  $ $Therapeutic Activity: 23-37 mins                    G CodesTresa Endo PT [phone] }   [name] 08/18/2017, 3:57 PM

## 2017-08-18 NOTE — Progress Notes (Signed)
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 Pt with R femoral neck fracture and underwent R DATHA on 08/16/17   OT comments  Pt demonstrates slow, but steady progress.  She was seen in conjunction with PT.  She was able to perform short distance ambulation with min A +2 and mod A +2 to move sit to stand.  She requires min - max A for LB ADLs.  Family is supportive and will be available to assist her at discharge.  Will need continued acute OT to facilitate increased independence with ADLs and safe discharge to home.    Follow Up Recommendations  Supervision/Assistance - 24 hour;Home health OT    Equipment Recommendations  None recommended by OT    Recommendations for Other Services      Precautions / Restrictions Precautions Precautions: Fall;Knee Restrictions RLE Weight Bearing: Weight bearing as tolerated       Mobility Bed Mobility Overal bed mobility: Needs Assistance Bed Mobility: Supine to Sit     Supine to sit: Mod assist;HOB elevated     General bed mobility comments: increased time, assistance with right  leg to bed edge  Transfers Overall transfer level: Needs assistance Equipment used: Rolling walker (2 wheeled) Transfers: Sit to/from Stand Sit to Stand: +2 physical assistance;+2 safety/equipment;Mod assist;From elevated surface         General transfer comment: Cues for hand placement and LE management. Increased bed height. with extra time,     Balance                                           ADL either performed or assessed with clinical judgement   ADL Overall ADL's : Needs assistance/impaired Eating/Feeding: Independent;Sitting                   Lower Body Dressing: Maximal assistance;Sit to/from stand    Toilet Transfer: Moderate assistance;+2 for physical assistance;Stand-pivot;RW;BSC   Toileting- Clothing Manipulation and Hygiene: Maximal assistance;Sit to/from stand       Functional mobility during ADLs: Moderate assistance;+2 for physical assistance;Rolling walker       Vision       Perception     Praxis      Cognition Arousal/Alertness: Awake/alert Behavior During Therapy: WFL for tasks assessed/performed Overall Cognitive Status: Within Functional Limits for tasks assessed                                          Exercises     Shoulder Instructions       General Comments sister present    Pertinent Vitals/ Pain       Pain Assessment: 0-10 Pain Score: 5  Pain Location: right groin when bed flattened Pain Descriptors / Indicators: Aching;Discomfort;Grimacing;Moaning Pain Intervention(s): Monitored during session;Premedicated before session  Home Living                                          Prior Functioning/Environment  Frequency  Min 2X/week        Progress Toward Goals  OT Goals(current goals can now be found in the care plan section)  Progress towards OT goals: Progressing toward goals     Plan Discharge plan remains appropriate    Co-evaluation    PT/OT/SLP Co-Evaluation/Treatment: Yes Reason for Co-Treatment: For patient/therapist safety;To address functional/ADL transfers   OT goals addressed during session: ADL's and self-care      AM-PAC PT "6 Clicks" Daily Activity     Outcome Measure   Help from another person eating meals?: None Help from another person taking care of personal grooming?: A Little Help from another person toileting, which includes using toliet, bedpan, or urinal?: A Lot Help from another person bathing (including washing, rinsing, drying)?: A Lot Help from another person to put on and taking off regular upper body clothing?: A Little Help from another  person to put on and taking off regular lower body clothing?: A Lot 6 Click Score: 16    End of Session Equipment Utilized During Treatment: Gait belt;Rolling walker  OT Visit Diagnosis: Unsteadiness on feet (R26.81);Muscle weakness (generalized) (M62.81);Pain Pain - Right/Left: Right Pain - part of body: Knee;Hip   Activity Tolerance Patient limited by fatigue   Patient Left in chair;with call bell/phone within reach;with family/visitor present;with chair alarm set   Nurse Communication Mobility status        Time: 3893-7342 OT Time Calculation (min): 23 min  Charges: OT General Charges $OT Visit: 1 Visit OT Treatments $Therapeutic Activity: 8-22 mins  [facility], OTR/L [phone]    Lucille Passy M 08/18/2017, 1:35 PM

## 2017-08-19 ENCOUNTER — Encounter (HOSPITAL_COMMUNITY): Payer: Self-pay | Admitting: Orthopedic Surgery

## 2017-08-19 LAB — CBC
HCT: 26.9 % — ABNORMAL LOW (ref 36.0–46.0)
Hemoglobin: 8.7 g/dL — ABNORMAL LOW (ref 12.0–15.0)
MCH: 29.3 pg (ref 26.0–34.0)
MCHC: 32.3 g/dL (ref 30.0–36.0)
MCV: 90.6 fL (ref 78.0–100.0)
PLATELETS: 194 10*3/uL (ref 150–400)
RBC: 2.97 MIL/uL — ABNORMAL LOW (ref 3.87–5.11)
RDW: 15.4 % (ref 11.5–15.5)
WBC: 6.1 10*3/uL (ref 4.0–10.5)

## 2017-08-19 LAB — BASIC METABOLIC PANEL
Anion gap: 6 (ref 5–15)
BUN: 13 mg/dL (ref 6–20)
CALCIUM: 8.2 mg/dL — AB (ref 8.9–10.3)
CO2: 26 mmol/L (ref 22–32)
CREATININE: 0.8 mg/dL (ref 0.44–1.00)
Chloride: 108 mmol/L (ref 101–111)
GFR calc non Af Amer: >60 mL/min (ref >60)
Glucose, Bld: 105 mg/dL — ABNORMAL HIGH (ref 65–99)
Potassium: 4.2 mmol/L (ref 3.5–5.1)
SODIUM: 140 mmol/L (ref 135–145)

## 2017-08-19 MED ORDER — PANTOPRAZOLE SODIUM 40 MG PO TBEC
40.0000 mg | DELAYED_RELEASE_TABLET | Freq: Two times a day (BID) | ORAL | Status: DC
  Administered 2017-08-19 – 2017-08-20 (×2): 40 mg via ORAL
  Filled 2017-08-19 (×2): qty 1

## 2017-08-19 MED ORDER — SENNOSIDES-DOCUSATE SODIUM 8.6-50 MG PO TABS
1.0000 | ORAL_TABLET | Freq: Two times a day (BID) | ORAL | Status: DC
  Administered 2017-08-19 – 2017-08-20 (×3): 1 via ORAL
  Filled 2017-08-19 (×3): qty 1

## 2017-08-19 NOTE — Progress Notes (Signed)
    Subjective:  Patient reports pain as mild to moderate.  Denies N/V/CP/SOB. Patient eager to do more PT and improve mobility.  Objective:   VITALS:   Vitals:   08/18/17 0546 08/18/17 1428 08/18/17 2217 08/19/17 0557  BP: (!) 98/50 (!) 109/55 136/62 (!) 97/51  Pulse: 73 74 79 79  Resp: 16 16 17 16   Temp: 98.4 F (36.9 C) 98.3 F (36.8 C) 98.7 F (37.1 C) 98.7 F (37.1 C)  TempSrc: Oral Oral Oral Oral  SpO2: 99% 98% 93% 97%  Weight:      Height:        NAD ABD soft Sensation intact distally Intact pulses distally Dorsiflexion/Plantar flexion intact Incision: dressing C/D/I Compartment soft   Lab Results  Component Value Date   WBC 6.1 08/19/2017   HGB 8.7 (L) 08/19/2017   HCT 26.9 (L) 08/19/2017   MCV 90.6 08/19/2017   PLT 194 08/19/2017   BMET    Component Value Date/Time   NA 140 08/19/2017 0701   NA 141 10/16/2014   K 4.2 08/19/2017 0701   CL 108 08/19/2017 0701   CO2 26 08/19/2017 0701   GLUCOSE 105 (H) 08/19/2017 0701   BUN 13 08/19/2017 0701   BUN 12 10/16/2014   CREATININE 0.80 08/19/2017 0701   CALCIUM 8.2 (L) 08/19/2017 0701   GFRNONAA >60 08/19/2017 0701   GFRAA >60 08/19/2017 0701     Assessment/Plan: 3 Days Post-Op   Principal Problem:   Hip fracture (HCC) Active Problems:   DVT (deep venous thrombosis) (HCC) history of   Fall   Normocytic anemia   Displaced fracture of right femoral neck (HCC)   WBAT with walker DVT ppx: xarelto per GI, SCDs, TEDS PO pain control PT/OT Dispo: d/c home with HHPT when medically clear   Hilton Cork Safiyah Cisney 08/19/2017, 7:57 AM   Rod Can, MD Cell [phone]

## 2017-08-19 NOTE — Progress Notes (Signed)
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 Pt with R femoral neck fracture and underwent R DATHA on 08/16/17    PT Comments    The patient is progressing with ambulation. Practiced stepping up to get ontop high bed. Also has high truck with step to get into and 2 steps to enter home. May tbenefit from nonemergency transport. Will continue to assess needs.    Follow Up Recommendations  Home health PT     Equipment Recommendations  None recommended by PT    Recommendations for Other Services OT consult     Precautions / Restrictions Precautions Precautions: Fall;Knee Precaution Comments: Educated pt on "no pillow behind knee" precaution; TKA sx on 07/29/17 Restrictions RLE Weight Bearing: Weight bearing as tolerated    Mobility  Bed Mobility           Sit to supine: Mod assist   General bed mobility comments: in recliner  Transfers Overall transfer level: Needs assistance Equipment used: Rolling walker (2 wheeled) Transfers: Sit to/from Stand Sit to Stand: Min assist Stand pivot transfers: Min guard       General transfer comment: for safety from recliner and bed, practiced stepping backwards onto stepstool to get onto tall bed which the patient was able to do.  Ambulation/Gait Ambulation/Gait assistance: Min assist Ambulation Distance (Feet): 90 Feet Assistive device: Rolling walker (2 wheeled) Gait Pattern/deviations: Step-to pattern;Step-through pattern     General Gait Details: patient at times has to make extra effort to advance the  right leg. much improved in ambulation.   Stairs            Wheelchair Mobility    Modified Rankin (Stroke Patients Only)       Balance                                             Cognition Arousal/Alertness: Awake/alert Behavior During Therapy: WFL for tasks assessed/performed Overall Cognitive Status: Within Functional Limits for tasks assessed                                        Exercises      General Comments General comments (skin integrity, edema, etc.): sister present and will assist pt at home. Pt initially was going to sit up in chair for lunch but unable to tolerate due to muscle cramping. Returned to bed      Pertinent Vitals/Pain Pain Score: 10-Worst pain ever Pain Location: R groin cramping; R knee sore Pain Descriptors / Indicators: Aching;Discomfort;Sharp;Shooting Pain Intervention(s): Patient requesting pain meds-RN notified;RN gave pain meds during session    Home Living                      Prior Function            PT Goals (current goals can now be found in the care plan section) Progress towards PT goals: Progressing toward goals    Frequency    Min 6X/week      PT Plan Current plan remains appropriate    Co-evaluation  AM-PAC PT "6 Clicks" Daily Activity  Outcome Measure  Difficulty turning over in bed (including adjusting bedclothes, sheets and blankets)?: Unable Difficulty moving from lying on back to sitting on the side of the bed? : Unable Difficulty sitting down on and standing up from a chair with arms (e.g., wheelchair, bedside commode, etc,.)?: Unable Help needed moving to and from a bed to chair (including a wheelchair)?: Total Help needed walking in hospital room?: Total Help needed climbing 3-5 steps with a railing? : Total 6 Click Score: 6    End of Session Equipment Utilized During Treatment: Gait belt Activity Tolerance: Patient tolerated treatment well Patient left: (with OT) Nurse Communication: Mobility status PT Visit Diagnosis: Difficulty in walking, not elsewhere classified (R26.2);Pain Pain - Right/Left: Right Pain - part of body: Hip      Time: 1113-1150 PT Time Calculation (min) (ACUTE ONLY): 37 min  Charges:  $Gait Training: 8-22 mins $Self Care/Home Management: 8-22                    G CodesTresa Endo PT [phone]   [name] 08/19/2017, 1:57 PM

## 2017-08-19 NOTE — Progress Notes (Signed)
Subjective: Constipated. No abdominal pain. No blood in stool.  Objective: Vital signs in last 24 hours: Temp:  [98.3 F (36.8 C)-98.7 F (37.1 C)] 98.7 F (37.1 C) (02/18 0557) Pulse Rate:  [74-79] 79 (02/18 0557) Resp:  [16-17] 16 (02/18 0557) BP: (97-136)/(51-62) 97/51 (02/18 0557) SpO2:  [93 %-98 %] 97 % (02/18 0557) Weight change:  Last BM Date: 08/15/17  PE: GEN:  Overweight, NAD EXT:  Right knee swelling after surgery with some ecchymoses  Lab Results: CBC    Component Value Date/Time   WBC 6.1 08/19/2017 0701   RBC 2.97 (L) 08/19/2017 0701   HGB 8.7 (L) 08/19/2017 0701   HCT 26.9 (L) 08/19/2017 0701   PLT 194 08/19/2017 0701   MCV 90.6 08/19/2017 0701   MCH 29.3 08/19/2017 0701   MCHC 32.3 08/19/2017 0701   RDW 15.4 08/19/2017 0701   LYMPHSABS 1.6 08/18/2017 1319   MONOABS 0.7 08/18/2017 1319   EOSABS 0.4 08/18/2017 1319   BASOSABS 0.0 08/18/2017 1319   CMP     Component Value Date/Time   NA 140 08/19/2017 0701   NA 141 10/16/2014   K 4.2 08/19/2017 0701   CL 108 08/19/2017 0701   CO2 26 08/19/2017 0701   GLUCOSE 105 (H) 08/19/2017 0701   BUN 13 08/19/2017 0701   BUN 12 10/16/2014   CREATININE 0.80 08/19/2017 0701   CALCIUM 8.2 (L) 08/19/2017 0701   PROT 7.3 07/24/2017 0844   ALBUMIN 3.8 07/24/2017 0844   AST 26 07/24/2017 0844   ALT 18 07/24/2017 0844   ALKPHOS 65 07/24/2017 0844   BILITOT 0.6 07/24/2017 0844   GFRNONAA >60 08/19/2017 0701   GFRAA >60 08/19/2017 0701   Assessment:  1.  Anemia.  No evidence of overt bleeding.  Hgb fairly stable past few days.  Multifactorial: Could be some dilutional effects and maybe from some post-operative hematoma.  No overt GI bleeding. 2.  Gastric ulcer.  Plan:  1.  Pantoprazole 40 mg po bid x 3 months, then 40 mg po qd indefinitely.  Will need PPI indefinitely regardless of whether or not she has to continue Xarelto. 2.  No further Gi work-up planned as inpatient. 3.  Patient can follow-up with Dr.  Alessandra Bevels with [facility] upon discharge, call [phone] to set up appointment. 4.  [facility] will sign-off; please call with questions; thank you for the consult.   Sandra [name] 08/19/2017, 11:00 AM   Cell [phone] If no answer or after 5 PM call [phone]

## 2017-08-19 NOTE — Progress Notes (Signed)
Patient ID: Sandra [name], female   DOB: [birth year], 77 y.o.   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 Pt reports she tripped and injured her right leg. In ED, imaging studies confirmed displaced right femoral neck fracture.   Assessment & Plan:   Principal Problem:   Hip fracture ([place]), right femoral neck - s/p hip repair, post op day #3 - pain is better controlled  - PT eval done, HH PT recommended  - on Xarelto for DVT prophylaxis and will continue per GI  - repeat CBC in AM to make sure Hg remains stable   Active Problems:   DVT (deep venous thrombosis) ([place]) - back in 80's, currently on Xarelto for DVT prophylaxis from recent knee replacement - continue Xarelto     Fall - HH PT recommended, orders placed     Normocytic anemia - has required total 4 U PRBC since admission  - EGD done, appreciate GI team assistance - EGD with large gastric ulcer in prepyloric area, no active bleeding, biopsy taken - avoid NSAID's - keep on PPI BID x 2 months, ok to switch to PO on discharge per GI team  - plan for repeat EGD in 2 months   - Slight drop in Hg overnight but no evidence of active bleeding  - will repeat CBC in AM    Diarrhea  - still with no diarrhea but constipation - will place on bowel regimen and monitor     Pre renal azotemia - resolved     Obesity  - Body mass index is 30.36 kg/m.  DVT prophylaxis: SCD's, Xarelto  Code Status: Full Family Communication: pt and sister at bedside  Disposition Plan: home tomorrow if Hg stable   Consultants:   Ortho   GI  Procedures:   Right hip repair 02/15 -->  Antimicrobials:   None  Subjective: Pt reports no concerns this AM.    Objective: Vitals:   08/18/17 0546 08/18/17 1428 08/18/17 2217 08/19/17 0557  BP: (!) 98/50 (!) 109/55 136/62 (!) 97/51  Pulse: 73 74 79 79  Resp: 16 16 17 16   Temp: 98.4 F (36.9 C) 98.3 F (36.8 C) 98.7 F (37.1 C) 98.7 F (37.1 C)  TempSrc: Oral Oral Oral Oral  SpO2: 99% 98% 93% 97%  Weight:      Height:        Intake/Output Summary (Last 24 hours) at 08/19/2017 1117 Last data filed at 08/19/2017 0557 Gross per 24 hour  Intake 540 ml  Output 700 ml  Net -160 ml   Filed Weights   08/16/17 1231  Weight: 73.9 kg (163 lb)   Physical Exam  Constitutional: Appears well-developed and well-nourished. No distress.  CVS: RRR, S1/S2 +, no murmurs, no gallops, no carotid bruit.  Pulmonary: Effort and breath sounds normal, no stridor, rhonchi, wheezes, rales.  Abdominal: Soft. BS +,  no distension, tenderness, rebound or guarding.  Musculoskeletal: Normal range of motion. No edema and no tenderness.   Data Reviewed: I have personally reviewed following labs and imaging studies  CBC: Recent Labs  Lab 08/13/17 1556  08/16/17 0530 08/17/17 0522 08/18/17 0530 08/18/17 1319 08/19/17 0701  WBC 7.2   < >  6.3 7.8 6.0 6.7 6.1  NEUTROABS 5.5  --   --   --   --  4.0  --   HGB 8.9*   < > 10.8* 9.7* 8.3* 9.0* 8.7*  HCT 26.8*   < > 32.4* 30.2* 25.4* 27.8* 26.9*  MCV 93.1   < > 88.8 89.3 90.4 91.4 90.6  PLT 255   < > 176 197 170 210 194   < > = values in this interval not displayed.   Basic Metabolic Panel: Recent Labs  Lab 08/15/17 0610 08/16/17 0530 08/17/17 0522 08/18/17 0530 08/19/17 0701  NA 141 139 139 139 140  K 3.8 3.9 4.6 3.9 4.2  CL 110 107 106 111 108  CO2 23 25 25 24 26   GLUCOSE 103* 105* 137* 96 105*  BUN 39* 21* 17 16 13   CREATININE 0.97 0.98 0.86 0.89 0.80  CALCIUM 8.1* 8.3* 8.2* 7.9* 8.2*   Coagulation Profile: Recent Labs  Lab 08/13/17 2131  INR 1.45   Urine analysis:    Component Value Date/Time   COLORURINE YELLOW 02/25/2015 1906    APPEARANCEUR CLOUDY (A) 02/25/2015 1906   LABSPEC 1.016 02/25/2015 1906   PHURINE 6.0 02/25/2015 1906   GLUCOSEU NEGATIVE 02/25/2015 1906   HGBUR TRACE (A) 02/25/2015 1906   BILIRUBINUR NEGATIVE 02/25/2015 1906   KETONESUR NEGATIVE 02/25/2015 1906   PROTEINUR NEGATIVE 02/25/2015 1906   UROBILINOGEN 0.2 02/25/2015 1906   NITRITE NEGATIVE 02/25/2015 1906   LEUKOCYTESUR SMALL (A) 02/25/2015 1906    Radiology Studies: Dg Lumbar Spine 2-3 Views Result Date: 08/13/2017 1. No obvious malalignment; reduced sensitivity for fracture at L4 and L5 due to severe blurring of the vertebral outlines causing poor definition of the cortex on the lateral projections. This pleura is probably due to the combination of cross-table lateral projection and motion artifact and persist despite repeat projection. 2. Chronic lucency around the T10 pedicle screws, probably from loosening or infection.   Dg Knee Complete 4 Views Right Result Date: 08/13/2017 1. No appreciable fracture. 2. Suboptimal positioning because of the hip fracture. No true lateral could be obtained. Indeterminate for knee effusion. 3. Low but stable position of the patella. 4. Total knee arthroplasty noted. Dg Hip Unilat  With Pelvis 2-3 Views Right  Result Date: 08/13/2017 1. Displaced fracture of the right femoral neck.  Scheduled Meds: . gabapentin  400 mg Oral Daily  . gabapentin  800 mg Oral QHS  . pantoprazole (PROTONIX) IV  40 mg Intravenous Q12H  . polyethylene glycol  17 g Oral Daily  . rivaroxaban  10 mg Oral Daily   Continuous Infusions:   LOS: 6 days   Time spent: 25 minutes  25 minutes with > 50% of time discussing current diagnostic test results, clinical impression and plan of care.  Faye Ramsay, MD [facility] Pager [phone]  If 7PM-7AM, please contact night-coverage [email or web address] Password [facility] 08/19/2017, 11:17 AM

## 2017-08-19 NOTE — Progress Notes (Signed)
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 Pt with R femoral neck fracture and underwent R DATHA on 08/16/17    PT Comments    Working with patient on simulating getting into a high truck  That requires patient to step up onto step stool then running board. The patient requires extensive assistance for the right LEg. Recommend nonemergency transport if available.  Continue PT.   Follow Up Recommendations  Home health PT(Recommend non emergency transport.)     Equipment Recommendations  None recommended by PT    Recommendations for Other Services OT consult     Precautions / Restrictions Precautions Precautions: Fall;Knee Precaution Comments: Educated pt on "no pillow behind knee" precaution; TKA sx on 07/29/17 Restrictions RLE Weight Bearing: Weight bearing as tolerated    Mobility  Bed Mobility   Bed Mobility: Sit to Supine       Sit to supine: Mod assist   General bed mobility comments: from right side of bed with step stool, able to step up with min assist, requires mod assist for legs onto bed.  Transfers Overall transfer level: Needs assistance Equipment used: Rolling walker (2 wheeled) Transfers: Sit to/from Stand Sit to Stand: Min assist Stand pivot transfers: Min guard       General transfer comment: for safety from recliner and bed, practiced stepping backwards onto stepstool to get onto tall bed which the patient was able to do.  Ambulation/Gait Ambulation/Gait assistance: Min assist Ambulation Distance (Feet): 75 Feet Assistive device: Rolling walker (2 wheeled) Gait Pattern/deviations: Step-to pattern;Step-through pattern     General Gait Details: gait is much smoother this visit. Patient reports fatigue   Stairs            Wheelchair  Mobility    Modified Rankin (Stroke Patients Only)       Balance                                            Cognition Arousal/Alertness: Awake/alert Behavior During Therapy: WFL for tasks assessed/performed Overall Cognitive Status: Within Functional Limits for tasks assessed                                        Exercises      General Comments General comments (skin integrity, edema, etc.): sister present and will assist pt at home. Pt initially was going to sit up in chair for lunch but unable to tolerate due to muscle cramping. Returned to bed      Pertinent Vitals/Pain Pain Score: 6  Pain Location: R groin cramping;  Pain Descriptors / Indicators: Aching;Discomfort;Sharp;Shooting Pain Intervention(s): Monitored during session;Patient requesting pain meds-RN notified    Home Living                      Prior Function            PT Goals (current goals can now be found in the care plan section) Progress towards PT goals: Progressing toward goals    Frequency    Min 6X/week      PT Plan  Current plan remains appropriate    Co-evaluation              AM-PAC PT "6 Clicks" Daily Activity  Outcome Measure  Difficulty turning over in bed (including adjusting bedclothes, sheets and blankets)?: Unable Difficulty moving from lying on back to sitting on the side of the bed? : Unable Difficulty sitting down on and standing up from a chair with arms (e.g., wheelchair, bedside commode, etc,.)?: Unable Help needed moving to and from a bed to chair (including a wheelchair)?: Total Help needed walking in hospital room?: Total Help needed climbing 3-5 steps with a railing? : Total 6 Click Score: 1    End of Session Equipment Utilized During Treatment: Gait belt Activity Tolerance: Patient tolerated treatment well Patient left: in bed;with call bell/phone within reach;with family/visitor present Nurse Communication:  Mobility status PT Visit Diagnosis: Difficulty in walking, not elsewhere classified (R26.2);Pain Pain - Right/Left: Right Pain - part of body: Hip     Time: 2103-1281 PT Time Calculation (min) (ACUTE ONLY): 28 min  Charges:  $Gait Training: 8-22 mins $Therapeutic Activity: 8-22 mins                    G Codes:          [name] 08/19/2017, 4:48 PM

## 2017-08-19 NOTE — Progress Notes (Signed)
Discharge planning, spoke with patient at beside. Chose [facility] for HH services, PT to eval and treat. Contacted [facility] for referral. Has RW and 3-n-1. [phone] 

## 2017-08-19 NOTE — Progress Notes (Addendum)
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 Pt with R femoral neck fracture and underwent R DATHA on 08/16/17   OT comments  Pt was limited by pain this session (cramping). She was able to walk to bathroom; tried to sit in chair but ultimately returned to bed for comfort. Pt would like to review shower transfer prior to d/c home tomorrow.  Follow Up Recommendations  Supervision/Assistance - 24 hour;Home health OT    Equipment Recommendations  None recommended by OT    Recommendations for Other Services      Precautions / Restrictions Precautions Precautions: Fall;Knee Precaution Comments: Educated pt on "no pillow behind knee" precaution; TKA on 07/29/17 Restrictions RLE Weight Bearing: Weight bearing as tolerated       Mobility Bed Mobility           Sit to supine: Mod assist   General bed mobility comments: assist for bil LEs due to pain from severe muscle cramps  Transfers   Equipment used: Rolling walker (2 wheeled)     Stand pivot transfers: Min guard       General transfer comment: for safety from 3:1 commode    Balance                                           ADL either performed or assessed with clinical judgement   ADL       Grooming: Min guard;Standing;Wash/dry Geophysical data processor Transfer: Min guard;Ambulation;RW;BSC   Toileting- Water quality scientist and Hygiene: Min guard;Sit to/from stand         General ADL Comments: ambulated to bathroom and performed above activities.  Cues to sidestep through tight spaces.  Reinforced knee precautions due to recent TKA.       Vision       Perception     Praxis      Cognition Arousal/Alertness: Awake/alert Behavior During Therapy: WFL for tasks  assessed/performed Overall Cognitive Status: Within Functional Limits for tasks assessed                                          Exercises     Shoulder Instructions       General Comments sister present and will assist pt at home. Pt initially was going to sit up in chair for lunch but unable to tolerate due to muscle cramping. Returned to bed    Pertinent Vitals/ Pain       Pain Score: 10-Worst pain ever Pain Location: R groin cramping; R knee sore Pain Intervention(s): Limited activity within patient's tolerance;Monitored during session;Premedicated before session;Repositioned;RN gave pain meds during session;Ice applied  Home Living                                          Prior Functioning/Environment              Frequency  Progress Toward Goals  OT Goals(current goals can now be found in the care plan section)  Progress towards OT goals: Progressing toward goals     Plan      Co-evaluation                 AM-PAC PT "6 Clicks" Daily Activity     Outcome Measure   Help from another person eating meals?: None Help from another person taking care of personal grooming?: A Little Help from another person toileting, which includes using toliet, bedpan, or urinal?: A Little Help from another person bathing (including washing, rinsing, drying)?: A Lot Help from another person to put on and taking off regular upper body clothing?: A Little Help from another person to put on and taking off regular lower body clothing?: A Lot 6 Click Score: 17    End of Session    OT Visit Diagnosis: Unsteadiness on feet (R26.81);Muscle weakness (generalized) (M62.81);Pain Pain - Right/Left: Right Pain - part of body: Knee;Hip   Activity Tolerance Patient limited by pain   Patient Left in bed;with call bell/phone within reach;with bed alarm set;with family/visitor present   Nurse Communication          Time:  4627-0350 OT Time Calculation (min): 31 min  Charges: OT General Charges $OT Visit: 1 Visit OT Treatments $Self Care/Home Management : 23-37 mins  Lesle Chris, OTR/L [phone] 08/19/2017   Sandra [name] 08/19/2017, 1:15 PM

## 2017-08-20 LAB — CBC
HEMATOCRIT: 24.1 % — AB (ref 36.0–46.0)
HEMOGLOBIN: 7.8 g/dL — AB (ref 12.0–15.0)
MCH: 29.3 pg (ref 26.0–34.0)
MCHC: 32.4 g/dL (ref 30.0–36.0)
MCV: 90.6 fL (ref 78.0–100.0)
Platelets: 191 10*3/uL (ref 150–400)
RBC: 2.66 MIL/uL — ABNORMAL LOW (ref 3.87–5.11)
RDW: 15.6 % — AB (ref 11.5–15.5)
WBC: 6.2 10*3/uL (ref 4.0–10.5)

## 2017-08-20 LAB — BASIC METABOLIC PANEL
ANION GAP: 8 (ref 5–15)
BUN: 11 mg/dL (ref 6–20)
CALCIUM: 8 mg/dL — AB (ref 8.9–10.3)
CHLORIDE: 108 mmol/L (ref 101–111)
CO2: 24 mmol/L (ref 22–32)
CREATININE: 0.85 mg/dL (ref 0.44–1.00)
GLUCOSE: 105 mg/dL — AB (ref 65–99)
Potassium: 3.9 mmol/L (ref 3.5–5.1)
Sodium: 140 mmol/L (ref 135–145)

## 2017-08-20 MED ORDER — OXYCODONE HCL 10 MG PO TABS: 10.0000 mg | ORAL_TABLET | ORAL | 0 refills | Status: DC | PRN

## 2017-08-20 NOTE — Progress Notes (Signed)
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 Pt with R femoral neck fracture and underwent R DATHA on 08/16/17    PT Comments    The patient is ambulating well, using sneakers with improved sequence and posture. Plans non emergency transport home due to difficulties getting into vehicle and negotiatting steps.  Follow Up Recommendations  Home health PT     Equipment Recommendations  None recommended by PT    Recommendations for Other Services       Precautions / Restrictions Precautions Precautions: Fall;Knee    Mobility  Bed Mobility               General bed mobility comments: in bathroom  Transfers Overall transfer level: Needs assistance Equipment used: Rolling walker (2 wheeled) Transfers: Sit to/from Stand   Stand pivot transfers: Supervision       General transfer comment: for safety; cues for UE placement  Ambulation/Gait Ambulation/Gait assistance: Min guard Ambulation Distance (Feet): 100 Feet Assistive device: Rolling walker (2 wheeled) Gait Pattern/deviations: Step-to pattern;Step-through pattern     General Gait Details: gait is much smoother this visit. increased distance.    Stairs            Wheelchair Mobility    Modified Rankin (Stroke Patients Only)       Balance                                            Cognition Arousal/Alertness: Awake/alert                                            Exercises      General Comments        Pertinent Vitals/Pain Pain Score: 3  Pain Location: R groin cramping;  Pain Descriptors / Indicators: Aching;Discomfort Pain Intervention(s): Monitored during session;Premedicated before session    Home Living                      Prior  Function            PT Goals (current goals can now be found in the care plan section) Progress towards PT goals: Progressing toward goals    Frequency    Min 6X/week      PT Plan Current plan remains appropriate    Co-evaluation              AM-PAC PT "6 Clicks" Daily Activity  Outcome Measure  Difficulty turning over in bed (including adjusting bedclothes, sheets and blankets)?: A Lot Difficulty moving from lying on back to sitting on the side of the bed? : A Lot Difficulty sitting down on and standing up from a chair with arms (e.g., wheelchair, bedside commode, etc,.)?: A Lot Help needed moving to and from a bed to chair (including a wheelchair)?: A Little Help needed walking in hospital room?: A Little Help needed climbing 3-5 steps with a railing? : Total 6 Click Score: 13    End of Session   Activity Tolerance: Patient tolerated treatment well Patient left: with call  bell/phone within reach;with family/visitor present;in chair   PT Visit Diagnosis: Difficulty in walking, not elsewhere classified (R26.2);Pain Pain - Right/Left: Right     Time: 3212-2482 PT Time Calculation (min) (ACUTE ONLY): 14 min  Charges:  $Gait Training: 8-22 mins                    G CodesTresa Endo PT [phone]    [name] 08/20/2017, 4:28 PM

## 2017-08-20 NOTE — Discharge Summary (Addendum)
Physician Discharge Summary  Sandra [name] MEQ:[id number] DOB: 06-26-1941 DOA: 08/13/2017  PCP: Jonathon Jordan, MD  Admit date: 08/13/2017 Discharge date: 08/20/2017  Recommendations for Outpatient Follow-up:  1. Pt will need to follow up with PCP in 1 week post discharge 2. Please obtain BMP to evaluate electrolytes and kidney function 3. Please also check CBC to evaluate Hg and Hct levels 4. Please note that pt underwent EGD, results noted below, per GI team recommendations, pt is to continue taking Protonix BID for at least three months and can be changed to PO QD at that time 5. Pt will also need to see GI on follow up and repeat EGD in 2 months per their recommendations  6. No further recommendations per GI team other than if need transfusion 7. If Hg continues to drop, Xarelto may need to be held  8. Once pt completed therapy with Xarelto, it was recommended she continues taking baby aspirin 81 mg PO QD for 3 more weeks, please confirm with GI team if that is [place] as they have recommended no NSAID's for now  Discharge Diagnoses:  Principal Problem:   Hip fracture ([place]) Active Problems:   DVT (deep venous thrombosis) ([place]) history of   Fall   Normocytic anemia   Displaced fracture of right femoral neck ([place])    Discharge Condition: Stable  Diet recommendation: Heart healthy diet discussed in details   History of present illness:  Pt is 77 yo female with known hx of DVT in 80's, 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 Pt reports she tripped and injured her right leg. In ED, imaging studies confirmed displaced right femoral neck fracture.   Assessment & Plan:   Principal Problem:   Hip fracture ([place]), right femoral neck - s/p hip repair, post op day #4 - pain is better controlled  - PT eval done, HH PT recommended  - on Xarelto for DVT prophylaxis and will continue per GI  - Hg drop  again noted but pt asymptomatic, per GI needs outpatient follow up   Active Problems:   DVT (deep venous thrombosis) ([place]) - back in 80's, currently on Xarelto for DVT prophylaxis from recent knee replacement - continue Xarelto     Fall - HH PT recommended, orders placed     Normocytic anemia - has required total 4 U PRBC since admission  - EGD done, appreciate GI team assistance - EGD with large gastric ulcer in prepyloric area, no active bleeding, biopsy taken - avoid NSAID's - keep on PPI BID x 3 months, and than QD indefinitely  - plan for repeat EGD in 2 months   - Slight drop in Hg overnight but no evidence of active bleeding  - per GI outpatient follow up recommended     Diarrhea  - resolved     Pre renal azotemia - resolved     Obesity  - Body mass index is 30.36 kg/m.  DVT prophylaxis: SCD's, Xarelto  Code Status: Full Family Communication: pt and sister at bedside  Disposition Plan: home   Consultants:   Ortho   GI  Procedures:   Right hip repair 02/15 -->  Antimicrobials:   None  Procedures/Studies:  Discharge Exam: Vitals:   08/19/17 2146 08/20/17 0549  BP: (!) 102/50 (!) 101/51  Pulse: 93 79  Resp: 17 16  Temp: 99.5 F (37.5 C) 98.6 F (37 C)  SpO2:  99% 96%   Vitals:   08/19/17 0557 08/19/17 1330 08/19/17 2146 08/20/17 0549  BP: (!) 97/51 (!) 102/50 (!) 102/50 (!) 101/51  Pulse: 79 80 93 79  Resp: 16 16 17 16   Temp: 98.7 F (37.1 C) 98.3 F (36.8 C) 99.5 F (37.5 C) 98.6 F (37 C)  TempSrc: Oral Oral Oral Oral  SpO2: 97% 98% 99% 96%  Weight:      Height:        General: Pt is alert, follows commands appropriately, not in acute distress Cardiovascular: Regular rate and rhythm, S1/S2 +, no murmurs, no rubs, no gallops Respiratory: Clear to auscultation bilaterally, no wheezing, no crackles, no rhonchi Abdominal: Soft, non tender, non distended, bowel sounds +, no guarding Extremities: no edema, no cyanosis, pulses  palpable bilaterally DP and PT Neuro: Grossly nonfocal  Discharge Instructions  Discharge Instructions    Diet - low sodium heart healthy   Complete by:  As directed    Diet - low sodium heart healthy   Complete by:  As directed    Increase activity slowly   Complete by:  As directed    Increase activity slowly   Complete by:  As directed      Allergies as of 08/20/2017      Reactions   Clarithromycin Diarrhea   Codeine Other (See Comments)   Headache   Resinol [dermatological Products, Misc.] Other (See Comments)   Skin was like "raw meat"..Skin peeled off.   Sulfa Antibiotics Rash      Medication List    STOP taking these medications   traMADol 50 MG tablet Commonly known as:  ULTRAM     TAKE these medications   alendronate 70 MG tablet Commonly known as:  FOSAMAX Take 70 mg by mouth once a week. Take with a full glass of water on an empty stomach.   gabapentin 400 MG capsule Commonly known as:  NEURONTIN Take 400-800 mg by mouth See admin instructions. Take 400 mg in the morning and 800 mg at night   HM POTASSIUM 595 (99 K) MG Tabs tablet Generic drug:  potassium gluconate Take 595 mg by mouth at bedtime.   HYDROcodone-acetaminophen 5-325 MG tablet Commonly known as:  NORCO/VICODIN Take 1-2 tablets by mouth every 6 (six) hours as needed for moderate pain (if unrelieved by oxycodone).   methocarbamol 500 MG tablet Commonly known as:  ROBAXIN Take 1 tablet (500 mg total) by mouth every 6 (six) hours as needed for muscle spasms.   Oxycodone HCl 10 MG Tabs Take 1-2 tablets (10-20 mg total) by mouth every 4 (four) hours as needed. For pain   pantoprazole 40 MG tablet Commonly known as:  PROTONIX Take 1 tablet (40 mg total) by mouth 2 (two) times daily.   REFRESH OP Apply 1 drop to eye 4 (four) times daily as needed (dry eyes).   rivaroxaban 10 MG Tabs tablet Commonly known as:  XARELTO Take 1 tablet (10 mg total) by mouth daily with breakfast. Take  Xarelto for two and a half more weeks following discharge from the hospital, then discontinue Xarelto. Once the patient has completed the blood thinner regimen, then take a Baby 81 mg Aspirin daily for three more weeks.      Follow-up Information    Brahmbhatt, Parag, MD. Schedule an appointment as soon as possible for a visit in 6 week(s).   Specialty:  Gastroenterology Contact information: [place] [address] [phone]  Swinteck, Aaron Edelman, MD. Schedule an appointment as soon as possible for a visit in 2 week(s).   Specialty:  Orthopedic Surgery Why:  For wound re-check Contact information: [address] [place] [address] [phone]        [facility] Follow up.   Specialty:  Home Health Services Why:  physical therapy Contact information: [address] [place] [address] [phone]        Theodis Blaze, MD Follow up.   Specialty:  Internal Medicine Why:  Please call me with questions [phone] Contact information: [address] [place] [address] [phone]            The results of significant diagnostics from this hospitalization (including imaging, microbiology, ancillary and laboratory) are listed below for reference.     Microbiology: Recent Results (from the past 240 hour(s))  Gastrointestinal Panel by PCR , Stool     Status: None   Collection Time: 08/14/17 12:29 PM  Result Value Ref Range Status   Campylobacter species NOT DETECTED NOT DETECTED Final   Plesimonas shigelloides NOT DETECTED NOT DETECTED Final   Salmonella species NOT DETECTED NOT DETECTED Final   Yersinia enterocolitica NOT DETECTED NOT DETECTED Final   Vibrio species NOT DETECTED NOT DETECTED Final   Vibrio cholerae NOT DETECTED NOT DETECTED Final   Enteroaggregative E coli (EAEC) NOT DETECTED NOT DETECTED Final   Enteropathogenic E coli (EPEC) NOT DETECTED NOT  DETECTED Final   Enterotoxigenic E coli (ETEC) NOT DETECTED NOT DETECTED Final   Shiga like toxin producing E coli (STEC) NOT DETECTED NOT DETECTED Final   Shigella/Enteroinvasive E coli (EIEC) NOT DETECTED NOT DETECTED Final   Cryptosporidium NOT DETECTED NOT DETECTED Final   Cyclospora cayetanensis NOT DETECTED NOT DETECTED Final   Entamoeba histolytica NOT DETECTED NOT DETECTED Final   Giardia lamblia NOT DETECTED NOT DETECTED Final   Adenovirus F40/41 NOT DETECTED NOT DETECTED Final   Astrovirus NOT DETECTED NOT DETECTED Final   Norovirus GI/GII NOT DETECTED NOT DETECTED Final   Rotavirus A NOT DETECTED NOT DETECTED Final   Sapovirus (I, II, IV, and V) NOT DETECTED NOT DETECTED Final    Comment: Performed at [facility], [address] [place], [place] [address]     Labs: Basic Metabolic Panel: Recent Labs  Lab 08/16/17 0530 08/17/17 0522 08/18/17 0530 08/19/17 0701 08/20/17 0552  NA 139 139 139 140 140  K 3.9 4.6 3.9 4.2 3.9  CL 107 106 111 108 108  CO2 25 25 24 26 24   GLUCOSE 105* 137* 96 105* 105*  BUN 21* 17 16 13 11   CREATININE 0.98 0.86 0.89 0.80 0.85  CALCIUM 8.3* 8.2* 7.9* 8.2* 8.0*   CBC: Recent Labs  Lab 08/13/17 1556  08/17/17 0522 08/18/17 0530 08/18/17 1319 08/19/17 0701 08/20/17 0552  WBC 7.2   < > 7.8 6.0 6.7 6.1 6.2  NEUTROABS 5.5  --   --   --  4.0  --   --   HGB 8.9*   < > 9.7* 8.3* 9.0* 8.7* 7.8*  HCT 26.8*   < > 30.2* 25.4* 27.8* 26.9* 24.1*  MCV 93.1   < > 89.3 90.4 91.4 90.6 90.6  PLT 255   < > 197 170 210 194 191   < > = values in this interval not displayed.    SIGNED: Time coordinating discharge: 60 minutes  Faye Ramsay, MD  [facility] 08/20/2017, 11:13 AM Pager [phone]  If 7PM-7AM, please contact night-coverage [email or web address] Password [email or web address]

## 2017-08-20 NOTE — Progress Notes (Signed)
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 Pt with R femoral neck fracture and underwent R DATHA on 08/16/17   OT comments  Performed toilet, shower transfers and LB dressing.  Pt moving well but needs rest breaks  Follow Up Recommendations  Supervision/Assistance - 24 hour;Home health OT    Equipment Recommendations  None recommended by OT    Recommendations for Other Services      Precautions / Restrictions Precautions Precautions: Fall;Knee Restrictions RLE Weight Bearing: Weight bearing as tolerated       Mobility Bed Mobility         Supine to sit: Min assist     General bed mobility comments: HOB raised; pt did use rail  Transfers   Equipment used: Rolling walker (2 wheeled)   Sit to Stand: Min guard         General transfer comment: for safety; cues for UE placement    Balance                                           ADL either performed or assessed with clinical judgement   ADL                       Lower Body Dressing: Maximal assistance;Sit to/from stand   Toilet Transfer: Min guard;Ambulation;RW;BSC   Toileting- Water quality scientist and Hygiene: Min guard;Sit to/from stand   Tub/ Shower Transfer: Walk-in shower;Minimal assistance;Ambulation;3 in 1     General ADL Comments: performed LB dressing while sitting on commode.  Tennis shoes are a tight fit and surface was high.  Pt wanted to walk back to bed for lower surface and return for walk in shower.  MD came in and pt rested at EOB for a few minutes.  Pt had some difficulty with RLE getting in and out of shower.  Showed sister how to assist leg if pt could not bring leg over lip     Vision       Perception     Praxis      Cognition  Arousal/Alertness: Awake/alert Behavior During Therapy: WFL for tasks assessed/performed Overall Cognitive Status: Within Functional Limits for tasks assessed                                          Exercises     Shoulder Instructions       General Comments      Pertinent Vitals/ Pain       Pain Score: 6  Pain Location: R groin cramping;  Pain Descriptors / Indicators: Aching;Discomfort;Sharp;Shooting Pain Intervention(s): Limited activity within patient's tolerance;Monitored during session;Premedicated before session;Repositioned;Ice applied  Home Living                                          Prior Functioning/Environment              Frequency           Progress Toward Goals  OT Goals(current goals can now be found in the care plan section)  Progress towards OT goals: Progressing toward goals     Plan      Co-evaluation                 AM-PAC PT "6 Clicks" Daily Activity     Outcome Measure   Help from another person eating meals?: None Help from another person taking care of personal grooming?: A Little Help from another person toileting, which includes using toliet, bedpan, or urinal?: A Little Help from another person bathing (including washing, rinsing, drying)?: A Lot Help from another person to put on and taking off regular upper body clothing?: A Little Help from another person to put on and taking off regular lower body clothing?: A Lot 6 Click Score: 17    End of Session    OT Visit Diagnosis: Unsteadiness on feet (R26.81);Muscle weakness (generalized) (M62.81);Pain Pain - Right/Left: Right Pain - part of body: Knee;Hip   Activity Tolerance Patient limited by fatigue   Patient Left in chair;with call bell/phone within reach;with family/visitor present   Nurse Communication          Time: 1478-2956 OT Time Calculation (min): 31 min  Charges: OT General Charges $OT Visit: 1 Visit OT  Treatments $Self Care/Home Management : 23-37 mins  Sandra [name], Sandra [name] [phone] 08/20/2017   Sandra [name] 08/20/2017, 12:25 PM

## 2017-08-20 NOTE — Progress Notes (Signed)
Patient discharged to home with family. Given all belongings, instructions, prescriptions. Verbalized understanding of all instructions. Escorted home via [place].

## 2017-08-21 DIAGNOSIS — Z96641 Presence of right artificial hip joint: Secondary | ICD-10-CM | POA: Diagnosis not present

## 2017-08-21 DIAGNOSIS — Z79891 Long term (current) use of opiate analgesic: Secondary | ICD-10-CM | POA: Diagnosis not present

## 2017-08-21 DIAGNOSIS — Z8614 Personal history of Methicillin resistant Staphylococcus aureus infection: Secondary | ICD-10-CM | POA: Diagnosis not present

## 2017-08-21 DIAGNOSIS — M489 Spondylopathy, unspecified: Secondary | ICD-10-CM | POA: Diagnosis not present

## 2017-08-21 DIAGNOSIS — G8929 Other chronic pain: Secondary | ICD-10-CM | POA: Diagnosis not present

## 2017-08-21 DIAGNOSIS — E669 Obesity, unspecified: Secondary | ICD-10-CM | POA: Diagnosis not present

## 2017-08-21 DIAGNOSIS — Z471 Aftercare following joint replacement surgery: Secondary | ICD-10-CM | POA: Diagnosis not present

## 2017-08-21 DIAGNOSIS — Z96651 Presence of right artificial knee joint: Secondary | ICD-10-CM | POA: Diagnosis not present

## 2017-08-21 DIAGNOSIS — S72091D Other fracture of head and neck of right femur, subsequent encounter for closed fracture with routine healing: Secondary | ICD-10-CM | POA: Diagnosis not present

## 2017-08-21 DIAGNOSIS — M8580 Other specified disorders of bone density and structure, unspecified site: Secondary | ICD-10-CM | POA: Diagnosis not present

## 2017-08-21 DIAGNOSIS — Z86718 Personal history of other venous thrombosis and embolism: Secondary | ICD-10-CM | POA: Diagnosis not present

## 2017-08-21 DIAGNOSIS — W19XXXD Unspecified fall, subsequent encounter: Secondary | ICD-10-CM | POA: Diagnosis not present

## 2017-08-21 DIAGNOSIS — Z7901 Long term (current) use of anticoagulants: Secondary | ICD-10-CM | POA: Diagnosis not present

## 2017-08-21 DIAGNOSIS — Z6830 Body mass index (BMI) 30.0-30.9, adult: Secondary | ICD-10-CM | POA: Diagnosis not present

## 2017-08-22 ENCOUNTER — Encounter (HOSPITAL_COMMUNITY): Payer: Self-pay | Admitting: Orthopedic Surgery

## 2017-08-23 DIAGNOSIS — M489 Spondylopathy, unspecified: Secondary | ICD-10-CM | POA: Diagnosis not present

## 2017-08-23 DIAGNOSIS — G8929 Other chronic pain: Secondary | ICD-10-CM | POA: Diagnosis not present

## 2017-08-23 DIAGNOSIS — S72091D Other fracture of head and neck of right femur, subsequent encounter for closed fracture with routine healing: Secondary | ICD-10-CM | POA: Diagnosis not present

## 2017-08-23 DIAGNOSIS — Z471 Aftercare following joint replacement surgery: Secondary | ICD-10-CM | POA: Diagnosis not present

## 2017-08-23 DIAGNOSIS — Z96641 Presence of right artificial hip joint: Secondary | ICD-10-CM | POA: Diagnosis not present

## 2017-08-23 DIAGNOSIS — Z96651 Presence of right artificial knee joint: Secondary | ICD-10-CM | POA: Diagnosis not present

## 2017-08-26 DIAGNOSIS — M489 Spondylopathy, unspecified: Secondary | ICD-10-CM | POA: Diagnosis not present

## 2017-08-26 DIAGNOSIS — S72091D Other fracture of head and neck of right femur, subsequent encounter for closed fracture with routine healing: Secondary | ICD-10-CM | POA: Diagnosis not present

## 2017-08-26 DIAGNOSIS — Z471 Aftercare following joint replacement surgery: Secondary | ICD-10-CM | POA: Diagnosis not present

## 2017-08-26 DIAGNOSIS — Z96651 Presence of right artificial knee joint: Secondary | ICD-10-CM | POA: Diagnosis not present

## 2017-08-26 DIAGNOSIS — Z96641 Presence of right artificial hip joint: Secondary | ICD-10-CM | POA: Diagnosis not present

## 2017-08-26 DIAGNOSIS — G8929 Other chronic pain: Secondary | ICD-10-CM | POA: Diagnosis not present

## 2017-08-28 DIAGNOSIS — G8929 Other chronic pain: Secondary | ICD-10-CM | POA: Diagnosis not present

## 2017-08-28 DIAGNOSIS — Z96641 Presence of right artificial hip joint: Secondary | ICD-10-CM | POA: Diagnosis not present

## 2017-08-28 DIAGNOSIS — S72091D Other fracture of head and neck of right femur, subsequent encounter for closed fracture with routine healing: Secondary | ICD-10-CM | POA: Diagnosis not present

## 2017-08-28 DIAGNOSIS — Z96651 Presence of right artificial knee joint: Secondary | ICD-10-CM | POA: Diagnosis not present

## 2017-08-28 DIAGNOSIS — M489 Spondylopathy, unspecified: Secondary | ICD-10-CM | POA: Diagnosis not present

## 2017-08-28 DIAGNOSIS — Z471 Aftercare following joint replacement surgery: Secondary | ICD-10-CM | POA: Diagnosis not present

## 2017-08-29 DIAGNOSIS — M489 Spondylopathy, unspecified: Secondary | ICD-10-CM | POA: Diagnosis not present

## 2017-08-29 DIAGNOSIS — Z471 Aftercare following joint replacement surgery: Secondary | ICD-10-CM | POA: Diagnosis not present

## 2017-08-29 DIAGNOSIS — G8929 Other chronic pain: Secondary | ICD-10-CM | POA: Diagnosis not present

## 2017-08-29 DIAGNOSIS — Z96641 Presence of right artificial hip joint: Secondary | ICD-10-CM | POA: Diagnosis not present

## 2017-08-29 DIAGNOSIS — Z96651 Presence of right artificial knee joint: Secondary | ICD-10-CM | POA: Diagnosis not present

## 2017-08-29 DIAGNOSIS — S72091D Other fracture of head and neck of right femur, subsequent encounter for closed fracture with routine healing: Secondary | ICD-10-CM | POA: Diagnosis not present

## 2017-08-30 DIAGNOSIS — Z471 Aftercare following joint replacement surgery: Secondary | ICD-10-CM | POA: Diagnosis not present

## 2017-08-30 DIAGNOSIS — S72001D Fracture of unspecified part of neck of right femur, subsequent encounter for closed fracture with routine healing: Secondary | ICD-10-CM | POA: Diagnosis not present

## 2017-08-30 DIAGNOSIS — Z96651 Presence of right artificial knee joint: Secondary | ICD-10-CM | POA: Diagnosis not present

## 2017-09-04 DIAGNOSIS — M25551 Pain in right hip: Secondary | ICD-10-CM | POA: Diagnosis not present

## 2017-09-06 DIAGNOSIS — M25561 Pain in right knee: Secondary | ICD-10-CM | POA: Diagnosis not present

## 2017-09-06 DIAGNOSIS — M25551 Pain in right hip: Secondary | ICD-10-CM | POA: Diagnosis not present

## 2017-09-09 DIAGNOSIS — M25561 Pain in right knee: Secondary | ICD-10-CM | POA: Diagnosis not present

## 2017-09-09 DIAGNOSIS — M25551 Pain in right hip: Secondary | ICD-10-CM | POA: Diagnosis not present

## 2017-09-11 DIAGNOSIS — M25551 Pain in right hip: Secondary | ICD-10-CM | POA: Diagnosis not present

## 2017-09-11 DIAGNOSIS — M25561 Pain in right knee: Secondary | ICD-10-CM | POA: Diagnosis not present

## 2017-09-13 DIAGNOSIS — M25561 Pain in right knee: Secondary | ICD-10-CM | POA: Diagnosis not present

## 2017-09-13 DIAGNOSIS — M25551 Pain in right hip: Secondary | ICD-10-CM | POA: Diagnosis not present

## 2017-09-16 DIAGNOSIS — M25561 Pain in right knee: Secondary | ICD-10-CM | POA: Diagnosis not present

## 2017-09-16 DIAGNOSIS — M25551 Pain in right hip: Secondary | ICD-10-CM | POA: Diagnosis not present

## 2017-09-18 DIAGNOSIS — M25561 Pain in right knee: Secondary | ICD-10-CM | POA: Diagnosis not present

## 2017-09-18 DIAGNOSIS — M25559 Pain in unspecified hip: Secondary | ICD-10-CM | POA: Diagnosis not present

## 2017-09-20 DIAGNOSIS — M25551 Pain in right hip: Secondary | ICD-10-CM | POA: Diagnosis not present

## 2017-09-20 DIAGNOSIS — M25561 Pain in right knee: Secondary | ICD-10-CM | POA: Diagnosis not present

## 2017-09-23 DIAGNOSIS — M25551 Pain in right hip: Secondary | ICD-10-CM | POA: Diagnosis not present

## 2017-09-23 DIAGNOSIS — M25561 Pain in right knee: Secondary | ICD-10-CM | POA: Diagnosis not present

## 2017-09-25 DIAGNOSIS — M25551 Pain in right hip: Secondary | ICD-10-CM | POA: Diagnosis not present

## 2017-09-25 DIAGNOSIS — M25561 Pain in right knee: Secondary | ICD-10-CM | POA: Diagnosis not present

## 2017-09-27 DIAGNOSIS — M25561 Pain in right knee: Secondary | ICD-10-CM | POA: Diagnosis not present

## 2017-09-27 DIAGNOSIS — M25551 Pain in right hip: Secondary | ICD-10-CM | POA: Diagnosis not present

## 2017-09-30 DIAGNOSIS — Z96651 Presence of right artificial knee joint: Secondary | ICD-10-CM | POA: Diagnosis not present

## 2017-09-30 DIAGNOSIS — Z471 Aftercare following joint replacement surgery: Secondary | ICD-10-CM | POA: Diagnosis not present

## 2017-10-02 DIAGNOSIS — M25551 Pain in right hip: Secondary | ICD-10-CM | POA: Diagnosis not present

## 2017-10-02 DIAGNOSIS — M25561 Pain in right knee: Secondary | ICD-10-CM | POA: Diagnosis not present

## 2017-10-04 DIAGNOSIS — D500 Iron deficiency anemia secondary to blood loss (chronic): Secondary | ICD-10-CM | POA: Diagnosis not present

## 2017-10-04 DIAGNOSIS — M25551 Pain in right hip: Secondary | ICD-10-CM | POA: Diagnosis not present

## 2017-10-04 DIAGNOSIS — Z79899 Other long term (current) drug therapy: Secondary | ICD-10-CM | POA: Diagnosis not present

## 2017-10-04 DIAGNOSIS — M25561 Pain in right knee: Secondary | ICD-10-CM | POA: Diagnosis not present

## 2017-10-07 DIAGNOSIS — M25561 Pain in right knee: Secondary | ICD-10-CM | POA: Diagnosis not present

## 2017-10-07 DIAGNOSIS — M25551 Pain in right hip: Secondary | ICD-10-CM | POA: Diagnosis not present

## 2017-10-09 DIAGNOSIS — M25551 Pain in right hip: Secondary | ICD-10-CM | POA: Diagnosis not present

## 2017-10-09 DIAGNOSIS — M25561 Pain in right knee: Secondary | ICD-10-CM | POA: Diagnosis not present

## 2017-10-11 DIAGNOSIS — M25561 Pain in right knee: Secondary | ICD-10-CM | POA: Diagnosis not present

## 2017-10-11 DIAGNOSIS — D649 Anemia, unspecified: Secondary | ICD-10-CM | POA: Diagnosis not present

## 2017-10-11 DIAGNOSIS — Z8719 Personal history of other diseases of the digestive system: Secondary | ICD-10-CM | POA: Diagnosis not present

## 2017-10-11 DIAGNOSIS — M25551 Pain in right hip: Secondary | ICD-10-CM | POA: Diagnosis not present

## 2017-10-14 DIAGNOSIS — M25561 Pain in right knee: Secondary | ICD-10-CM | POA: Diagnosis not present

## 2017-10-14 DIAGNOSIS — M25551 Pain in right hip: Secondary | ICD-10-CM | POA: Diagnosis not present

## 2017-10-16 DIAGNOSIS — M25551 Pain in right hip: Secondary | ICD-10-CM | POA: Diagnosis not present

## 2017-10-16 DIAGNOSIS — M25561 Pain in right knee: Secondary | ICD-10-CM | POA: Diagnosis not present

## 2017-10-22 DIAGNOSIS — M25561 Pain in right knee: Secondary | ICD-10-CM | POA: Diagnosis not present

## 2017-10-22 DIAGNOSIS — M25551 Pain in right hip: Secondary | ICD-10-CM | POA: Diagnosis not present

## 2017-10-24 DIAGNOSIS — M25561 Pain in right knee: Secondary | ICD-10-CM | POA: Diagnosis not present

## 2017-10-24 DIAGNOSIS — M25551 Pain in right hip: Secondary | ICD-10-CM | POA: Diagnosis not present

## 2017-10-28 DIAGNOSIS — M25571 Pain in right ankle and joints of right foot: Secondary | ICD-10-CM | POA: Diagnosis not present

## 2017-10-28 DIAGNOSIS — M67871 Other specified disorders of synovium, right ankle and foot: Secondary | ICD-10-CM | POA: Diagnosis not present

## 2017-10-28 DIAGNOSIS — M67879 Other specified disorders of synovium and tendon, unspecified ankle and foot: Secondary | ICD-10-CM | POA: Diagnosis not present

## 2017-10-29 DIAGNOSIS — M25561 Pain in right knee: Secondary | ICD-10-CM | POA: Diagnosis not present

## 2017-10-29 DIAGNOSIS — M25551 Pain in right hip: Secondary | ICD-10-CM | POA: Diagnosis not present

## 2017-10-31 DIAGNOSIS — M25561 Pain in right knee: Secondary | ICD-10-CM | POA: Diagnosis not present

## 2017-10-31 DIAGNOSIS — M25551 Pain in right hip: Secondary | ICD-10-CM | POA: Diagnosis not present

## 2017-11-07 DIAGNOSIS — M67879 Other specified disorders of synovium and tendon, unspecified ankle and foot: Secondary | ICD-10-CM | POA: Diagnosis not present

## 2017-11-08 DIAGNOSIS — M542 Cervicalgia: Secondary | ICD-10-CM | POA: Diagnosis not present

## 2017-11-08 DIAGNOSIS — Z6829 Body mass index (BMI) 29.0-29.9, adult: Secondary | ICD-10-CM | POA: Diagnosis not present

## 2017-11-08 DIAGNOSIS — M47816 Spondylosis without myelopathy or radiculopathy, lumbar region: Secondary | ICD-10-CM | POA: Diagnosis not present

## 2017-12-13 DIAGNOSIS — D500 Iron deficiency anemia secondary to blood loss (chronic): Secondary | ICD-10-CM | POA: Diagnosis not present

## 2017-12-30 DIAGNOSIS — K2950 Unspecified chronic gastritis without bleeding: Secondary | ICD-10-CM | POA: Diagnosis not present

## 2017-12-30 DIAGNOSIS — K259 Gastric ulcer, unspecified as acute or chronic, without hemorrhage or perforation: Secondary | ICD-10-CM | POA: Diagnosis not present

## 2018-01-16 DIAGNOSIS — Z471 Aftercare following joint replacement surgery: Secondary | ICD-10-CM | POA: Diagnosis not present

## 2018-01-16 DIAGNOSIS — Z96651 Presence of right artificial knee joint: Secondary | ICD-10-CM | POA: Diagnosis not present

## 2018-01-16 DIAGNOSIS — M1711 Unilateral primary osteoarthritis, right knee: Secondary | ICD-10-CM | POA: Diagnosis not present

## 2018-02-14 DIAGNOSIS — M542 Cervicalgia: Secondary | ICD-10-CM | POA: Diagnosis not present

## 2018-02-14 DIAGNOSIS — R030 Elevated blood-pressure reading, without diagnosis of hypertension: Secondary | ICD-10-CM | POA: Diagnosis not present

## 2018-02-14 DIAGNOSIS — Z6830 Body mass index (BMI) 30.0-30.9, adult: Secondary | ICD-10-CM | POA: Diagnosis not present

## 2018-02-14 DIAGNOSIS — M47816 Spondylosis without myelopathy or radiculopathy, lumbar region: Secondary | ICD-10-CM | POA: Diagnosis not present

## 2018-02-14 DIAGNOSIS — S72001D Fracture of unspecified part of neck of right femur, subsequent encounter for closed fracture with routine healing: Secondary | ICD-10-CM | POA: Diagnosis not present

## 2018-03-21 DIAGNOSIS — M961 Postlaminectomy syndrome, not elsewhere classified: Secondary | ICD-10-CM | POA: Diagnosis not present

## 2018-03-21 DIAGNOSIS — N183 Chronic kidney disease, stage 3 (moderate): Secondary | ICD-10-CM | POA: Diagnosis not present

## 2018-03-21 DIAGNOSIS — L298 Other pruritus: Secondary | ICD-10-CM | POA: Diagnosis not present

## 2018-03-21 DIAGNOSIS — M810 Age-related osteoporosis without current pathological fracture: Secondary | ICD-10-CM | POA: Diagnosis not present

## 2018-03-21 DIAGNOSIS — E7800 Pure hypercholesterolemia, unspecified: Secondary | ICD-10-CM | POA: Diagnosis not present

## 2018-03-21 DIAGNOSIS — D692 Other nonthrombocytopenic purpura: Secondary | ICD-10-CM | POA: Diagnosis not present

## 2018-03-21 DIAGNOSIS — E559 Vitamin D deficiency, unspecified: Secondary | ICD-10-CM | POA: Diagnosis not present

## 2018-03-21 DIAGNOSIS — Z0000 Encounter for general adult medical examination without abnormal findings: Secondary | ICD-10-CM | POA: Diagnosis not present

## 2018-03-21 DIAGNOSIS — Z79899 Other long term (current) drug therapy: Secondary | ICD-10-CM | POA: Diagnosis not present

## 2018-03-21 DIAGNOSIS — R32 Unspecified urinary incontinence: Secondary | ICD-10-CM | POA: Diagnosis not present

## 2018-03-21 DIAGNOSIS — M48061 Spinal stenosis, lumbar region without neurogenic claudication: Secondary | ICD-10-CM | POA: Diagnosis not present

## 2018-03-21 DIAGNOSIS — L409 Psoriasis, unspecified: Secondary | ICD-10-CM | POA: Diagnosis not present

## 2018-04-18 ENCOUNTER — Other Ambulatory Visit: Payer: Self-pay | Admitting: Family Medicine

## 2018-04-18 DIAGNOSIS — Z1231 Encounter for screening mammogram for malignant neoplasm of breast: Secondary | ICD-10-CM

## 2018-05-15 ENCOUNTER — Other Ambulatory Visit: Payer: Self-pay | Admitting: Family Medicine

## 2018-05-15 DIAGNOSIS — E2839 Other primary ovarian failure: Secondary | ICD-10-CM

## 2018-05-16 DIAGNOSIS — M47816 Spondylosis without myelopathy or radiculopathy, lumbar region: Secondary | ICD-10-CM | POA: Diagnosis not present

## 2018-05-16 DIAGNOSIS — I10 Essential (primary) hypertension: Secondary | ICD-10-CM | POA: Diagnosis not present

## 2018-05-16 DIAGNOSIS — M542 Cervicalgia: Secondary | ICD-10-CM | POA: Diagnosis not present

## 2018-05-16 DIAGNOSIS — Z6831 Body mass index (BMI) 31.0-31.9, adult: Secondary | ICD-10-CM | POA: Diagnosis not present

## 2018-05-19 DIAGNOSIS — M25562 Pain in left knee: Secondary | ICD-10-CM | POA: Diagnosis not present

## 2018-05-19 DIAGNOSIS — M1712 Unilateral primary osteoarthritis, left knee: Secondary | ICD-10-CM | POA: Diagnosis not present

## 2018-06-06 ENCOUNTER — Ambulatory Visit: Payer: Medicare Other

## 2018-06-27 ENCOUNTER — Ambulatory Visit
Admission: RE | Admit: 2018-06-27 | Discharge: 2018-06-27 | Disposition: A | Discharge status: Home / Self Care | Payer: Medicare Other | Source: Ambulatory Visit | Attending: Family Medicine | Admitting: Family Medicine

## 2018-06-27 DIAGNOSIS — Z1231 Encounter for screening mammogram for malignant neoplasm of breast: Secondary | ICD-10-CM | POA: Diagnosis not present

## 2018-06-27 DIAGNOSIS — E2839 Other primary ovarian failure: Secondary | ICD-10-CM

## 2018-06-27 DIAGNOSIS — M8589 Other specified disorders of bone density and structure, multiple sites: Secondary | ICD-10-CM | POA: Diagnosis not present

## 2018-06-27 DIAGNOSIS — Z780 Asymptomatic menopausal state: Secondary | ICD-10-CM | POA: Diagnosis not present

## 2018-07-07 DIAGNOSIS — M1712 Unilateral primary osteoarthritis, left knee: Secondary | ICD-10-CM | POA: Diagnosis not present

## 2018-07-11 DIAGNOSIS — Z96651 Presence of right artificial knee joint: Secondary | ICD-10-CM | POA: Diagnosis not present

## 2018-07-11 DIAGNOSIS — Z471 Aftercare following joint replacement surgery: Secondary | ICD-10-CM | POA: Diagnosis not present

## 2018-08-01 DIAGNOSIS — H2513 Age-related nuclear cataract, bilateral: Secondary | ICD-10-CM | POA: Diagnosis not present

## 2018-08-05 DIAGNOSIS — M1712 Unilateral primary osteoarthritis, left knee: Secondary | ICD-10-CM | POA: Diagnosis not present

## 2018-08-12 DIAGNOSIS — M1712 Unilateral primary osteoarthritis, left knee: Secondary | ICD-10-CM | POA: Diagnosis not present

## 2018-08-19 DIAGNOSIS — M1712 Unilateral primary osteoarthritis, left knee: Secondary | ICD-10-CM | POA: Diagnosis not present

## 2018-08-20 DIAGNOSIS — Z6832 Body mass index (BMI) 32.0-32.9, adult: Secondary | ICD-10-CM | POA: Diagnosis not present

## 2018-08-20 DIAGNOSIS — R030 Elevated blood-pressure reading, without diagnosis of hypertension: Secondary | ICD-10-CM | POA: Diagnosis not present

## 2018-08-20 DIAGNOSIS — M542 Cervicalgia: Secondary | ICD-10-CM | POA: Diagnosis not present

## 2018-08-20 DIAGNOSIS — M47816 Spondylosis without myelopathy or radiculopathy, lumbar region: Secondary | ICD-10-CM | POA: Diagnosis not present

## 2018-09-02 DIAGNOSIS — J0100 Acute maxillary sinusitis, unspecified: Secondary | ICD-10-CM | POA: Diagnosis not present

## 2018-11-13 DIAGNOSIS — M5416 Radiculopathy, lumbar region: Secondary | ICD-10-CM | POA: Diagnosis not present

## 2018-11-13 DIAGNOSIS — M961 Postlaminectomy syndrome, not elsewhere classified: Secondary | ICD-10-CM | POA: Diagnosis not present

## 2018-11-13 DIAGNOSIS — M4120 Other idiopathic scoliosis, site unspecified: Secondary | ICD-10-CM | POA: Diagnosis not present

## 2019-01-28 IMAGING — MG DIGITAL SCREENING BILATERAL MAMMOGRAM WITH TOMO AND CAD
8 series · 8 of 24 positions shown · non-contrast
Comparison: Previous exam(s).

CLINICAL DATA: Screening.

EXAM:
DIGITAL SCREENING BILATERAL MAMMOGRAM WITH TOMO AND CAD

[L CC synth-2D]
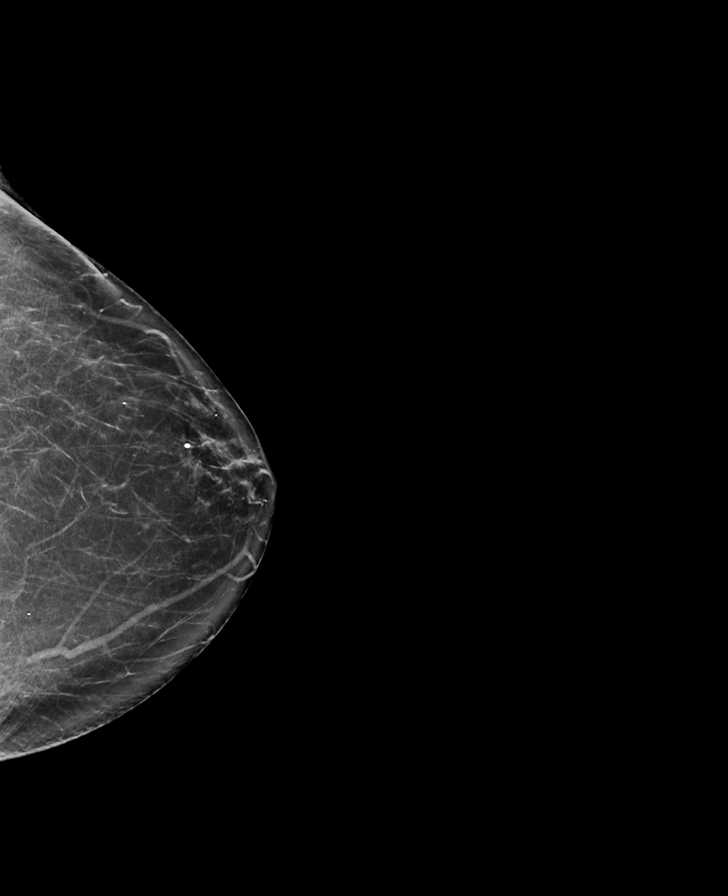

[L MLO synth-2D]
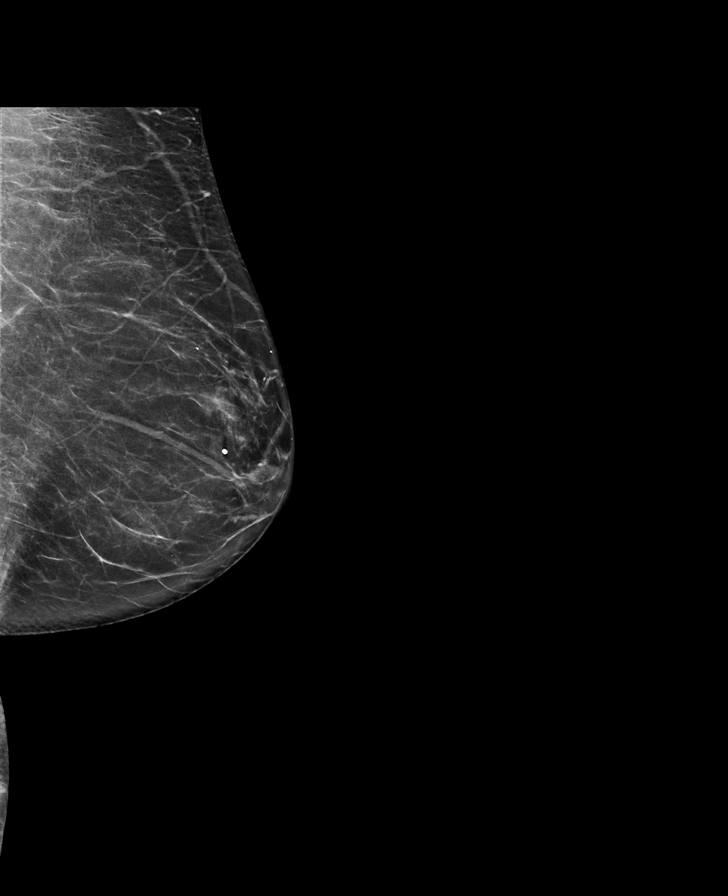

[R CC synth-2D]
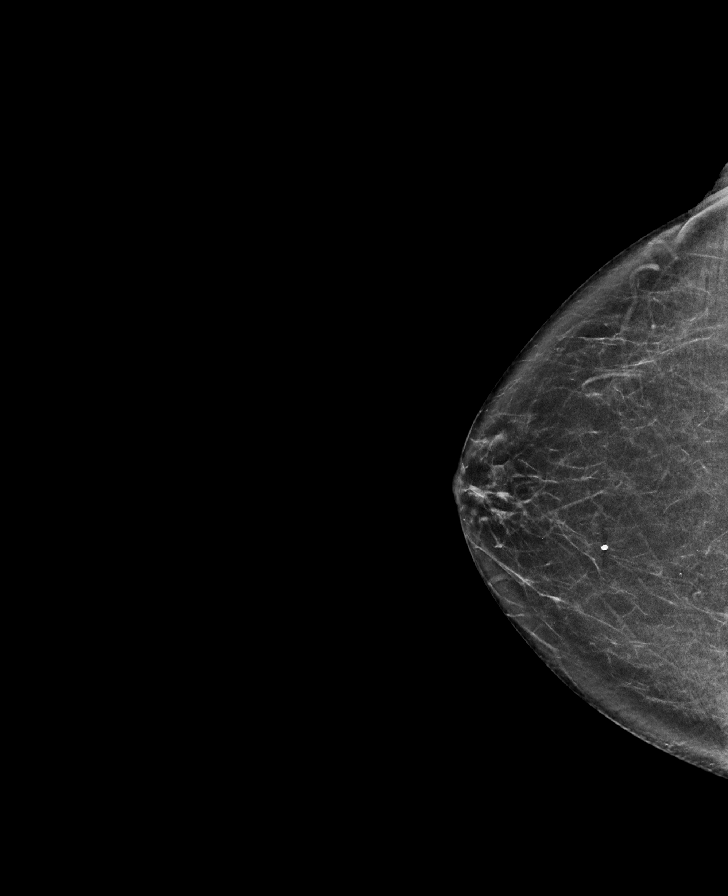

[R MLO synth-2D]
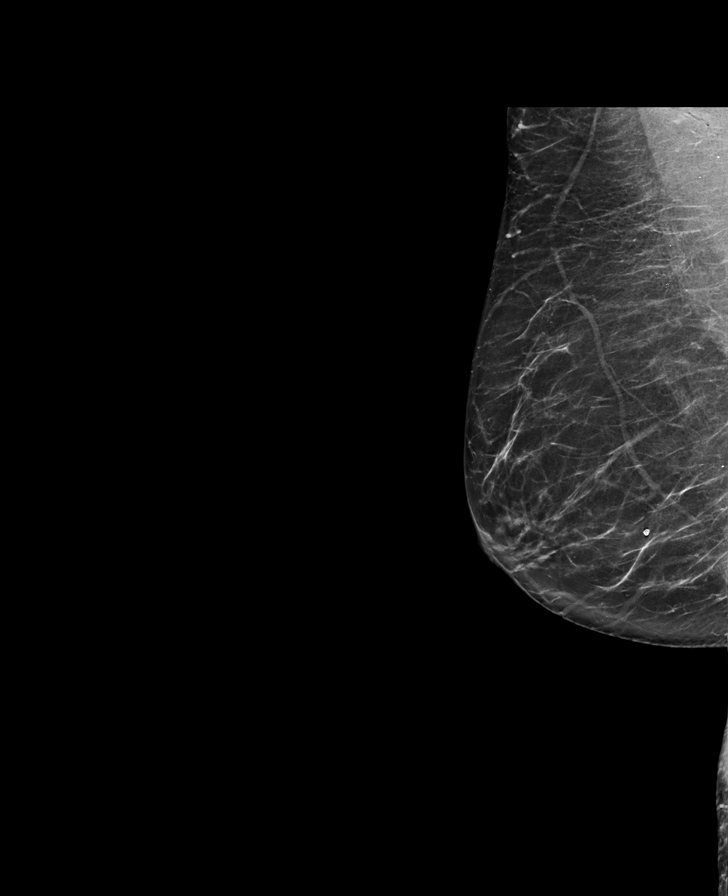

[R CC tomo · tomo slice 35/69.0]
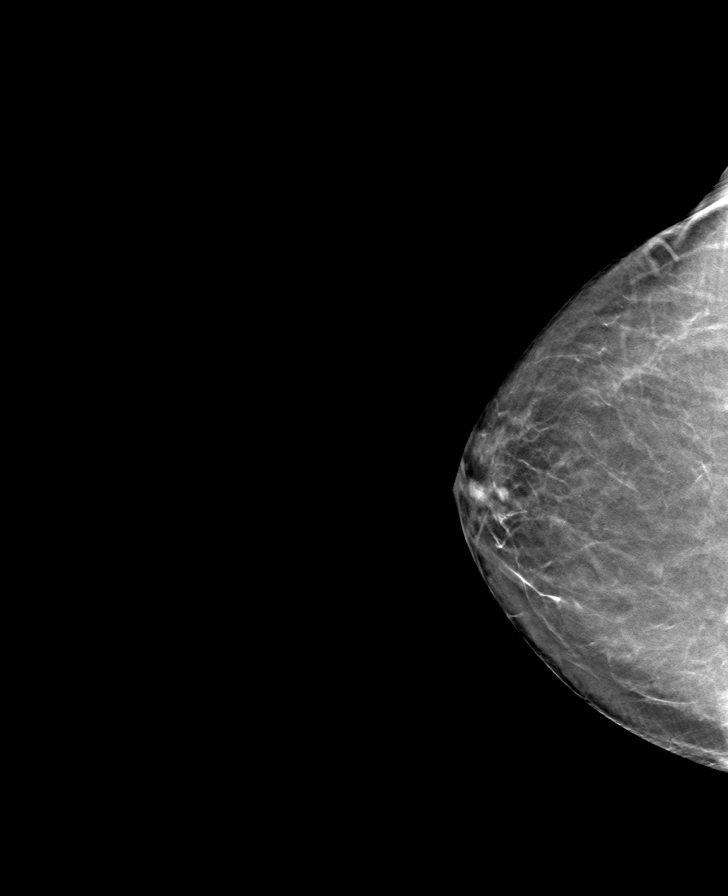

[R MLO tomo · tomo slice 36/71.0]
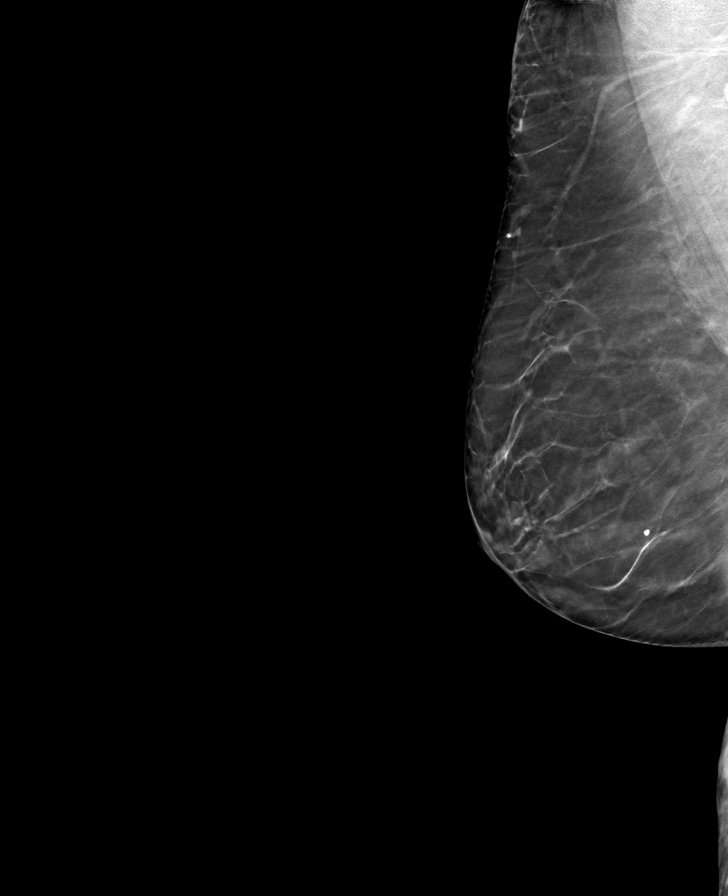

[L MLO tomo · tomo slice 35/68.0]
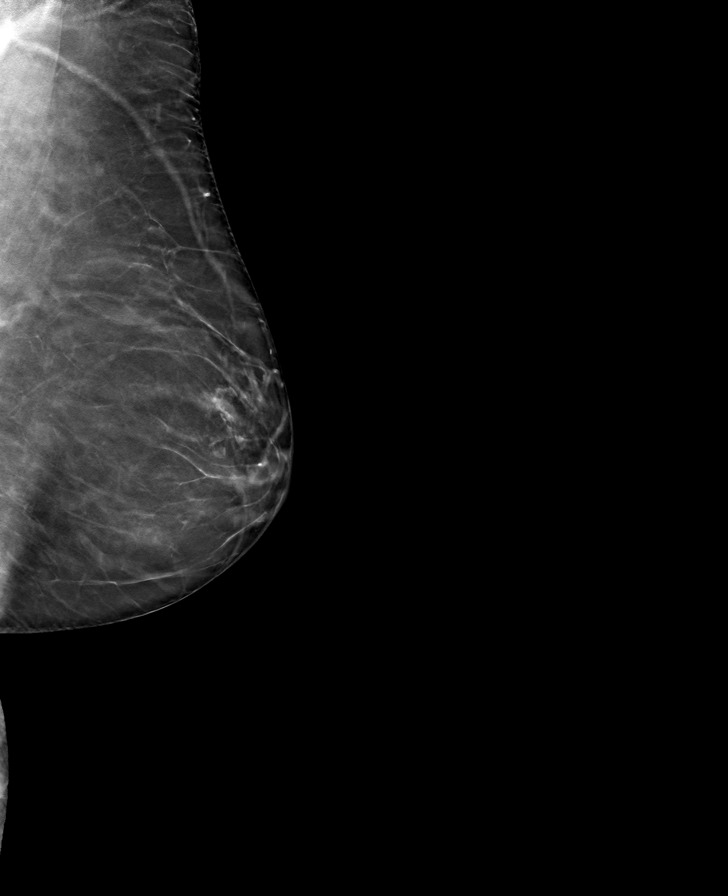

[L CC tomo · tomo slice 37/73.0]
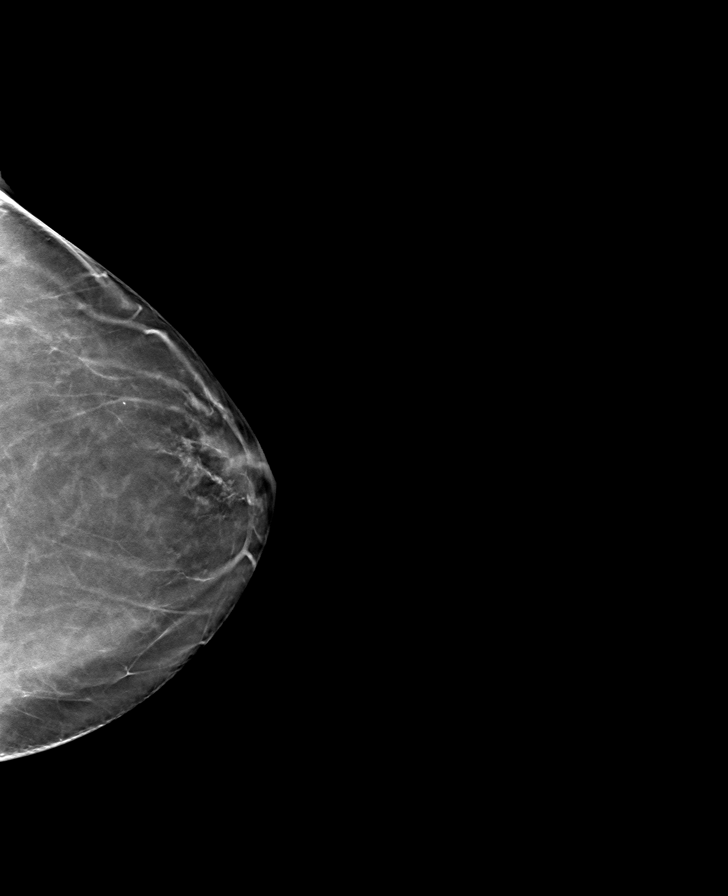

[8 of 24 positions shown; findings below may reference images not displayed]

ACR Breast Density Category b: There are scattered areas of
fibroglandular density.
FINDINGS: There are no findings suspicious for malignancy. Images were
processed with CAD.
IMPRESSION: No mammographic evidence of malignancy. A result letter of this
screening mammogram will be mailed directly to the patient.

RECOMMENDATION:
Screening mammogram in one year. (Code:[id number])

BI-RADS CATEGORY  1: Negative.

## 2019-02-12 DIAGNOSIS — M961 Postlaminectomy syndrome, not elsewhere classified: Secondary | ICD-10-CM | POA: Diagnosis not present

## 2019-02-12 DIAGNOSIS — M5416 Radiculopathy, lumbar region: Secondary | ICD-10-CM | POA: Diagnosis not present

## 2019-04-01 DIAGNOSIS — M8588 Other specified disorders of bone density and structure, other site: Secondary | ICD-10-CM | POA: Diagnosis not present

## 2019-04-01 DIAGNOSIS — E7800 Pure hypercholesterolemia, unspecified: Secondary | ICD-10-CM | POA: Diagnosis not present

## 2019-04-01 DIAGNOSIS — E559 Vitamin D deficiency, unspecified: Secondary | ICD-10-CM | POA: Diagnosis not present

## 2019-04-01 DIAGNOSIS — N183 Chronic kidney disease, stage 3 (moderate): Secondary | ICD-10-CM | POA: Diagnosis not present

## 2019-04-01 DIAGNOSIS — Z23 Encounter for immunization: Secondary | ICD-10-CM | POA: Diagnosis not present

## 2019-04-01 DIAGNOSIS — Z79899 Other long term (current) drug therapy: Secondary | ICD-10-CM | POA: Diagnosis not present

## 2019-04-01 DIAGNOSIS — M810 Age-related osteoporosis without current pathological fracture: Secondary | ICD-10-CM | POA: Diagnosis not present

## 2019-04-01 DIAGNOSIS — M25519 Pain in unspecified shoulder: Secondary | ICD-10-CM | POA: Diagnosis not present

## 2019-04-01 DIAGNOSIS — Z0000 Encounter for general adult medical examination without abnormal findings: Secondary | ICD-10-CM | POA: Diagnosis not present

## 2019-04-01 DIAGNOSIS — L409 Psoriasis, unspecified: Secondary | ICD-10-CM | POA: Diagnosis not present

## 2019-04-01 DIAGNOSIS — M48061 Spinal stenosis, lumbar region without neurogenic claudication: Secondary | ICD-10-CM | POA: Diagnosis not present

## 2019-04-01 DIAGNOSIS — K635 Polyp of colon: Secondary | ICD-10-CM | POA: Diagnosis not present

## 2019-05-14 DIAGNOSIS — M5416 Radiculopathy, lumbar region: Secondary | ICD-10-CM | POA: Diagnosis not present

## 2019-05-14 DIAGNOSIS — R030 Elevated blood-pressure reading, without diagnosis of hypertension: Secondary | ICD-10-CM | POA: Diagnosis not present

## 2019-05-14 DIAGNOSIS — M961 Postlaminectomy syndrome, not elsewhere classified: Secondary | ICD-10-CM | POA: Diagnosis not present

## 2019-05-14 DIAGNOSIS — Z6832 Body mass index (BMI) 32.0-32.9, adult: Secondary | ICD-10-CM | POA: Diagnosis not present

## 2019-05-14 DIAGNOSIS — M4120 Other idiopathic scoliosis, site unspecified: Secondary | ICD-10-CM | POA: Diagnosis not present

## 2019-06-02 ENCOUNTER — Other Ambulatory Visit: Payer: Self-pay | Admitting: Family Medicine

## 2019-06-02 DIAGNOSIS — Z1231 Encounter for screening mammogram for malignant neoplasm of breast: Secondary | ICD-10-CM

## 2019-07-23 ENCOUNTER — Ambulatory Visit: Payer: Medicare Other

## 2019-08-10 ENCOUNTER — Ambulatory Visit: Payer: Medicare Other

## 2019-08-27 ENCOUNTER — Ambulatory Visit: Payer: Medicare Other

## 2019-08-28 ENCOUNTER — Ambulatory Visit: Payer: Medicare Other | Attending: Internal Medicine

## 2019-08-28 DIAGNOSIS — Z23 Encounter for immunization: Secondary | ICD-10-CM

## 2019-08-28 NOTE — Progress Notes (Signed)
   Covid-19 Vaccination Clinic  Name:  Sandra [name]    MRN: [id number] DOB: [birth year]  08/28/2019  [name] was observed post Covid-19 immunization for 15 minutes without incidence. She was provided with Vaccine Information Sheet and instruction to access the V-Safe system.   [name] was instructed to call 911 with any severe reactions post vaccine: Marland Kitchen Difficulty breathing  . Swelling of your face and throat  . A fast heartbeat  . A bad rash all over your body  . Dizziness and weakness    Immunizations Administered    Name Date Dose VIS Date Route   Pfizer COVID-19 Vaccine 08/28/2019  8:55 AM 0.3 mL 06/12/2019 Intramuscular   Manufacturer: [place]   Lot: [id number]   [place]: [id number]

## 2019-09-02 DIAGNOSIS — M961 Postlaminectomy syndrome, not elsewhere classified: Secondary | ICD-10-CM | POA: Diagnosis not present

## 2019-09-02 DIAGNOSIS — M5416 Radiculopathy, lumbar region: Secondary | ICD-10-CM | POA: Diagnosis not present

## 2019-09-23 ENCOUNTER — Ambulatory Visit: Payer: Medicare Other | Attending: Internal Medicine

## 2019-09-23 DIAGNOSIS — Z23 Encounter for immunization: Secondary | ICD-10-CM

## 2019-09-23 NOTE — Progress Notes (Signed)
   Covid-19 Vaccination Clinic  Name:  Sandra [name]    MRN: [id number] DOB: 03-03-1941  09/23/2019  Sandra [name] was observed post Covid-19 immunization for 15 minutes without incident. She was provided with Vaccine Information Sheet and instruction to access the V-Safe system.   Sandra [name] was instructed to call 911 with any severe reactions post vaccine: Marland Kitchen Difficulty breathing  . Swelling of face and throat  . A fast heartbeat  . A bad rash all over body  . Dizziness and weakness   Immunizations Administered    Name Date Dose VIS Date Route   Pfizer COVID-19 Vaccine 09/23/2019  8:22 AM 0.3 mL 06/12/2019 Intramuscular   Manufacturer: [place]   Lot: [id number]   [place]: [id number]

## 2019-10-05 ENCOUNTER — Ambulatory Visit: Payer: Medicare Other

## 2019-11-04 DIAGNOSIS — H0288A Meibomian gland dysfunction right eye, upper and lower eyelids: Secondary | ICD-10-CM | POA: Diagnosis not present

## 2019-11-04 DIAGNOSIS — H0288B Meibomian gland dysfunction left eye, upper and lower eyelids: Secondary | ICD-10-CM | POA: Diagnosis not present

## 2019-11-06 ENCOUNTER — Ambulatory Visit: Payer: Medicare Other

## 2019-11-20 ENCOUNTER — Ambulatory Visit
Admission: RE | Admit: 2019-11-20 | Discharge: 2019-11-20 | Disposition: A | Discharge status: Home / Self Care | Payer: Medicare Other | Source: Ambulatory Visit | Attending: Family Medicine | Admitting: Family Medicine

## 2019-11-20 ENCOUNTER — Other Ambulatory Visit: Payer: Self-pay | Admitting: Internal Medicine

## 2019-11-20 ENCOUNTER — Other Ambulatory Visit: Payer: Self-pay

## 2019-11-20 DIAGNOSIS — Z1231 Encounter for screening mammogram for malignant neoplasm of breast: Secondary | ICD-10-CM

## 2019-12-07 DIAGNOSIS — M5416 Radiculopathy, lumbar region: Secondary | ICD-10-CM | POA: Diagnosis not present

## 2019-12-07 DIAGNOSIS — M961 Postlaminectomy syndrome, not elsewhere classified: Secondary | ICD-10-CM | POA: Diagnosis not present

## 2020-03-03 DIAGNOSIS — M5416 Radiculopathy, lumbar region: Secondary | ICD-10-CM | POA: Diagnosis not present

## 2020-03-03 DIAGNOSIS — M961 Postlaminectomy syndrome, not elsewhere classified: Secondary | ICD-10-CM | POA: Diagnosis not present

## 2020-04-04 DIAGNOSIS — M47816 Spondylosis without myelopathy or radiculopathy, lumbar region: Secondary | ICD-10-CM | POA: Diagnosis not present

## 2020-04-04 DIAGNOSIS — Z86718 Personal history of other venous thrombosis and embolism: Secondary | ICD-10-CM | POA: Diagnosis not present

## 2020-04-04 DIAGNOSIS — Z720 Tobacco use: Secondary | ICD-10-CM | POA: Diagnosis not present

## 2020-04-04 DIAGNOSIS — Z23 Encounter for immunization: Secondary | ICD-10-CM | POA: Diagnosis not present

## 2020-04-04 DIAGNOSIS — G894 Chronic pain syndrome: Secondary | ICD-10-CM | POA: Diagnosis not present

## 2020-04-04 DIAGNOSIS — Z0000 Encounter for general adult medical examination without abnormal findings: Secondary | ICD-10-CM | POA: Diagnosis not present

## 2020-04-04 DIAGNOSIS — E673 Hypervitaminosis D: Secondary | ICD-10-CM | POA: Diagnosis not present

## 2020-04-04 DIAGNOSIS — M810 Age-related osteoporosis without current pathological fracture: Secondary | ICD-10-CM | POA: Diagnosis not present

## 2020-04-04 DIAGNOSIS — N183 Chronic kidney disease, stage 3 unspecified: Secondary | ICD-10-CM | POA: Diagnosis not present

## 2020-04-04 DIAGNOSIS — E7800 Pure hypercholesterolemia, unspecified: Secondary | ICD-10-CM | POA: Diagnosis not present

## 2020-04-04 DIAGNOSIS — F320 Major depressive disorder, single episode, mild: Secondary | ICD-10-CM | POA: Diagnosis not present

## 2020-04-26 DIAGNOSIS — M25532 Pain in left wrist: Secondary | ICD-10-CM | POA: Diagnosis not present

## 2020-04-26 DIAGNOSIS — M25531 Pain in right wrist: Secondary | ICD-10-CM | POA: Diagnosis not present

## 2020-06-02 DIAGNOSIS — M961 Postlaminectomy syndrome, not elsewhere classified: Secondary | ICD-10-CM | POA: Diagnosis not present

## 2020-06-02 DIAGNOSIS — M5416 Radiculopathy, lumbar region: Secondary | ICD-10-CM | POA: Diagnosis not present

## 2020-06-07 DIAGNOSIS — M25531 Pain in right wrist: Secondary | ICD-10-CM | POA: Diagnosis not present

## 2020-06-07 DIAGNOSIS — M25532 Pain in left wrist: Secondary | ICD-10-CM | POA: Diagnosis not present

## 2020-06-07 DIAGNOSIS — M79641 Pain in right hand: Secondary | ICD-10-CM | POA: Diagnosis not present

## 2020-06-07 DIAGNOSIS — M79642 Pain in left hand: Secondary | ICD-10-CM | POA: Diagnosis not present

## 2020-06-29 DIAGNOSIS — Z23 Encounter for immunization: Secondary | ICD-10-CM | POA: Diagnosis not present

## 2020-07-11 DIAGNOSIS — G8918 Other acute postprocedural pain: Secondary | ICD-10-CM | POA: Diagnosis not present

## 2020-07-11 DIAGNOSIS — M19031 Primary osteoarthritis, right wrist: Secondary | ICD-10-CM | POA: Diagnosis not present

## 2020-07-11 DIAGNOSIS — M1811 Unilateral primary osteoarthritis of first carpometacarpal joint, right hand: Secondary | ICD-10-CM | POA: Diagnosis not present

## 2020-07-11 DIAGNOSIS — G5601 Carpal tunnel syndrome, right upper limb: Secondary | ICD-10-CM | POA: Diagnosis not present

## 2020-07-22 DIAGNOSIS — M79644 Pain in right finger(s): Secondary | ICD-10-CM | POA: Diagnosis not present

## 2020-07-22 DIAGNOSIS — M25532 Pain in left wrist: Secondary | ICD-10-CM | POA: Diagnosis not present

## 2020-07-22 DIAGNOSIS — M79642 Pain in left hand: Secondary | ICD-10-CM | POA: Diagnosis not present

## 2020-07-22 DIAGNOSIS — Z4789 Encounter for other orthopedic aftercare: Secondary | ICD-10-CM | POA: Diagnosis not present

## 2020-07-22 DIAGNOSIS — M79641 Pain in right hand: Secondary | ICD-10-CM | POA: Diagnosis not present

## 2020-08-02 DIAGNOSIS — M79644 Pain in right finger(s): Secondary | ICD-10-CM | POA: Diagnosis not present

## 2020-08-09 DIAGNOSIS — M79644 Pain in right finger(s): Secondary | ICD-10-CM | POA: Diagnosis not present

## 2020-08-16 DIAGNOSIS — M79644 Pain in right finger(s): Secondary | ICD-10-CM | POA: Diagnosis not present

## 2020-08-23 DIAGNOSIS — M79644 Pain in right finger(s): Secondary | ICD-10-CM | POA: Diagnosis not present

## 2020-08-23 DIAGNOSIS — M25511 Pain in right shoulder: Secondary | ICD-10-CM | POA: Diagnosis not present

## 2020-08-25 DIAGNOSIS — M5416 Radiculopathy, lumbar region: Secondary | ICD-10-CM | POA: Diagnosis not present

## 2020-08-25 DIAGNOSIS — M961 Postlaminectomy syndrome, not elsewhere classified: Secondary | ICD-10-CM | POA: Diagnosis not present

## 2020-08-30 DIAGNOSIS — M79644 Pain in right finger(s): Secondary | ICD-10-CM | POA: Diagnosis not present

## 2020-09-02 DIAGNOSIS — M79644 Pain in right finger(s): Secondary | ICD-10-CM | POA: Diagnosis not present

## 2020-09-06 DIAGNOSIS — M79644 Pain in right finger(s): Secondary | ICD-10-CM | POA: Diagnosis not present

## 2020-09-09 DIAGNOSIS — M79644 Pain in right finger(s): Secondary | ICD-10-CM | POA: Diagnosis not present

## 2020-09-13 DIAGNOSIS — M79644 Pain in right finger(s): Secondary | ICD-10-CM | POA: Diagnosis not present

## 2020-09-20 DIAGNOSIS — M79644 Pain in right finger(s): Secondary | ICD-10-CM | POA: Diagnosis not present

## 2020-09-27 DIAGNOSIS — M79644 Pain in right finger(s): Secondary | ICD-10-CM | POA: Diagnosis not present

## 2020-10-04 DIAGNOSIS — M79644 Pain in right finger(s): Secondary | ICD-10-CM | POA: Diagnosis not present

## 2020-10-04 DIAGNOSIS — M25511 Pain in right shoulder: Secondary | ICD-10-CM | POA: Diagnosis not present

## 2020-10-04 DIAGNOSIS — Z4789 Encounter for other orthopedic aftercare: Secondary | ICD-10-CM | POA: Diagnosis not present

## 2020-10-12 DIAGNOSIS — N183 Chronic kidney disease, stage 3 unspecified: Secondary | ICD-10-CM | POA: Diagnosis not present

## 2020-10-12 DIAGNOSIS — G894 Chronic pain syndrome: Secondary | ICD-10-CM | POA: Diagnosis not present

## 2020-10-12 DIAGNOSIS — M810 Age-related osteoporosis without current pathological fracture: Secondary | ICD-10-CM | POA: Diagnosis not present

## 2020-10-17 ENCOUNTER — Other Ambulatory Visit: Payer: Self-pay | Admitting: Internal Medicine

## 2020-10-17 DIAGNOSIS — M810 Age-related osteoporosis without current pathological fracture: Secondary | ICD-10-CM

## 2020-10-20 ENCOUNTER — Other Ambulatory Visit: Payer: Self-pay | Admitting: Internal Medicine

## 2020-10-20 DIAGNOSIS — Z1231 Encounter for screening mammogram for malignant neoplasm of breast: Secondary | ICD-10-CM

## 2020-10-28 DIAGNOSIS — M25511 Pain in right shoulder: Secondary | ICD-10-CM | POA: Diagnosis not present

## 2020-11-03 DIAGNOSIS — M5416 Radiculopathy, lumbar region: Secondary | ICD-10-CM | POA: Diagnosis not present

## 2020-11-03 DIAGNOSIS — M4120 Other idiopathic scoliosis, site unspecified: Secondary | ICD-10-CM | POA: Diagnosis not present

## 2020-11-03 DIAGNOSIS — M961 Postlaminectomy syndrome, not elsewhere classified: Secondary | ICD-10-CM | POA: Diagnosis not present

## 2020-11-03 DIAGNOSIS — M542 Cervicalgia: Secondary | ICD-10-CM | POA: Diagnosis not present

## 2020-11-09 DIAGNOSIS — M25511 Pain in right shoulder: Secondary | ICD-10-CM | POA: Diagnosis not present

## 2020-11-17 DIAGNOSIS — M810 Age-related osteoporosis without current pathological fracture: Secondary | ICD-10-CM | POA: Diagnosis not present

## 2020-11-17 DIAGNOSIS — Z780 Asymptomatic menopausal state: Secondary | ICD-10-CM | POA: Diagnosis not present

## 2020-11-17 DIAGNOSIS — E559 Vitamin D deficiency, unspecified: Secondary | ICD-10-CM | POA: Diagnosis not present

## 2020-11-17 DIAGNOSIS — R5383 Other fatigue: Secondary | ICD-10-CM | POA: Diagnosis not present

## 2020-11-17 DIAGNOSIS — M8589 Other specified disorders of bone density and structure, multiple sites: Secondary | ICD-10-CM | POA: Diagnosis not present

## 2020-11-25 DIAGNOSIS — M75101 Unspecified rotator cuff tear or rupture of right shoulder, not specified as traumatic: Secondary | ICD-10-CM | POA: Diagnosis not present

## 2020-11-26 DIAGNOSIS — Z23 Encounter for immunization: Secondary | ICD-10-CM | POA: Diagnosis not present

## 2020-12-08 ENCOUNTER — Ambulatory Visit
Admission: RE | Admit: 2020-12-08 | Discharge: 2020-12-08 | Disposition: A | Discharge status: Home / Self Care | Payer: Medicare Other | Source: Ambulatory Visit | Attending: Internal Medicine | Admitting: Internal Medicine

## 2020-12-08 ENCOUNTER — Other Ambulatory Visit: Payer: Self-pay

## 2020-12-08 DIAGNOSIS — Z1231 Encounter for screening mammogram for malignant neoplasm of breast: Secondary | ICD-10-CM

## 2020-12-15 DIAGNOSIS — M810 Age-related osteoporosis without current pathological fracture: Secondary | ICD-10-CM | POA: Diagnosis not present

## 2020-12-15 DIAGNOSIS — E559 Vitamin D deficiency, unspecified: Secondary | ICD-10-CM | POA: Diagnosis not present

## 2020-12-23 DIAGNOSIS — M76822 Posterior tibial tendinitis, left leg: Secondary | ICD-10-CM | POA: Diagnosis not present

## 2020-12-23 DIAGNOSIS — M79672 Pain in left foot: Secondary | ICD-10-CM | POA: Diagnosis not present

## 2020-12-23 DIAGNOSIS — M2140 Flat foot [pes planus] (acquired), unspecified foot: Secondary | ICD-10-CM | POA: Diagnosis not present

## 2021-01-17 DIAGNOSIS — M76822 Posterior tibial tendinitis, left leg: Secondary | ICD-10-CM | POA: Diagnosis not present

## 2021-01-17 DIAGNOSIS — M19072 Primary osteoarthritis, left ankle and foot: Secondary | ICD-10-CM | POA: Diagnosis not present

## 2021-01-17 DIAGNOSIS — M79672 Pain in left foot: Secondary | ICD-10-CM | POA: Diagnosis not present

## 2021-01-17 DIAGNOSIS — M2140 Flat foot [pes planus] (acquired), unspecified foot: Secondary | ICD-10-CM | POA: Diagnosis not present

## 2021-02-10 DIAGNOSIS — Z6835 Body mass index (BMI) 35.0-35.9, adult: Secondary | ICD-10-CM | POA: Diagnosis not present

## 2021-02-10 DIAGNOSIS — M792 Neuralgia and neuritis, unspecified: Secondary | ICD-10-CM | POA: Diagnosis not present

## 2021-02-10 DIAGNOSIS — M542 Cervicalgia: Secondary | ICD-10-CM | POA: Diagnosis not present

## 2021-02-10 DIAGNOSIS — M4120 Other idiopathic scoliosis, site unspecified: Secondary | ICD-10-CM | POA: Diagnosis not present

## 2021-02-22 DIAGNOSIS — M75101 Unspecified rotator cuff tear or rupture of right shoulder, not specified as traumatic: Secondary | ICD-10-CM | POA: Diagnosis not present

## 2021-02-28 DIAGNOSIS — M76822 Posterior tibial tendinitis, left leg: Secondary | ICD-10-CM | POA: Diagnosis not present

## 2021-02-28 DIAGNOSIS — M79672 Pain in left foot: Secondary | ICD-10-CM | POA: Diagnosis not present

## 2021-02-28 DIAGNOSIS — M19072 Primary osteoarthritis, left ankle and foot: Secondary | ICD-10-CM | POA: Diagnosis not present

## 2021-02-28 DIAGNOSIS — M2140 Flat foot [pes planus] (acquired), unspecified foot: Secondary | ICD-10-CM | POA: Diagnosis not present

## 2021-03-29 DIAGNOSIS — M76821 Posterior tibial tendinitis, right leg: Secondary | ICD-10-CM | POA: Diagnosis not present

## 2021-03-29 DIAGNOSIS — M792 Neuralgia and neuritis, unspecified: Secondary | ICD-10-CM | POA: Diagnosis not present

## 2021-03-29 DIAGNOSIS — M76822 Posterior tibial tendinitis, left leg: Secondary | ICD-10-CM | POA: Diagnosis not present

## 2021-03-29 DIAGNOSIS — G609 Hereditary and idiopathic neuropathy, unspecified: Secondary | ICD-10-CM | POA: Diagnosis not present

## 2021-03-29 DIAGNOSIS — M19079 Primary osteoarthritis, unspecified ankle and foot: Secondary | ICD-10-CM | POA: Diagnosis not present

## 2021-04-05 DIAGNOSIS — M810 Age-related osteoporosis without current pathological fracture: Secondary | ICD-10-CM | POA: Diagnosis not present

## 2021-04-05 DIAGNOSIS — R7309 Other abnormal glucose: Secondary | ICD-10-CM | POA: Diagnosis not present

## 2021-04-05 DIAGNOSIS — Z1389 Encounter for screening for other disorder: Secondary | ICD-10-CM | POA: Diagnosis not present

## 2021-04-05 DIAGNOSIS — E7800 Pure hypercholesterolemia, unspecified: Secondary | ICD-10-CM | POA: Diagnosis not present

## 2021-04-05 DIAGNOSIS — E559 Vitamin D deficiency, unspecified: Secondary | ICD-10-CM | POA: Diagnosis not present

## 2021-04-05 DIAGNOSIS — D649 Anemia, unspecified: Secondary | ICD-10-CM | POA: Diagnosis not present

## 2021-04-05 DIAGNOSIS — N183 Chronic kidney disease, stage 3 unspecified: Secondary | ICD-10-CM | POA: Diagnosis not present

## 2021-04-05 DIAGNOSIS — K635 Polyp of colon: Secondary | ICD-10-CM | POA: Diagnosis not present

## 2021-04-05 DIAGNOSIS — Z23 Encounter for immunization: Secondary | ICD-10-CM | POA: Diagnosis not present

## 2021-04-05 DIAGNOSIS — Z0000 Encounter for general adult medical examination without abnormal findings: Secondary | ICD-10-CM | POA: Diagnosis not present

## 2021-04-05 DIAGNOSIS — R739 Hyperglycemia, unspecified: Secondary | ICD-10-CM | POA: Diagnosis not present

## 2021-04-07 ENCOUNTER — Ambulatory Visit
Admission: RE | Admit: 2021-04-07 | Discharge: 2021-04-07 | Disposition: A | Discharge status: Home / Self Care | Payer: Medicare Other | Source: Ambulatory Visit | Attending: Internal Medicine | Admitting: Internal Medicine

## 2021-04-07 ENCOUNTER — Other Ambulatory Visit: Payer: Self-pay

## 2021-04-07 DIAGNOSIS — M810 Age-related osteoporosis without current pathological fracture: Secondary | ICD-10-CM

## 2021-04-07 DIAGNOSIS — M8589 Other specified disorders of bone density and structure, multiple sites: Secondary | ICD-10-CM | POA: Diagnosis not present

## 2021-04-07 DIAGNOSIS — Z780 Asymptomatic menopausal state: Secondary | ICD-10-CM | POA: Diagnosis not present

## 2021-04-19 DIAGNOSIS — M76821 Posterior tibial tendinitis, right leg: Secondary | ICD-10-CM | POA: Diagnosis not present

## 2021-04-19 DIAGNOSIS — M76822 Posterior tibial tendinitis, left leg: Secondary | ICD-10-CM | POA: Diagnosis not present

## 2021-04-19 DIAGNOSIS — M19079 Primary osteoarthritis, unspecified ankle and foot: Secondary | ICD-10-CM | POA: Diagnosis not present

## 2021-05-03 DIAGNOSIS — M4120 Other idiopathic scoliosis, site unspecified: Secondary | ICD-10-CM | POA: Diagnosis not present

## 2021-05-03 DIAGNOSIS — M792 Neuralgia and neuritis, unspecified: Secondary | ICD-10-CM | POA: Diagnosis not present

## 2021-05-03 DIAGNOSIS — M542 Cervicalgia: Secondary | ICD-10-CM | POA: Diagnosis not present

## 2021-05-31 DIAGNOSIS — H2513 Age-related nuclear cataract, bilateral: Secondary | ICD-10-CM | POA: Diagnosis not present

## 2021-07-12 DIAGNOSIS — N183 Chronic kidney disease, stage 3 unspecified: Secondary | ICD-10-CM | POA: Diagnosis not present

## 2021-07-12 DIAGNOSIS — E7800 Pure hypercholesterolemia, unspecified: Secondary | ICD-10-CM | POA: Diagnosis not present

## 2021-07-12 DIAGNOSIS — Z6836 Body mass index (BMI) 36.0-36.9, adult: Secondary | ICD-10-CM | POA: Diagnosis not present

## 2021-09-04 DIAGNOSIS — M14672 Charcot's joint, left ankle and foot: Secondary | ICD-10-CM | POA: Diagnosis not present

## 2021-09-04 DIAGNOSIS — M14671 Charcot's joint, right ankle and foot: Secondary | ICD-10-CM | POA: Diagnosis not present

## 2021-09-04 DIAGNOSIS — M792 Neuralgia and neuritis, unspecified: Secondary | ICD-10-CM | POA: Diagnosis not present

## 2021-09-04 DIAGNOSIS — M19079 Primary osteoarthritis, unspecified ankle and foot: Secondary | ICD-10-CM | POA: Diagnosis not present

## 2021-09-04 DIAGNOSIS — M76822 Posterior tibial tendinitis, left leg: Secondary | ICD-10-CM | POA: Diagnosis not present

## 2021-09-04 DIAGNOSIS — G609 Hereditary and idiopathic neuropathy, unspecified: Secondary | ICD-10-CM | POA: Diagnosis not present

## 2021-09-04 DIAGNOSIS — M76821 Posterior tibial tendinitis, right leg: Secondary | ICD-10-CM | POA: Diagnosis not present

## 2021-09-05 DIAGNOSIS — M75101 Unspecified rotator cuff tear or rupture of right shoulder, not specified as traumatic: Secondary | ICD-10-CM | POA: Diagnosis not present

## 2021-09-20 DIAGNOSIS — H6123 Impacted cerumen, bilateral: Secondary | ICD-10-CM | POA: Diagnosis not present

## 2021-09-20 DIAGNOSIS — R410 Disorientation, unspecified: Secondary | ICD-10-CM | POA: Diagnosis not present

## 2021-09-22 DIAGNOSIS — R410 Disorientation, unspecified: Secondary | ICD-10-CM | POA: Diagnosis not present

## 2021-10-23 DIAGNOSIS — N1831 Chronic kidney disease, stage 3a: Secondary | ICD-10-CM | POA: Diagnosis not present

## 2021-10-23 DIAGNOSIS — N390 Urinary tract infection, site not specified: Secondary | ICD-10-CM | POA: Diagnosis not present

## 2021-10-23 DIAGNOSIS — G894 Chronic pain syndrome: Secondary | ICD-10-CM | POA: Diagnosis not present

## 2021-10-23 DIAGNOSIS — R410 Disorientation, unspecified: Secondary | ICD-10-CM | POA: Diagnosis not present

## 2021-10-23 DIAGNOSIS — D692 Other nonthrombocytopenic purpura: Secondary | ICD-10-CM | POA: Diagnosis not present

## 2021-10-24 DIAGNOSIS — N390 Urinary tract infection, site not specified: Secondary | ICD-10-CM | POA: Diagnosis not present

## 2021-11-30 DIAGNOSIS — Z8711 Personal history of peptic ulcer disease: Secondary | ICD-10-CM | POA: Diagnosis not present

## 2021-11-30 DIAGNOSIS — G609 Hereditary and idiopathic neuropathy, unspecified: Secondary | ICD-10-CM | POA: Diagnosis not present

## 2021-11-30 DIAGNOSIS — R7309 Other abnormal glucose: Secondary | ICD-10-CM | POA: Diagnosis not present

## 2021-11-30 DIAGNOSIS — Z8781 Personal history of (healed) traumatic fracture: Secondary | ICD-10-CM | POA: Diagnosis not present

## 2021-11-30 DIAGNOSIS — Z86718 Personal history of other venous thrombosis and embolism: Secondary | ICD-10-CM | POA: Diagnosis not present

## 2021-11-30 DIAGNOSIS — I700 Atherosclerosis of aorta: Secondary | ICD-10-CM | POA: Diagnosis not present

## 2021-11-30 DIAGNOSIS — R109 Unspecified abdominal pain: Secondary | ICD-10-CM | POA: Diagnosis not present

## 2021-11-30 DIAGNOSIS — R5383 Other fatigue: Secondary | ICD-10-CM | POA: Diagnosis not present

## 2021-11-30 DIAGNOSIS — M8589 Other specified disorders of bone density and structure, multiple sites: Secondary | ICD-10-CM | POA: Diagnosis not present

## 2021-11-30 DIAGNOSIS — Z0000 Encounter for general adult medical examination without abnormal findings: Secondary | ICD-10-CM | POA: Diagnosis not present

## 2021-11-30 DIAGNOSIS — R197 Diarrhea, unspecified: Secondary | ICD-10-CM | POA: Diagnosis not present

## 2021-11-30 DIAGNOSIS — N1831 Chronic kidney disease, stage 3a: Secondary | ICD-10-CM | POA: Diagnosis not present

## 2021-11-30 DIAGNOSIS — M545 Low back pain, unspecified: Secondary | ICD-10-CM | POA: Diagnosis not present

## 2021-12-04 DIAGNOSIS — Z86718 Personal history of other venous thrombosis and embolism: Secondary | ICD-10-CM | POA: Diagnosis not present

## 2021-12-04 DIAGNOSIS — I700 Atherosclerosis of aorta: Secondary | ICD-10-CM | POA: Diagnosis not present

## 2021-12-04 DIAGNOSIS — N1831 Chronic kidney disease, stage 3a: Secondary | ICD-10-CM | POA: Diagnosis not present

## 2021-12-04 DIAGNOSIS — G609 Hereditary and idiopathic neuropathy, unspecified: Secondary | ICD-10-CM | POA: Diagnosis not present

## 2021-12-04 DIAGNOSIS — Z8781 Personal history of (healed) traumatic fracture: Secondary | ICD-10-CM | POA: Diagnosis not present

## 2021-12-04 DIAGNOSIS — Z0000 Encounter for general adult medical examination without abnormal findings: Secondary | ICD-10-CM | POA: Diagnosis not present

## 2021-12-04 DIAGNOSIS — R7309 Other abnormal glucose: Secondary | ICD-10-CM | POA: Diagnosis not present

## 2021-12-04 DIAGNOSIS — Z8711 Personal history of peptic ulcer disease: Secondary | ICD-10-CM | POA: Diagnosis not present

## 2021-12-04 DIAGNOSIS — M545 Low back pain, unspecified: Secondary | ICD-10-CM | POA: Diagnosis not present

## 2021-12-04 DIAGNOSIS — M8589 Other specified disorders of bone density and structure, multiple sites: Secondary | ICD-10-CM | POA: Diagnosis not present

## 2021-12-05 ENCOUNTER — Other Ambulatory Visit: Payer: Self-pay | Admitting: Internal Medicine

## 2021-12-05 DIAGNOSIS — K859 Acute pancreatitis without necrosis or infection, unspecified: Secondary | ICD-10-CM

## 2021-12-05 DIAGNOSIS — R109 Unspecified abdominal pain: Secondary | ICD-10-CM

## 2021-12-06 DIAGNOSIS — M792 Neuralgia and neuritis, unspecified: Secondary | ICD-10-CM | POA: Diagnosis not present

## 2021-12-06 DIAGNOSIS — M14671 Charcot's joint, right ankle and foot: Secondary | ICD-10-CM | POA: Diagnosis not present

## 2021-12-06 DIAGNOSIS — G609 Hereditary and idiopathic neuropathy, unspecified: Secondary | ICD-10-CM | POA: Diagnosis not present

## 2021-12-06 DIAGNOSIS — L84 Corns and callosities: Secondary | ICD-10-CM | POA: Diagnosis not present

## 2021-12-06 DIAGNOSIS — K859 Acute pancreatitis without necrosis or infection, unspecified: Secondary | ICD-10-CM | POA: Diagnosis not present

## 2021-12-06 DIAGNOSIS — M76821 Posterior tibial tendinitis, right leg: Secondary | ICD-10-CM | POA: Diagnosis not present

## 2021-12-06 DIAGNOSIS — I700 Atherosclerosis of aorta: Secondary | ICD-10-CM | POA: Diagnosis not present

## 2021-12-06 DIAGNOSIS — M76822 Posterior tibial tendinitis, left leg: Secondary | ICD-10-CM | POA: Diagnosis not present

## 2021-12-06 DIAGNOSIS — M19079 Primary osteoarthritis, unspecified ankle and foot: Secondary | ICD-10-CM | POA: Diagnosis not present

## 2021-12-06 DIAGNOSIS — M14672 Charcot's joint, left ankle and foot: Secondary | ICD-10-CM | POA: Diagnosis not present

## 2021-12-06 DIAGNOSIS — N281 Cyst of kidney, acquired: Secondary | ICD-10-CM | POA: Diagnosis not present

## 2021-12-14 DIAGNOSIS — R600 Localized edema: Secondary | ICD-10-CM | POA: Diagnosis not present

## 2021-12-21 ENCOUNTER — Other Ambulatory Visit: Payer: Medicare Other

## 2022-01-11 DIAGNOSIS — H10521 Angular blepharoconjunctivitis, right eye: Secondary | ICD-10-CM | POA: Diagnosis not present

## 2022-02-06 DIAGNOSIS — N1831 Chronic kidney disease, stage 3a: Secondary | ICD-10-CM | POA: Diagnosis not present

## 2022-02-06 DIAGNOSIS — R5383 Other fatigue: Secondary | ICD-10-CM | POA: Diagnosis not present

## 2022-02-06 DIAGNOSIS — I872 Venous insufficiency (chronic) (peripheral): Secondary | ICD-10-CM | POA: Diagnosis not present

## 2022-02-06 DIAGNOSIS — R609 Edema, unspecified: Secondary | ICD-10-CM | POA: Diagnosis not present

## 2022-02-13 DIAGNOSIS — G3184 Mild cognitive impairment, so stated: Secondary | ICD-10-CM | POA: Diagnosis not present

## 2022-03-09 DIAGNOSIS — B351 Tinea unguium: Secondary | ICD-10-CM | POA: Diagnosis not present

## 2022-03-09 DIAGNOSIS — M19079 Primary osteoarthritis, unspecified ankle and foot: Secondary | ICD-10-CM | POA: Diagnosis not present

## 2022-03-09 DIAGNOSIS — M14671 Charcot's joint, right ankle and foot: Secondary | ICD-10-CM | POA: Diagnosis not present

## 2022-03-09 DIAGNOSIS — M792 Neuralgia and neuritis, unspecified: Secondary | ICD-10-CM | POA: Diagnosis not present

## 2022-03-09 DIAGNOSIS — L84 Corns and callosities: Secondary | ICD-10-CM | POA: Diagnosis not present

## 2022-03-09 DIAGNOSIS — I739 Peripheral vascular disease, unspecified: Secondary | ICD-10-CM | POA: Diagnosis not present

## 2022-03-09 DIAGNOSIS — M14672 Charcot's joint, left ankle and foot: Secondary | ICD-10-CM | POA: Diagnosis not present

## 2022-03-09 DIAGNOSIS — M76822 Posterior tibial tendinitis, left leg: Secondary | ICD-10-CM | POA: Diagnosis not present

## 2022-03-09 DIAGNOSIS — G609 Hereditary and idiopathic neuropathy, unspecified: Secondary | ICD-10-CM | POA: Diagnosis not present

## 2022-03-09 DIAGNOSIS — M76821 Posterior tibial tendinitis, right leg: Secondary | ICD-10-CM | POA: Diagnosis not present

## 2022-03-14 DIAGNOSIS — G3184 Mild cognitive impairment, so stated: Secondary | ICD-10-CM | POA: Diagnosis not present

## 2022-03-15 ENCOUNTER — Other Ambulatory Visit: Payer: Self-pay | Admitting: Family Medicine

## 2022-03-15 DIAGNOSIS — G3184 Mild cognitive impairment, so stated: Secondary | ICD-10-CM

## 2022-03-15 DIAGNOSIS — F09 Unspecified mental disorder due to known physiological condition: Secondary | ICD-10-CM

## 2022-03-16 DIAGNOSIS — G609 Hereditary and idiopathic neuropathy, unspecified: Secondary | ICD-10-CM | POA: Diagnosis not present

## 2022-03-16 DIAGNOSIS — R5383 Other fatigue: Secondary | ICD-10-CM | POA: Diagnosis not present

## 2022-03-16 DIAGNOSIS — R109 Unspecified abdominal pain: Secondary | ICD-10-CM | POA: Diagnosis not present

## 2022-03-16 DIAGNOSIS — R413 Other amnesia: Secondary | ICD-10-CM | POA: Diagnosis not present

## 2022-03-16 DIAGNOSIS — R197 Diarrhea, unspecified: Secondary | ICD-10-CM | POA: Diagnosis not present

## 2022-03-16 DIAGNOSIS — R7303 Prediabetes: Secondary | ICD-10-CM | POA: Diagnosis not present

## 2022-03-16 DIAGNOSIS — G3184 Mild cognitive impairment, so stated: Secondary | ICD-10-CM | POA: Diagnosis not present

## 2022-03-16 DIAGNOSIS — I872 Venous insufficiency (chronic) (peripheral): Secondary | ICD-10-CM | POA: Diagnosis not present

## 2022-03-16 DIAGNOSIS — M8589 Other specified disorders of bone density and structure, multiple sites: Secondary | ICD-10-CM | POA: Diagnosis not present

## 2022-03-16 DIAGNOSIS — N1831 Chronic kidney disease, stage 3a: Secondary | ICD-10-CM | POA: Diagnosis not present

## 2022-03-16 DIAGNOSIS — R609 Edema, unspecified: Secondary | ICD-10-CM | POA: Diagnosis not present

## 2022-04-09 DIAGNOSIS — Z471 Aftercare following joint replacement surgery: Secondary | ICD-10-CM | POA: Diagnosis not present

## 2022-04-09 DIAGNOSIS — M7061 Trochanteric bursitis, right hip: Secondary | ICD-10-CM | POA: Diagnosis not present

## 2022-04-12 ENCOUNTER — Ambulatory Visit
Admission: RE | Admit: 2022-04-12 | Discharge: 2022-04-12 | Disposition: A | Discharge status: Home / Self Care | Payer: Medicare Other | Source: Ambulatory Visit | Attending: Family Medicine | Admitting: Family Medicine

## 2022-04-12 DIAGNOSIS — R262 Difficulty in walking, not elsewhere classified: Secondary | ICD-10-CM | POA: Diagnosis not present

## 2022-04-12 DIAGNOSIS — G3184 Mild cognitive impairment, so stated: Secondary | ICD-10-CM

## 2022-04-12 DIAGNOSIS — R410 Disorientation, unspecified: Secondary | ICD-10-CM | POA: Diagnosis not present

## 2022-04-12 DIAGNOSIS — I6782 Cerebral ischemia: Secondary | ICD-10-CM | POA: Diagnosis not present

## 2022-04-12 DIAGNOSIS — F09 Unspecified mental disorder due to known physiological condition: Secondary | ICD-10-CM

## 2022-04-20 DIAGNOSIS — G3184 Mild cognitive impairment, so stated: Secondary | ICD-10-CM | POA: Diagnosis not present

## 2022-04-24 DIAGNOSIS — M7061 Trochanteric bursitis, right hip: Secondary | ICD-10-CM | POA: Diagnosis not present

## 2022-05-03 DIAGNOSIS — M7061 Trochanteric bursitis, right hip: Secondary | ICD-10-CM | POA: Diagnosis not present

## 2022-05-04 DIAGNOSIS — G3184 Mild cognitive impairment, so stated: Secondary | ICD-10-CM | POA: Diagnosis not present

## 2022-05-04 DIAGNOSIS — E559 Vitamin D deficiency, unspecified: Secondary | ICD-10-CM | POA: Diagnosis not present

## 2022-05-08 DIAGNOSIS — M7061 Trochanteric bursitis, right hip: Secondary | ICD-10-CM | POA: Diagnosis not present

## 2022-06-01 DIAGNOSIS — R7303 Prediabetes: Secondary | ICD-10-CM | POA: Diagnosis not present

## 2022-06-01 DIAGNOSIS — F03A0 Unspecified dementia, mild, without behavioral disturbance, psychotic disturbance, mood disturbance, and anxiety: Secondary | ICD-10-CM | POA: Diagnosis not present

## 2022-06-01 DIAGNOSIS — Z6838 Body mass index (BMI) 38.0-38.9, adult: Secondary | ICD-10-CM | POA: Diagnosis not present

## 2022-06-01 DIAGNOSIS — M8580 Other specified disorders of bone density and structure, unspecified site: Secondary | ICD-10-CM | POA: Diagnosis not present

## 2022-07-27 ENCOUNTER — Other Ambulatory Visit: Payer: Self-pay | Admitting: Geriatric Medicine

## 2022-07-27 DIAGNOSIS — Z122 Encounter for screening for malignant neoplasm of respiratory organs: Secondary | ICD-10-CM

## 2022-07-27 DIAGNOSIS — Z87891 Personal history of nicotine dependence: Secondary | ICD-10-CM

## 2023-01-21 ENCOUNTER — Emergency Department (HOSPITAL_COMMUNITY)
Admission: EM | Admit: 2023-01-21 | Discharge: 2023-01-21 | Disposition: A | Discharge status: Home / Self Care | Payer: Medicare Other | Attending: Emergency Medicine | Admitting: Emergency Medicine

## 2023-01-21 ENCOUNTER — Encounter (HOSPITAL_COMMUNITY): Payer: Self-pay

## 2023-01-21 ENCOUNTER — Emergency Department (HOSPITAL_COMMUNITY): Payer: Medicare Other

## 2023-01-21 ENCOUNTER — Other Ambulatory Visit: Payer: Self-pay

## 2023-01-21 DIAGNOSIS — W06XXXA Fall from bed, initial encounter: Secondary | ICD-10-CM | POA: Insufficient documentation

## 2023-01-21 DIAGNOSIS — R7989 Other specified abnormal findings of blood chemistry: Secondary | ICD-10-CM | POA: Insufficient documentation

## 2023-01-21 DIAGNOSIS — U071 COVID-19: Secondary | ICD-10-CM | POA: Insufficient documentation

## 2023-01-21 DIAGNOSIS — R531 Weakness: Secondary | ICD-10-CM | POA: Diagnosis present

## 2023-01-21 DIAGNOSIS — W19XXXA Unspecified fall, initial encounter: Secondary | ICD-10-CM

## 2023-01-21 LAB — CBC WITH DIFFERENTIAL/PLATELET
Abs Immature Granulocytes: 0.04 10*3/uL (ref 0.00–0.07)
Basophils Absolute: 0.1 10*3/uL (ref 0.0–0.1)
Basophils Relative: 1 %
Eosinophils Absolute: 0 10*3/uL (ref 0.0–0.5)
Eosinophils Relative: 0 %
HCT: 44.9 % (ref 36.0–46.0)
Hemoglobin: 14.4 g/dL (ref 12.0–15.0)
Immature Granulocytes: 1 %
Lymphocytes Relative: 13 %
Lymphs Abs: 1 10*3/uL (ref 0.7–4.0)
MCH: 30.6 pg (ref 26.0–34.0)
MCHC: 32.1 g/dL (ref 30.0–36.0)
MCV: 95.3 fL (ref 80.0–100.0)
Monocytes Absolute: 0.9 10*3/uL (ref 0.1–1.0)
Monocytes Relative: 11 %
Neutro Abs: 6 10*3/uL (ref 1.7–7.7)
Neutrophils Relative %: 74 %
Platelets: 193 10*3/uL (ref 150–400)
RBC: 4.71 MIL/uL (ref 3.87–5.11)
RDW: 12.4 % (ref 11.5–15.5)
WBC: 8 10*3/uL (ref 4.0–10.5)
nRBC: 0 % (ref 0.0–0.2)

## 2023-01-21 LAB — COMPREHENSIVE METABOLIC PANEL
ALT: 20 U/L (ref 0–44)
AST: 34 U/L (ref 15–41)
Albumin: 3.8 g/dL (ref 3.5–5.0)
Alkaline Phosphatase: 80 U/L (ref 38–126)
Anion gap: 12 (ref 5–15)
BUN: 14 mg/dL (ref 8–23)
CO2: 24 mmol/L (ref 22–32)
Calcium: 9.1 mg/dL (ref 8.9–10.3)
Chloride: 98 mmol/L (ref 98–111)
Creatinine, Ser: 1.36 mg/dL — ABNORMAL HIGH (ref 0.44–1.00)
GFR, Estimated: 40 mL/min — ABNORMAL LOW (ref >60)
Glucose, Bld: 98 mg/dL (ref 70–99)
Potassium: 4.2 mmol/L (ref 3.5–5.1)
Sodium: 134 mmol/L — ABNORMAL LOW (ref 135–145)
Total Bilirubin: 0.6 mg/dL (ref 0.3–1.2)
Total Protein: 7.2 g/dL (ref 6.5–8.1)

## 2023-01-21 LAB — TROPONIN I (HIGH SENSITIVITY)
Troponin I (High Sensitivity): 5 ng/L (ref <18)
Troponin I (High Sensitivity): 7 ng/L (ref <18)

## 2023-01-21 LAB — URINALYSIS, ROUTINE W REFLEX MICROSCOPIC
Bacteria, UA: NONE SEEN
Bilirubin Urine: NEGATIVE
Glucose, UA: NEGATIVE mg/dL
Ketones, ur: NEGATIVE mg/dL
Leukocytes,Ua: NEGATIVE
Nitrite: NEGATIVE
Protein, ur: NEGATIVE mg/dL
Specific Gravity, Urine: 1.005 (ref 1.005–1.030)
pH: 7 (ref 5.0–8.0)

## 2023-01-21 LAB — CBG MONITORING, ED: Glucose-Capillary: 106 mg/dL — ABNORMAL HIGH (ref 70–99)

## 2023-01-21 LAB — SARS CORONAVIRUS 2 BY RT PCR: SARS Coronavirus 2 by RT PCR: POSITIVE — AB

## 2023-01-21 MED ORDER — SODIUM CHLORIDE 0.9 % IV BOLUS
500.0000 mL | Freq: Once | INTRAVENOUS | Status: AC
  Administered 2023-01-21: 500 mL via INTRAVENOUS

## 2023-01-21 MED ORDER — LAGEVRIO 200 MG PO CAPS: 4.0000 | ORAL_CAPSULE | Freq: Two times a day (BID) | ORAL | 0 refills | Status: AC

## 2023-01-21 NOTE — ED Notes (Signed)
Patient transported to X-ray 

## 2023-01-21 NOTE — ED Provider Notes (Signed)
[facility] AT [facility] Provider Note   CSN: [id number] Arrival date & time: 01/21/23  9562     History  Chief Complaint  Patient presents with   Marletta Lor    Sandra [name] is a 82 y.o. female. With past medical history of arthritis, chronic back pain, DVT who presents to the emergency department with fall.  Husband at bedside is primary history. States that she got up around 0500 to use the restroom when she slid out of bed. Patient states she slid off the bed onto her knees. She denies hitting her head or loss of consciousness. Not anticoagulated. The husband and son at bedside state she was sweaty when they got her up. State yesterday she seems weak and slower than normal. COVID is going around the house. Patient does complain of scratchy throat without cough, shortness of breath or fever. She also denies any prodromal symptoms before or after fall like chest pain, shortness of breath, palpitations, dizziness. No fevers. No abdominal pain, nausea, vomiting, diarrhea. Has had decreased PO intake over the past 24 hours.    Fall       Home Medications Prior to Admission medications   Medication Sig Start Date End Date Taking? Authorizing Provider  molnupiravir EUA (LAGEVRIO) 200 MG CAPS capsule Take 4 capsules (800 mg total) by mouth 2 (two) times daily for 5 days. 01/21/23 01/26/23 Yes Cristopher Peru, PA-C  alendronate (FOSAMAX) 70 MG tablet Take 70 mg by mouth once a week. Take with a full glass of water on an empty stomach.    [provider]  gabapentin (NEURONTIN) 400 MG capsule Take 400-800 mg by mouth See admin instructions. Take 400 mg in the morning and 800 mg at night    [provider]  HYDROcodone-acetaminophen (NORCO/VICODIN) 5-325 MG tablet Take 1-2 tablets by mouth every 6 (six) hours as needed for moderate pain (if unrelieved by oxycodone). 08/18/17   Dorothea Ogle, MD  methocarbamol (ROBAXIN) 500 MG tablet Take 1 tablet  (500 mg total) by mouth every 6 (six) hours as needed for muscle spasms. 07/30/17   Perkins, Alexzandrew L, PA-C  Oxycodone HCl 10 MG TABS Take 1-2 tablets (10-20 mg total) by mouth every 4 (four) hours as needed. For pain 08/20/17   Dorothea Ogle, MD  pantoprazole (PROTONIX) 40 MG tablet Take 1 tablet (40 mg total) by mouth 2 (two) times daily. 08/18/17 08/18/18  Dorothea Ogle, MD  Polyvinyl Alcohol-Povidone (REFRESH OP) Apply 1 drop to eye 4 (four) times daily as needed (dry eyes).    [provider]  potassium gluconate (HM POTASSIUM) 595 MG TABS tablet Take 595 mg by mouth at bedtime.     [provider]  rivaroxaban (XARELTO) 10 MG TABS tablet Take 1 tablet (10 mg total) by mouth daily with breakfast. Take Xarelto for two and a half more weeks following discharge from the hospital, then discontinue Xarelto. Once the patient has completed the blood thinner regimen, then take a Baby 81 mg Aspirin daily for three more weeks. 07/31/17   Perkins, Alexzandrew L, PA-C      Allergies    Clarithromycin; Codeine; Resinol [dermatological products, misc.]; and Sulfa antibiotics    Review of Systems   Review of Systems  HENT:  Positive for sore throat.   Musculoskeletal:  Positive for arthralgias.  All other systems reviewed and are negative.   Physical Exam Updated Vital Signs BP 110/72   Pulse 78  Temp 99.1 F (37.3 C)   Resp 15   Ht 5\' 2"  (1.575 m)   Wt 90.7 kg   SpO2 100%   BMI 36.58 kg/m  Physical Exam Vitals and nursing note reviewed.  Constitutional:      General: She is not in acute distress.    Appearance: Normal appearance. She is obese. She is not ill-appearing or toxic-appearing.  HENT:     Head: Normocephalic and atraumatic.     Mouth/Throat:     Mouth: Mucous membranes are dry.     Pharynx: Oropharynx is clear.  Eyes:     General: No scleral icterus.    Extraocular Movements: Extraocular movements intact.  Cardiovascular:     Rate and Rhythm: Normal  rate and regular rhythm.     Pulses: Normal pulses.     Heart sounds: No murmur heard. Pulmonary:     Effort: Pulmonary effort is normal. No respiratory distress.     Breath sounds: Normal breath sounds.  Chest:     Chest wall: No tenderness.  Abdominal:     General: Abdomen is protuberant. Bowel sounds are normal. There is no distension.     Palpations: Abdomen is soft.     Tenderness: There is no abdominal tenderness.  Musculoskeletal:     Cervical back: Normal range of motion and neck supple. No tenderness.     Right hip: No deformity or tenderness.     Left hip: No deformity or tenderness.     Right knee: Erythema present. Normal range of motion. Tenderness present. Normal pulse.     Left knee: No swelling. No tenderness.  Skin:    General: Skin is warm and dry.     Capillary Refill: Capillary refill takes less than 2 seconds.     Findings: Erythema present.  Neurological:     General: No focal deficit present.     Mental Status: She is alert. Mental status is at baseline.     Cranial Nerves: No dysarthria or facial asymmetry.  Psychiatric:        Mood and Affect: Mood normal.        Behavior: Behavior normal.     ED Results / Procedures / Treatments   Labs (all labs ordered are listed, but only abnormal results are displayed) Labs Reviewed  SARS CORONAVIRUS 2 BY RT PCR - Abnormal; Notable for the following components:      Result Value   SARS Coronavirus 2 by RT PCR POSITIVE (*)    All other components within normal limits  COMPREHENSIVE METABOLIC PANEL - Abnormal; Notable for the following components:   Sodium 134 (*)    Creatinine, Ser 1.36 (*)    GFR, Estimated 40 (*)    All other components within normal limits  URINALYSIS, ROUTINE W REFLEX MICROSCOPIC - Abnormal; Notable for the following components:   APPearance HAZY (*)    Hgb urine dipstick MODERATE (*)    All other components within normal limits  CBG MONITORING, ED - Abnormal; Notable for the following  components:   Glucose-Capillary 106 (*)    All other components within normal limits  CBC WITH DIFFERENTIAL/PLATELET  TROPONIN I (HIGH SENSITIVITY)  TROPONIN I (HIGH SENSITIVITY)    EKG EKG Interpretation Date/Time:  Monday January 21 2023 06:36:12 EDT Ventricular Rate:  85 PR Interval:  150 QRS Duration:  73 QT Interval:  353 QTC Calculation: 420 R Axis:   48  Text Interpretation: Sinus rhythm No significant change was found Confirmed by  Glynn Octave ([id number]) on 01/21/2023 6:40:59 AM  Radiology DG Knee Complete 4 Views Right  Result Date: 01/21/2023 CLINICAL DATA:  Fall. EXAM: RIGHT KNEE - COMPLETE 4+ VIEW COMPARISON:  None Available. FINDINGS: Right total knee arthroplasty is located without periprosthetic fracture. Alignment is within normal limits. No focal soft tissue abnormality. No large joint effusion identified. IMPRESSION: 1. No acute bone abnormality to the right knee. 2. 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 Electronically Signed   By: Richarda Overlie M.D.   On: 01/21/2023 07:43    Procedures Procedures   Medications Ordered in ED Medications  sodium chloride 0.9 % bolus 500 mL (0 mLs Intravenous Stopped 01/21/23 0943)  sodium chloride 0.9 % bolus 500 mL (0 mLs Intravenous Stopped 01/21/23 1242)    ED Course/ Medical Decision Making/ A&P   {    Medical Decision Making Amount and/or Complexity of Data Reviewed Labs: ordered. Radiology: ordered.  Risk Prescription drug management.  Initial Impression and Ddx 82 year old female who presents to the emergency department with fall  Patient PMH that increases complexity of ED encounter:  arthritis, DVT  Interpretation of Diagnostics I independent reviewed and interpreted the labs as followed: COVID-positive, UA negative, CBC without a leukocytosis, CMP with mildly elevated creatinine, troponin x 2 is negative  - I independently visualized the following imaging with scope of interpretation limited to  determining acute life threatening conditions related to emergency care: Plain film of the knee, which revealed no acute findings  Patient Reassessment and Ultimate Disposition/Management 82 year old female who presents to the emergency department with a fall. She is overall well appearing. Non toxic in appearance. Hemodynamically stable without fever.  Fall onto her knees. Did not strike her head or lose consciousness. She is at baseline mental status per family at bedside. Not anticoagulated.  COVID + family in household with history of about 24 hours of decreased appetite, malaise. Will get labs, COVID, EKG, troponin, urine. Xray of her right knee. She has no c-spine TTP. No trauma to her head. Pelvis is stable. Will give fluids. Discussed with attending Dr. Manus Gunning that do not feel pt needs CT head or C-spine give mechanism of injury. No trauma to head. Not anticoagulated. Baseline mental status. He agrees.   Reassessment is stable.  CMP with no significant electrolyte derangements.  She has mild increase in her creatinine to 1.36, she has been given 1 L of IV fluids.  Likely from decreased p.o. intake over the past 24 hours.  CBC without a leukocytosis or anemia.  Glucose is normal.  Troponin x 2 is negative.  UA is negative. COVID-positive X-ray of the right knee is stable  Do not feel that her weakness is related to ACS.  Given her negative troponins, EKG being stable doubt this.  No significant anemia to explain her weakness.  Think her symptoms are likely related to her new COVID diagnosis, general malaise and decreased p.o. intake over the past 24 hours.  Do not feel that she needs chest x-ray at this time.  She oxygenates 100% on room air, no leukocytosis, cough or shortness of breath.  Her lungs are clear bilaterally on auscultation.  No fever.  She was able to ambulate here in the emergency department without assistance or symptoms.  Again, mechanical fall out of bed onto her knees.   There was no head injury.  Not anticoagulated.  Neurovascularly intact and at mental baseline.  She received labs including COVID, UA.  She does have COVID.  Given a  liter of IV fluids.  Given return precautions for any worsening symptoms.  Otherwise feel that she is appropriate for recheck of symptoms in about 1 week with PCP.  The patient has been appropriately medically screened and/or stabilized in the ED. I have low suspicion for any other emergent medical condition which would require further screening, evaluation or treatment in the ED or require inpatient management. At time of discharge the patient is hemodynamically stable and in no acute distress. I have discussed work-up results and diagnosis with patient and answered all questions. Patient is agreeable with discharge plan. We discussed strict return precautions for returning to the emergency department and they verbalized understanding.     Patient management required discussion with the following services or consulting groups:  None  Complexity of Problems Addressed Acute complicated illness or Injury  Additional Data Reviewed and Analyzed Further history obtained from: Further history from spouse/family member, Past medical history and medications listed in the EMR, Prior ED visit notes, and Care Everywhere  Patient Encounter Risk Assessment Prescriptions and Consideration of hospitalization  Final Clinical Impression(s) / ED Diagnoses Final diagnoses:  Fall, initial encounter  COVID-19    Rx / DC Orders ED Discharge Orders          Ordered    molnupiravir EUA (LAGEVRIO) 200 MG CAPS capsule  2 times daily        01/21/23 1240              Cristopher Peru, PA-C 01/21/23 1246    Arby Barrette, MD 01/21/23 1715

## 2023-01-21 NOTE — ED Notes (Signed)
ED Provider at bedside. 

## 2023-01-21 NOTE — ED Notes (Signed)
Pt ambulated in hallway with walker well.

## 2023-01-21 NOTE — Discharge Instructions (Addendum)
You were seen in the emergency department today for a fall and weakness.  You do have COVID.  For this I am prescribing you molnupiravir that you will take over the next 5 days.  Additionally please continue drinking plenty of fluids.  Please return to the emergency department if you develop any shortness of breath or difficulty breathing.  Please follow-up with your primary care provider in the next week to have a symptom recheck.

## 2023-01-21 NOTE — ED Notes (Signed)
Main lab contacted about nasal swab.  Lab currently running test.

## 2023-01-21 NOTE — ED Triage Notes (Signed)
Pt from home after falling out of bed, c/o rt knee abrasion. Per family pt also very sweaty

## 2023-12-08 ENCOUNTER — Emergency Department (HOSPITAL_COMMUNITY)

## 2023-12-08 ENCOUNTER — Other Ambulatory Visit: Payer: Self-pay

## 2023-12-08 ENCOUNTER — Inpatient Hospital Stay (HOSPITAL_COMMUNITY)
Admission: EM | Admit: 2023-12-08 | Discharge: 2023-12-13 | DRG: 871 | Disposition: A | Discharge status: Hospice | Attending: Family Medicine | Admitting: Family Medicine

## 2023-12-08 ENCOUNTER — Encounter (HOSPITAL_COMMUNITY): Payer: Self-pay

## 2023-12-08 DIAGNOSIS — E860 Dehydration: Secondary | ICD-10-CM | POA: Diagnosis present

## 2023-12-08 DIAGNOSIS — R6510 Systemic inflammatory response syndrome (SIRS) of non-infectious origin without acute organ dysfunction: Secondary | ICD-10-CM

## 2023-12-08 DIAGNOSIS — A419 Sepsis, unspecified organism: Secondary | ICD-10-CM | POA: Diagnosis not present

## 2023-12-08 DIAGNOSIS — Z7983 Long term (current) use of bisphosphonates: Secondary | ICD-10-CM

## 2023-12-08 DIAGNOSIS — Z888 Allergy status to other drugs, medicaments and biological substances status: Secondary | ICD-10-CM

## 2023-12-08 DIAGNOSIS — Z882 Allergy status to sulfonamides status: Secondary | ICD-10-CM

## 2023-12-08 DIAGNOSIS — G8929 Other chronic pain: Secondary | ICD-10-CM | POA: Diagnosis present

## 2023-12-08 DIAGNOSIS — E876 Hypokalemia: Secondary | ICD-10-CM | POA: Diagnosis present

## 2023-12-08 DIAGNOSIS — R627 Adult failure to thrive: Secondary | ICD-10-CM | POA: Diagnosis present

## 2023-12-08 DIAGNOSIS — E669 Obesity, unspecified: Secondary | ICD-10-CM | POA: Diagnosis present

## 2023-12-08 DIAGNOSIS — R6520 Severe sepsis without septic shock: Secondary | ICD-10-CM | POA: Diagnosis present

## 2023-12-08 DIAGNOSIS — K5900 Constipation, unspecified: Secondary | ICD-10-CM | POA: Diagnosis present

## 2023-12-08 DIAGNOSIS — D649 Anemia, unspecified: Secondary | ICD-10-CM | POA: Diagnosis present

## 2023-12-08 DIAGNOSIS — J9601 Acute respiratory failure with hypoxia: Secondary | ICD-10-CM | POA: Diagnosis present

## 2023-12-08 DIAGNOSIS — Z66 Do not resuscitate: Secondary | ICD-10-CM | POA: Diagnosis present

## 2023-12-08 DIAGNOSIS — Z90710 Acquired absence of both cervix and uterus: Secondary | ICD-10-CM

## 2023-12-08 DIAGNOSIS — K760 Fatty (change of) liver, not elsewhere classified: Secondary | ICD-10-CM | POA: Diagnosis present

## 2023-12-08 DIAGNOSIS — Z87891 Personal history of nicotine dependence: Secondary | ICD-10-CM

## 2023-12-08 DIAGNOSIS — W19XXXA Unspecified fall, initial encounter: Secondary | ICD-10-CM | POA: Diagnosis present

## 2023-12-08 DIAGNOSIS — D751 Secondary polycythemia: Secondary | ICD-10-CM | POA: Diagnosis present

## 2023-12-08 DIAGNOSIS — R638 Other symptoms and signs concerning food and fluid intake: Secondary | ICD-10-CM | POA: Diagnosis not present

## 2023-12-08 DIAGNOSIS — E872 Acidosis, unspecified: Secondary | ICD-10-CM | POA: Diagnosis present

## 2023-12-08 DIAGNOSIS — Z86718 Personal history of other venous thrombosis and embolism: Secondary | ICD-10-CM

## 2023-12-08 DIAGNOSIS — Z7901 Long term (current) use of anticoagulants: Secondary | ICD-10-CM | POA: Diagnosis not present

## 2023-12-08 DIAGNOSIS — R296 Repeated falls: Secondary | ICD-10-CM | POA: Diagnosis present

## 2023-12-08 DIAGNOSIS — Z515 Encounter for palliative care: Secondary | ICD-10-CM | POA: Diagnosis not present

## 2023-12-08 DIAGNOSIS — R300 Dysuria: Secondary | ICD-10-CM | POA: Diagnosis present

## 2023-12-08 DIAGNOSIS — Z7189 Other specified counseling: Secondary | ICD-10-CM | POA: Diagnosis not present

## 2023-12-08 DIAGNOSIS — Z885 Allergy status to narcotic agent status: Secondary | ICD-10-CM

## 2023-12-08 DIAGNOSIS — Y92009 Unspecified place in unspecified non-institutional (private) residence as the place of occurrence of the external cause: Secondary | ICD-10-CM

## 2023-12-08 DIAGNOSIS — Z6836 Body mass index (BMI) 36.0-36.9, adult: Secondary | ICD-10-CM

## 2023-12-08 DIAGNOSIS — R680 Hypothermia, not associated with low environmental temperature: Secondary | ICD-10-CM | POA: Diagnosis present

## 2023-12-08 DIAGNOSIS — N179 Acute kidney failure, unspecified: Secondary | ICD-10-CM | POA: Diagnosis present

## 2023-12-08 DIAGNOSIS — Z789 Other specified health status: Secondary | ICD-10-CM

## 2023-12-08 DIAGNOSIS — F03C2 Unspecified dementia, severe, with psychotic disturbance: Secondary | ICD-10-CM | POA: Diagnosis present

## 2023-12-08 DIAGNOSIS — Z7982 Long term (current) use of aspirin: Secondary | ICD-10-CM

## 2023-12-08 DIAGNOSIS — Z981 Arthrodesis status: Secondary | ICD-10-CM

## 2023-12-08 DIAGNOSIS — M545 Low back pain, unspecified: Secondary | ICD-10-CM | POA: Diagnosis present

## 2023-12-08 DIAGNOSIS — K5730 Diverticulosis of large intestine without perforation or abscess without bleeding: Secondary | ICD-10-CM | POA: Diagnosis present

## 2023-12-08 DIAGNOSIS — R52 Pain, unspecified: Secondary | ICD-10-CM | POA: Diagnosis not present

## 2023-12-08 DIAGNOSIS — Z96641 Presence of right artificial hip joint: Secondary | ICD-10-CM | POA: Diagnosis present

## 2023-12-08 DIAGNOSIS — R109 Unspecified abdominal pain: Secondary | ICD-10-CM | POA: Diagnosis present

## 2023-12-08 DIAGNOSIS — M8580 Other specified disorders of bone density and structure, unspecified site: Secondary | ICD-10-CM | POA: Diagnosis present

## 2023-12-08 LAB — COMPREHENSIVE METABOLIC PANEL WITH GFR
ALT: 29 U/L (ref 0–44)
AST: 39 U/L (ref 15–41)
Albumin: 3.7 g/dL (ref 3.5–5.0)
Alkaline Phosphatase: 48 U/L (ref 38–126)
Anion gap: 17 — ABNORMAL HIGH (ref 5–15)
BUN: 15 mg/dL (ref 8–23)
CO2: 26 mmol/L (ref 22–32)
Calcium: 9.5 mg/dL (ref 8.9–10.3)
Chloride: 99 mmol/L (ref 98–111)
Creatinine, Ser: 1.67 mg/dL — ABNORMAL HIGH (ref 0.44–1.00)
GFR, Estimated: 31 mL/min — ABNORMAL LOW (ref >60)
Glucose, Bld: 124 mg/dL — ABNORMAL HIGH (ref 70–99)
Potassium: 3.1 mmol/L — ABNORMAL LOW (ref 3.5–5.1)
Sodium: 142 mmol/L (ref 135–145)
Total Bilirubin: 1.3 mg/dL — ABNORMAL HIGH (ref 0.0–1.2)
Total Protein: 7.4 g/dL (ref 6.5–8.1)

## 2023-12-08 LAB — I-STAT CG4 LACTIC ACID, ED
Lactic Acid, Venous: 3 mmol/L (ref 0.5–1.9)
Lactic Acid, Venous: 2.2 mmol/L (ref 0.5–1.9)

## 2023-12-08 LAB — URINALYSIS, W/ REFLEX TO CULTURE (INFECTION SUSPECTED)
Bilirubin Urine: NEGATIVE
Glucose, UA: NEGATIVE mg/dL
Ketones, ur: 5 mg/dL — AB
Leukocytes,Ua: NEGATIVE
Nitrite: NEGATIVE
Protein, ur: 30 mg/dL — AB
Specific Gravity, Urine: 1.04 — ABNORMAL HIGH (ref 1.005–1.030)
pH: 5 (ref 5.0–8.0)

## 2023-12-08 LAB — CBC WITH DIFFERENTIAL/PLATELET
Abs Immature Granulocytes: 0.06 10*3/uL (ref 0.00–0.07)
Basophils Absolute: 0.1 10*3/uL (ref 0.0–0.1)
Basophils Relative: 1 %
Eosinophils Absolute: 0.1 10*3/uL (ref 0.0–0.5)
Eosinophils Relative: 1 %
HCT: 52.5 % — ABNORMAL HIGH (ref 36.0–46.0)
Hemoglobin: 17.3 g/dL — ABNORMAL HIGH (ref 12.0–15.0)
Immature Granulocytes: 1 %
Lymphocytes Relative: 18 %
Lymphs Abs: 1.7 10*3/uL (ref 0.7–4.0)
MCH: 30.4 pg (ref 26.0–34.0)
MCHC: 33 g/dL (ref 30.0–36.0)
MCV: 92.3 fL (ref 80.0–100.0)
Monocytes Absolute: 0.8 10*3/uL (ref 0.1–1.0)
Monocytes Relative: 9 %
Neutro Abs: 6.8 10*3/uL (ref 1.7–7.7)
Neutrophils Relative %: 70 %
Platelets: 230 10*3/uL (ref 150–400)
RBC: 5.69 MIL/uL — ABNORMAL HIGH (ref 3.87–5.11)
RDW: 12.8 % (ref 11.5–15.5)
WBC: 9.6 10*3/uL (ref 4.0–10.5)
nRBC: 0 % (ref 0.0–0.2)

## 2023-12-08 LAB — PROTIME-INR
INR: 1 (ref 0.8–1.2)
Prothrombin Time: 13.8 s (ref 11.4–15.2)

## 2023-12-08 LAB — CK: Total CK: 121 U/L (ref 38–234)

## 2023-12-08 LAB — LIPASE, BLOOD: Lipase: 95 U/L — ABNORMAL HIGH (ref 11–51)

## 2023-12-08 LAB — CBG MONITORING, ED: Glucose-Capillary: 108 mg/dL — ABNORMAL HIGH (ref 70–99)

## 2023-12-08 MED ORDER — ENOXAPARIN SODIUM 30 MG/0.3ML IJ SOSY
30.0000 mg | PREFILLED_SYRINGE | INTRAMUSCULAR | Status: DC
Start: 2023-12-08 — End: 2023-12-09
  Administered 2023-12-08: 30 mg via SUBCUTANEOUS
  Filled 2023-12-08: qty 0.3

## 2023-12-08 MED ORDER — POTASSIUM CHLORIDE CRYS ER 20 MEQ PO TBCR
40.0000 meq | EXTENDED_RELEASE_TABLET | Freq: Once | ORAL | Status: AC
  Administered 2023-12-08: 40 meq via ORAL
  Filled 2023-12-08: qty 2

## 2023-12-08 MED ORDER — VANCOMYCIN HCL IN DEXTROSE 1-5 GM/200ML-% IV SOLN: 1000.0000 mg | Freq: Once | INTRAVENOUS | Status: DC

## 2023-12-08 MED ORDER — DONEPEZIL HCL 10 MG PO TABS
10.0000 mg | ORAL_TABLET | Freq: Every day | ORAL | Status: DC
  Administered 2023-12-08 – 2023-12-09 (×2): 10 mg via ORAL
  Filled 2023-12-08 (×2): qty 1

## 2023-12-08 MED ORDER — SODIUM CHLORIDE 0.9 % IV SOLN
2.0000 g | Freq: Once | INTRAVENOUS | Status: AC
  Administered 2023-12-08: 2 g via INTRAVENOUS
  Filled 2023-12-08: qty 12.5

## 2023-12-08 MED ORDER — LACTATED RINGERS IV SOLN: INTRAVENOUS | Status: AC

## 2023-12-08 MED ORDER — LACTATED RINGERS IV BOLUS
1000.0000 mL | Freq: Once | INTRAVENOUS | Status: AC
  Administered 2023-12-08: 1000 mL via INTRAVENOUS

## 2023-12-08 MED ORDER — OXYCODONE HCL 5 MG PO TABS
5.0000 mg | ORAL_TABLET | ORAL | Status: DC | PRN
  Administered 2023-12-08 – 2023-12-10 (×5): 5 mg via ORAL
  Filled 2023-12-08 (×6): qty 1

## 2023-12-08 MED ORDER — VANCOMYCIN HCL 2000 MG/400ML IV SOLN
2000.0000 mg | Freq: Once | INTRAVENOUS | Status: AC
  Administered 2023-12-08: 2000 mg via INTRAVENOUS
  Filled 2023-12-08: qty 400

## 2023-12-08 MED ORDER — METRONIDAZOLE 500 MG/100ML IV SOLN
500.0000 mg | Freq: Once | INTRAVENOUS | Status: AC
  Administered 2023-12-08: 500 mg via INTRAVENOUS
  Filled 2023-12-08: qty 100

## 2023-12-08 MED ORDER — ACETAMINOPHEN 325 MG PO TABS
650.0000 mg | ORAL_TABLET | Freq: Four times a day (QID) | ORAL | Status: DC | PRN
  Filled 2023-12-08: qty 2

## 2023-12-08 MED ORDER — POLYETHYLENE GLYCOL 3350 17 G PO PACK
17.0000 g | PACK | Freq: Every day | ORAL | Status: DC
  Administered 2023-12-08 – 2023-12-11 (×2): 17 g via ORAL
  Filled 2023-12-08 (×2): qty 1

## 2023-12-08 MED ORDER — ACETAMINOPHEN 650 MG RE SUPP: 650.0000 mg | Freq: Four times a day (QID) | RECTAL | Status: DC | PRN

## 2023-12-08 MED ORDER — IOHEXOL 350 MG/ML SOLN
65.0000 mL | Freq: Once | INTRAVENOUS | Status: AC | PRN
  Administered 2023-12-08: 65 mL via INTRAVENOUS

## 2023-12-08 MED ORDER — QUETIAPINE FUMARATE 25 MG PO TABS
25.0000 mg | ORAL_TABLET | Freq: Every day | ORAL | Status: DC
  Administered 2023-12-08: 25 mg via ORAL
  Filled 2023-12-08: qty 1

## 2023-12-08 MED ORDER — SODIUM CHLORIDE 0.9 % IV SOLN
2.0000 g | INTRAVENOUS | Status: DC
  Administered 2023-12-08 – 2023-12-09 (×2): 2 g via INTRAVENOUS
  Filled 2023-12-08 (×2): qty 20

## 2023-12-08 MED ORDER — GABAPENTIN 100 MG PO CAPS
100.0000 mg | ORAL_CAPSULE | Freq: Every day | ORAL | Status: DC
  Administered 2023-12-08: 100 mg via ORAL
  Filled 2023-12-08: qty 1

## 2023-12-08 MED ORDER — DIVALPROEX SODIUM 125 MG PO CSDR
125.0000 mg | DELAYED_RELEASE_CAPSULE | Freq: Two times a day (BID) | ORAL | Status: DC
  Administered 2023-12-08 – 2023-12-09 (×4): 125 mg via ORAL
  Filled 2023-12-08 (×6): qty 1

## 2023-12-08 NOTE — ED Notes (Signed)
 Receiving Floor given report and accepted PT

## 2023-12-08 NOTE — Assessment & Plan Note (Signed)
 Cr elevated to 1.67. S/p LR bolus.  - Maintenance IVF: LR 130 mL/hr - AM BMP

## 2023-12-08 NOTE — ED Provider Notes (Signed)
 [facility] AT [facility] Provider Note   CSN: [id number] Arrival date & time: 12/08/23  4098     History  Chief Complaint  Patient presents with   Dementia   Fall    Sandra [name] is a 83 y.o. female.  This is an 83 year old female presenting emergency department after a fall at home.  Husband was trying to help her get into bed when she fell backwards onto her butt.  Did not hit her head no LOC.  He also notes that she has had several falls the past several weeks.  Does have dementia, notes that her confusion has worsened over the past week and she is seemingly hallucinating and talking to family members at home that are not in the room.  Denies any fevers chills no chest pain, no shortness of breath.  Husband notes that she has been complaining of abdominal pain for the past 3 days or so and has had little to no bowel movements.  No reported nausea vomiting, but has had decreased p.o. intake.   Fall       Home Medications Prior to Admission medications   Medication Sig Start Date End Date Taking? Authorizing Provider  alendronate (FOSAMAX) 70 MG tablet Take 70 mg by mouth once a week. Take with a full glass of water  on an empty stomach.    [provider]  gabapentin  (NEURONTIN ) 400 MG capsule Take 400-800 mg by mouth See admin instructions. Take 400 mg in the morning and 800 mg at night    [provider]  HYDROcodone -acetaminophen  (NORCO/VICODIN) 5-325 MG tablet Take 1-2 tablets by mouth every 6 (six) hours as needed for moderate pain (if unrelieved by oxycodone ). 08/18/17   Sander Crooked, MD  methocarbamol  (ROBAXIN ) 500 MG tablet Take 1 tablet (500 mg total) by mouth every 6 (six) hours as needed for muscle spasms. 07/30/17   Perkins, Alexzandrew L, PA-C  Oxycodone  HCl 10 MG TABS Take 1-2 tablets (10-20 mg total) by mouth every 4 (four) hours as needed. For pain 08/20/17   Sander Crooked, MD  pantoprazole  (PROTONIX ) 40 MG tablet  Take 1 tablet (40 mg total) by mouth 2 (two) times daily. 08/18/17 08/18/18  Sander Crooked, MD  Polyvinyl Alcohol -Povidone (REFRESH OP) Apply 1 drop to eye 4 (four) times daily as needed (dry eyes).    [provider]  potassium gluconate (HM POTASSIUM) 595 MG TABS tablet Take 595 mg by mouth at bedtime.     [provider]  rivaroxaban  (XARELTO ) 10 MG TABS tablet Take 1 tablet (10 mg total) by mouth daily with breakfast. Take Xarelto  for two and a half more weeks following discharge from the hospital, then discontinue Xarelto . Once the patient has completed the blood thinner regimen, then take a Baby 81 mg Aspirin  daily for three more weeks. 07/31/17   Perkins, Alexzandrew L, PA-C      Allergies    Clarithromycin; Codeine; Resinol [dermatological products, misc.]; and Sulfa antibiotics    Review of Systems   Review of Systems  Physical Exam Updated Vital Signs BP 131/76 (BP Location: Right Arm)   Pulse 73   Temp (!) 96.4 F (35.8 C) (Axillary)   Resp 17   Ht 5\' 2"  (1.575 m)   Wt 90 kg   SpO2 99%   BMI 36.29 kg/m  Physical Exam Vitals and nursing note reviewed.  Constitutional:      General: She is not in acute distress.  Appearance: She is obese. She is not ill-appearing.  HENT:     Head: Normocephalic.     Nose: Nose normal.     Mouth/Throat:     Mouth: Mucous membranes are dry.  Eyes:     Conjunctiva/sclera: Conjunctivae normal.  Cardiovascular:     Rate and Rhythm: Normal rate and regular rhythm.  Pulmonary:     Effort: Pulmonary effort is normal.     Breath sounds: Normal breath sounds. No wheezing, rhonchi or rales.  Abdominal:     General: Abdomen is flat.     Tenderness: There is abdominal tenderness (mild). There is no guarding or rebound.  Musculoskeletal:        General: No deformity.     Cervical back: Normal range of motion.     Right lower leg: No edema.     Left lower leg: No edema.  Skin:    General: Skin is warm and dry.      Capillary Refill: Capillary refill takes less than 2 seconds.  Neurological:     Mental Status: She is alert. Mental status is at baseline.     Motor: No weakness.  Psychiatric:     Comments: Agitated     ED Results / Procedures / Treatments   Labs (all labs ordered are listed, but only abnormal results are displayed) Labs Reviewed  COMPREHENSIVE METABOLIC PANEL WITH GFR - Abnormal; Notable for the following components:      Result Value   Potassium 3.1 (*)    Glucose, Bld 124 (*)    Creatinine, Ser 1.67 (*)    Total Bilirubin 1.3 (*)    GFR, Estimated 31 (*)    Anion gap 17 (*)    All other components within normal limits  CBC WITH DIFFERENTIAL/PLATELET - Abnormal; Notable for the following components:   RBC 5.69 (*)    Hemoglobin 17.3 (*)    HCT 52.5 (*)    All other components within normal limits  LIPASE, BLOOD - Abnormal; Notable for the following components:   Lipase 95 (*)    All other components within normal limits  I-STAT CG4 LACTIC ACID, ED - Abnormal; Notable for the following components:   Lactic Acid, Venous 3.0 (*)    All other components within normal limits  I-STAT CG4 LACTIC ACID, ED - Abnormal; Notable for the following components:   Lactic Acid, Venous 2.2 (*)    All other components within normal limits  CULTURE, BLOOD (ROUTINE X 2)  CULTURE, BLOOD (ROUTINE X 2)  PROTIME-INR  URINALYSIS, W/ REFLEX TO CULTURE (INFECTION SUSPECTED)  CBG MONITORING, ED    EKG EKG Interpretation Date/Time:  Sunday December 08 2023 07:45:48 EDT Ventricular Rate:  82 PR Interval:  147 QRS Duration:  79 QT Interval:  378 QTC Calculation: 442 R Axis:   -20  Text Interpretation: Sinus rhythm Probable left atrial enlargement Borderline left axis deviation Borderline T abnormalities, anterior leads Confirmed by Elise Guile [phone]) on 12/08/2023 9:21:02 AM  Radiology CT ABDOMEN PELVIS W CONTRAST Result Date: 12/08/2023 CLINICAL DATA:  Concern for pulmonary embolism.  Sepsis. Abdominal pain. Bowel obstruction. EXAM: CT ANGIOGRAPHY CHEST CT ABDOMEN AND PELVIS WITH CONTRAST TECHNIQUE: Multidetector CT imaging of the chest was performed using the standard protocol during bolus administration of intravenous contrast. Multiplanar CT image reconstructions and MIPs were obtained to evaluate the vascular anatomy. Multidetector CT imaging of the abdomen and pelvis was performed using the standard protocol during bolus administration of intravenous contrast. RADIATION DOSE REDUCTION:  This exam was performed according to the departmental dose-optimization program which includes automated exposure control, adjustment of the mA and/or kV according to patient size and/or use of iterative reconstruction technique. CONTRAST:  65mL OMNIPAQUE  IOHEXOL  350 MG/ML SOLN COMPARISON:  Chest radiograph dated 12/08/2023. FINDINGS: CTA CHEST FINDINGS Cardiovascular: There is no cardiomegaly or pericardial effusion. There is coronary vascular calcification. Mild atherosclerotic calcification of the thoracic aorta. No aneurysmal dilatation or dissection. The origins of the great vessels of the aortic arch appear patent as visualized. No pulmonary artery embolus identified. Mediastinum/Nodes: No hilar or mediastinal adenopathy. The esophagus is grossly unremarkable. No mediastinal fluid collection. Lungs/Pleura: No focal consolidation, pleural effusion, pneumothorax. The central airways are patent. Musculoskeletal: There is osteopenia with degenerative changes of the spine. Thoracolumbar posterior fusion hardware. No acute osseous pathology. Review of the MIP images confirms the above findings. CT ABDOMEN and PELVIS FINDINGS No intra-abdominal free air or free fluid. Hepatobiliary: Fatty liver. No biliary dilatation. The gallbladder is unremarkable. Pancreas: Unremarkable. No pancreatic ductal dilatation or surrounding inflammatory changes. Spleen: Normal in size without focal abnormality. Adrenals/Urinary  Tract: The adrenal glands unremarkable there is no hydronephrosis on either side. There is symmetric enhancement and excretion of contrast by both kidneys. The visualized ureters and urinary bladder appear unremarkable. Stomach/Bowel: There is sigmoid diverticulosis. There is no bowel obstruction or active inflammation. The appendix is not visualized with certainty. No inflammatory changes identified in the right lower quadrant. Vascular/Lymphatic: Moderate aortoiliac atherosclerotic disease. The IVC is unremarkable. No portal venous gas. There is no adenopathy. Reproductive: Hysterectomy.  No suspicious adnexal masses. Other: None Musculoskeletal: Osteopenia with degenerative changes of the spine. Thoracolumbar posterior fusion hardware. Total right hip arthroplasty. No acute osseous pathology. Review of the MIP images confirms the above findings. IMPRESSION: 1. No acute intrathoracic, abdominal, or pelvic pathology. No CT evidence of pulmonary artery embolus. 2. Sigmoid diverticulosis. No bowel obstruction. 3. Fatty liver. 4.  Aortic Atherosclerosis (ICD10-I70.0). Electronically Signed   By: Angus Bark M.D.   On: 12/08/2023 10:28   CT Angio Chest PE W and/or Wo Contrast Result Date: 12/08/2023 CLINICAL DATA:  Concern for pulmonary embolism. Sepsis. Abdominal pain. Bowel obstruction. EXAM: CT ANGIOGRAPHY CHEST CT ABDOMEN AND PELVIS WITH CONTRAST TECHNIQUE: Multidetector CT imaging of the chest was performed using the standard protocol during bolus administration of intravenous contrast. Multiplanar CT image reconstructions and MIPs were obtained to evaluate the vascular anatomy. Multidetector CT imaging of the abdomen and pelvis was performed using the standard protocol during bolus administration of intravenous contrast. RADIATION DOSE REDUCTION: This exam was performed according to the departmental dose-optimization program which includes automated exposure control, adjustment of the mA and/or kV  according to patient size and/or use of iterative reconstruction technique. CONTRAST:  65mL OMNIPAQUE  IOHEXOL  350 MG/ML SOLN COMPARISON:  Chest radiograph dated 12/08/2023. FINDINGS: CTA CHEST FINDINGS Cardiovascular: There is no cardiomegaly or pericardial effusion. There is coronary vascular calcification. Mild atherosclerotic calcification of the thoracic aorta. No aneurysmal dilatation or dissection. The origins of the great vessels of the aortic arch appear patent as visualized. No pulmonary artery embolus identified. Mediastinum/Nodes: No hilar or mediastinal adenopathy. The esophagus is grossly unremarkable. No mediastinal fluid collection. Lungs/Pleura: No focal consolidation, pleural effusion, pneumothorax. The central airways are patent. Musculoskeletal: There is osteopenia with degenerative changes of the spine. Thoracolumbar posterior fusion hardware. No acute osseous pathology. Review of the MIP images confirms the above findings. CT ABDOMEN and PELVIS FINDINGS No intra-abdominal free air or free fluid. Hepatobiliary: Fatty liver.  No biliary dilatation. The gallbladder is unremarkable. Pancreas: Unremarkable. No pancreatic ductal dilatation or surrounding inflammatory changes. Spleen: Normal in size without focal abnormality. Adrenals/Urinary Tract: The adrenal glands unremarkable there is no hydronephrosis on either side. There is symmetric enhancement and excretion of contrast by both kidneys. The visualized ureters and urinary bladder appear unremarkable. Stomach/Bowel: There is sigmoid diverticulosis. There is no bowel obstruction or active inflammation. The appendix is not visualized with certainty. No inflammatory changes identified in the right lower quadrant. Vascular/Lymphatic: Moderate aortoiliac atherosclerotic disease. The IVC is unremarkable. No portal venous gas. There is no adenopathy. Reproductive: Hysterectomy.  No suspicious adnexal masses. Other: None Musculoskeletal: Osteopenia with  degenerative changes of the spine. Thoracolumbar posterior fusion hardware. Total right hip arthroplasty. No acute osseous pathology. Review of the MIP images confirms the above findings. IMPRESSION: 1. No acute intrathoracic, abdominal, or pelvic pathology. No CT evidence of pulmonary artery embolus. 2. Sigmoid diverticulosis. No bowel obstruction. 3. Fatty liver. 4.  Aortic Atherosclerosis (ICD10-I70.0). Electronically Signed   By: Angus Bark M.D.   On: 12/08/2023 10:28   CT Head Wo Contrast Result Date: 12/08/2023 CLINICAL DATA:  Mental status change, unknown cause EXAM: CT HEAD WITHOUT CONTRAST TECHNIQUE: Contiguous axial images were obtained from the base of the skull through the vertex without intravenous contrast. RADIATION DOSE REDUCTION: This exam was performed according to the departmental dose-optimization program which includes automated exposure control, adjustment of the mA and/or kV according to patient size and/or use of iterative reconstruction technique. COMPARISON:  None Available. FINDINGS: Brain: There is periventricular white matter decreased attenuation consistent with small vessel ischemic changes. Ventricles, sulci and cisterns are prominent consistent with age related involutional changes. No acute intracranial hemorrhage, mass effect or shift. No hydrocephalus. Vascular: No hyperdense vessel or unexpected calcification. Skull: Normal. Negative for fracture or focal lesion. Sinuses/Orbits: No acute finding. IMPRESSION: Atrophy and chronic small vessel ischemic changes. No acute intracranial process identified. Electronically Signed   By: Sydell Eva M.D.   On: 12/08/2023 10:09   DG Chest 1 View Result Date: 12/08/2023 CLINICAL DATA:  83 year old female with shortness of breath. Frequent falls. Increased confusion. EXAM: CHEST  1 VIEW COMPARISON:  Portable chest 08/14/2017 and earlier. FINDINGS: Portable AP semi upright view at 0716 hours. Lordotic positioning similar to the  2019 exam. Chronic lower thoracic and upper lumbar posterior spinal fusion hardware, grossly stable. Lung volumes and mediastinal contours remain within normal limits. Allowing for portable technique the lungs are clear. No pneumothorax or pleural effusion. Paucity of bowel gas. Stable visualized osseous structures. IMPRESSION: No acute cardiopulmonary abnormality. Electronically Signed   By: Marlise Simpers M.D.   On: 12/08/2023 07:59    Procedures Procedures    Medications Ordered in ED Medications  metroNIDAZOLE (FLAGYL) IVPB 500 mg (500 mg Intravenous New Bag/Given 12/08/23 1013)  vancomycin  (VANCOREADY) IVPB 2000 mg/400 mL (has no administration in time range)  lactated ringers  bolus 1,000 mL (has no administration in time range)  ceFEPIme (MAXIPIME) 2 g in sodium chloride  0.9 % 100 mL IVPB (0 g Intravenous Stopped 12/08/23 0905)  lactated ringers  bolus 1,000 mL (1,000 mLs Intravenous New Bag/Given 12/08/23 0825)  potassium chloride  SA (KLOR-CON  M) CR tablet 40 mEq (40 mEq Oral Given 12/08/23 0907)  iohexol  (OMNIPAQUE ) 350 MG/ML injection 65 mL (65 mLs Intravenous Contrast Given 12/08/23 1000)    ED Course/ Medical Decision Making/ A&P Clinical Course as of 12/08/23 1039  Sun Dec 08, 2023  8119 Echo 2019: "Study Conclusions   -  Left ventricle: The cavity size was normal. Systolic function was    normal. The estimated ejection fraction was in the range of 60%    to 65%. Wall motion was normal; there were no regional wall    motion abnormalities. Left ventricular diastolic function    parameters were normal.  - Atrial septum: No defect or patent foramen ovale was identified.  - Pulmonary arteries: PA peak pressure: 47 mm Hg (S). "  [TY]  0753 Patient hypothermic and with elevated lactic. CXR on my review without overt pneumonia. Is complaining of abdominal pain. Will start empiric antibiotics for sepsis clinical picture.  [TY]  0806 WBC: 9.6 [TY]  0836 Creatinine(!): 1.67 Was 1.3 earlier this year.   [TY]  [id number] Potassium(!): 3.1 Repleted  [TY]  1032 IMPRESSION: 1. No acute intrathoracic, abdominal, or pelvic pathology. No CT evidence of pulmonary artery embolus. 2. Sigmoid diverticulosis. No bowel obstruction. 3. Fatty liver. 4.  Aortic Atherosclerosis (ICD10-I70.0).   Electronically Signed   By: Angus Bark M.D.   On: 12/08/2023 10:28   [TY]    Clinical Course User Index [TY] Rolinda Climes, DO                                 Medical Decision Making This is an 83 year old female presenting emergency department after a fall this morning.  Has chronic back pain issues and takes gabapentin  and Percocets.  She was notably hypoxic 88% in triage, placed on 2 L with improvement of her oxygen saturation.  She is hypothermic, nontachycardic and hemodynamically stable.  No overt sign of infection although did had a strong smell of urine.  No overt signs of trauma, however patient seemingly calls out in pain no matter where you touch her.  Husband states that this is typical behavior for her.  With her worsening confusion/dementia in the setting of hypothermia and hypoxia labs obtained.  She did had an elevated lactate for which empiric antibiotics were started.  However no leukocytosis.  Minor elevation in her lipase, but no significant abdominal tenderness on exam.  No transaminitis to suggest hepatobiliary disease.  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  Given her abdominal pain CT scan obtained.  No significant findings however on either imaging.  Her CT head without acute intracranial pathology.  She does have mild low potassium which was repleted and an AKI.  Urine is pending.  Given patient's delirium type picture with AKI and SIRS.  Will admit for further IV hydration and antibiotics until blood cultures result.  Amount and/or Complexity of Data Reviewed Labs: ordered.  Decision-making details documented in ED Course. Radiology: ordered. ECG/medicine tests: ordered.  Risk Prescription drug management. Decision regarding hospitalization. Diagnosis or treatment significantly limited by social determinants of health. Risk Details: Dementia; ambulatory dysfunction at baseline.           Final Clinical Impression(s) / ED Diagnoses Final diagnoses:  AKI (acute kidney injury) (HCC)  SIRS (systemic inflammatory response syndrome) (HCC)  Fall, initial encounter    Rx / DC Orders ED Discharge Orders     None         Rolinda Climes, DO 12/08/23 1039

## 2023-12-08 NOTE — ED Notes (Signed)
 CBG 108.

## 2023-12-08 NOTE — Assessment & Plan Note (Signed)
 3 days of abdominal pain. CT abdomen/pelvis negative for acute pathology. Sigmoid diverticulosis w/ no obstruction and fatty liver.  Hx of constipation for the past week. Abd soft, TTP primarily over descending colon and umbilicus and bowel sounds normoactive.  - Miralax  17g daily - Monitor symptoms

## 2023-12-08 NOTE — H&P (Cosign Needed Addendum)
 Hospital Admission History and Physical Service Pager: [phone]  Patient name: Sandra [name] Medical record number: [id number] Date of Birth: [birth year] Age: 83 y.o. Gender: female  Primary Care Provider: Olin Bertin, MD Consultants: None Code Status: DNR/DNI Preferred Emergency Contact:  Contact Information     Name Relation Home Work [place] Spouse [phone]  [phone]    Name Relation Home Work [place] Son   [phone]    Chief Complaint: Fall/delirium  Assessment and Plan: Sandra [name] is a 83 y.o. female presenting s/p fall without LOC or head strike.  Recent history of worsening dementia.  Patient met SIRS criteria in the ED (hypothermia and tachypnea).  Differential for this patient's presentation of this includes: SIRS - possibly secondary to a urinary source with complaints of dysuria and odorous urine in the ED. UA negative for nitrites and leukocytes, but positive for proteinuria, ketonuria, and rare bacteria. Urine culture pending. LA elevated to 3.0 and 2.2. S/p LR bolus x2 and IV broad spectrum abx. AKI - Cr 1.67, likely prerenal 2/2 dehydration in the setting of acute appetite suppression. S/p LR bolus x2, will continue maintenance IVF. Delirium - History of dementia on which she is on donepezil and divalproex. Per husband patient has been declining recently with her mentation, appetite, and willingness to practice ADLs. Assessment & Plan SIRS (systemic inflammatory response syndrome) (HCC) Patient met SIRS criteria with hypothermia and tachypnea, requiring 2L [place].  Lactic acid elevated to 3.0>2.2.  UA negative for nitrites and leukocytes. S/p LR bolus x2 and IV broad spectrum abx in the ED (Cefepime, Vancomycin , and Flagyl) - Admit to FMTS, attending Dr. Bevin Bucks - Med-Surg, Vital signs per floor - Regular diet - PT/OT to treat, will likely need a TOC consult for SNF. Husband reports that it has been increasingly difficult  for him to care for her - VTE prophylaxis: Lovenox 30 mg  - Fluids: LR 130 mL/hr - Continue antibiotics: transitioning to CTX this PM - Blood culture pending - Urine culture pending - CK wnl (121) - AM CBC/BMP - Fall precautions - Delirium precautions AKI (acute kidney injury) (HCC) Cr elevated to 1.67. S/p LR bolus.  - Maintenance IVF: LR 130 mL/hr - AM BMP Abdominal pain 3 days of abdominal pain. CT abdomen/pelvis negative for acute pathology. Sigmoid diverticulosis w/ no obstruction and fatty liver.  Hx of constipation for the past week. Abd soft, TTP primarily over descending colon and umbilicus and bowel sounds normoactive.  - Miralax  17g daily - Monitor symptoms Chronic health problem Dementia: Currently at her baseline, husband notices improvement s/p fluids and abx. On Donepezil and Divalproex at home, will continue during admission. Osteopenia: Was previously on Alendronate. S/p knee and hip replacement. Chronic Back Pain: Takes oxycodone  q4h prn, normally takes it BID. Ordered Oxycodone  5 mg q4h prn for moderate pain.   FEN/GI: Regular Diet VTE Prophylaxis: Lovenox  Disposition: Med-Surg  History of Present Illness:  Sandra [name] is a 83 y.o. female presenting after a fall at home.  Her husband was trying to get her into bed this morning and she fell backwards onto her butt after trying to take a step onto the stool to get onto the bed.  Patient had no loss of consciousness nor did she hit her head.  Husband notes that there have been several falls over the past several weeks, which her husband has to call 911 to help him get her up and  her dementia seems to have worsened due to some delusions he has noted.  Patient has chronic weakness in her legs. Constipated over the last week or so. Endorses dysuria, but no frequency of urgency. Normally she can get up and go to the bathroom, but the last few days she hasn't been able to go to the bathroom herself and husband has been  using a chuck pad since it difficult for him to lift her. Appetite has been severely depressed as well.   Endorses abdominal pain for the past 3 days, with little to no bowel movements over the last week.  Denies fevers, headaches, vision changes, fevers, chills, chest pain, shortness of breath, nausea, vomiting, diarrhea.  In the ED, patient was hypothermic, hypoxic (lowest 76%) but improved with 2L [place], tachycardic (104) and normotensive.  Lactic acid elevated to 3.0 with repeat of 2.2.  CBC with elevated hemoglobin to 17.3, but no leukocytosis.  CMP with hypokalemia to 3.1, bilirubin 1.3, creatinine of 1.67, and AG 17.  Lipase elevated to 95.  CXR was negative for acute pathology.  CT abdomen pelvis and CTA chest with no acute pathology, sigmoid diverticulosis with no obstruction, fatty liver, and aortic atherosclerosis.  CT head without contrast revealed atrophy and small vessel ischemic changes but no intracranial process noted.  Patient was treated with IV antibiotics: Cefepime 2 g, vancomycin  1000 mg, and Flagyl 500 mg.  2 boluses of LR administered as well.  Review Of Systems: Per HPI  Pertinent Past Medical History: Osteopenia Dementia Chronic back pain Remainder reviewed in history tab.   Pertinent Past Surgical History: Abdominal hysterectomy and tubal ligation Appendectomy Back surgery Total hip arthroplasty Total knee arthroplasty Remainder reviewed in history tab.   Pertinent Social History: Tobacco use: Former, stopped 10 years ago; used to smoke 2-3 PPD for most of her life Alcohol  use: previously socially, but none now. Other Substance use: none Lives with husband and grandson  Pertinent Family History: Mother: Heart issues  Remainder reviewed in history tab.   Important Outpatient Medications: Gabapentin  400 mg in AM and 800 mg in PM (doesn't always take them) Donepezil 10 mg daily Divalproex 125 mg BID Oxycodone  10 mg every 4 hours as needed; normally takes one  in AM and one in PM Refresh OP every 4 hours as needed for dry eyes ** Meds last taken last night **  Remainder reviewed in medication history.   Objective: BP 132/62   Pulse 67   Temp 98.1 F (36.7 C) (Oral)   Resp 14   Ht 5\' 2"  (1.575 m)   Wt 90 kg   SpO2 100%   BMI 36.29 kg/m  Exam: General: Awake and Alert in NAD HEENT: NCAT.  EOMI.  Pinpoint pupils.  Sclera anicteric. No rhinorrhea. Cardiovascular: RRR. No M/R/G Respiratory: CTAB, normal WOB on RA. No wheezing, crackles, rhonchi, or diminished breath sounds. Abdomen: Soft, mild TTP over descending colon and umbilicus, non-distended. Bowel sounds normoactive Extremities: Able to move all extremities. No BLE edema, no deformities or significant joint findings. Skin: Warm and dry. No abrasions or rashes noted. Neuro: A&Ox3 (name, location, time). CN II: Pinpoint pupils CN III, IV,VI: EOMI CV V: Normal sensation in V1, V2, V3 CVII: Symmetric smile CN VIII: Normal hearing CN IX,X: Symmetric palate raise  CN XI: 5/5 shoulder shrug CN XII: Symmetric tongue protrusion  UE 5/5 and LE strength couldn't assess since she wouldn't lift up her legs Normal sensation in UE and LE bilaterally   Labs:  CBC BMET  Recent Labs  Lab 12/08/23 0657  WBC 9.6  HGB 17.3*  HCT 52.5*  PLT 230   Recent Labs  Lab 12/08/23 0657  NA 142  K 3.1*  CL 99  CO2 26  BUN 15  CREATININE 1.67*  GLUCOSE 124*  CALCIUM  9.5    Lactate: 3.0 > 2.2 Lipase: 95  EKG: My own interpretation: Regular rate and rhythm.  No QTc prolongation.  No ST elevation.  Imaging Studies Performed: CXR: No acute cardiopulmonary abnormality  CT head without contrast: Atrophy and chronic small vessel ischemic changes. No acute intracranial process identified.  CT abdomen pelvis with contrast & CTPE 1. No acute intrathoracic, abdominal, or pelvic pathology. No CT evidence of pulmonary artery embolus. 2. Sigmoid diverticulosis. No bowel obstruction. 3. Fatty  liver. 4. Aortic Atherosclerosis (ICD10-I70.0).  Clyda Dark, DO 12/08/2023, 2:50 PM PGY-1, [facility]  FPTS Intern pager: [phone], text pages welcome Secure chat group [facility] Teaching Service    FPTS Upper-Level Resident Addendum   I have independently interviewed and examined the patient. I have discussed the above with Dr. Randeen Busman and agree with the documented plan. My edits for correction/addition/clarification are included above. Please see any attending notes.   Edison Gore, MD PGY-2, [facility] 12/08/2023 2:55 PM  FPTS Service pager: [phone] (text pages welcome through [facility])

## 2023-12-08 NOTE — Assessment & Plan Note (Addendum)
 Patient met SIRS criteria with hypothermia and tachypnea, requiring 2L [place].  Lactic acid elevated to 3.0>2.2.  UA negative for nitrites and leukocytes. S/p LR bolus x2 and IV broad spectrum abx in the ED (Cefepime, Vancomycin , and Flagyl) - Admit to FMTS, attending Dr. Bevin Bucks - Med-Surg, Vital signs per floor - Regular diet - PT/OT to treat, will likely need a TOC consult for SNF. Husband reports that it has been increasingly difficult for him to care for her - VTE prophylaxis: Lovenox 30 mg  - Fluids: LR 130 mL/hr - Continue antibiotics: transitioning to CTX this PM - Blood culture pending - Urine culture pending - CK wnl (121) - AM CBC/BMP - Fall precautions - Delirium precautions

## 2023-12-08 NOTE — Plan of Care (Signed)
 FMTS Brief Progress Note  S:Annoyed but well appearing. Husband reports that she is getting agitated with the more frequent checks and requests something to help her sleep.   O: BP (!) 123/56 (BP Location: Right Arm)   Pulse 67   Temp 98.3 F (36.8 C) (Oral)   Resp 20   Ht 5\' 2"  (1.575 m)   Wt 90 kg   SpO2 100%   BMI 36.29 kg/m   General: A&O, NAD Cardiac: RRR, no m/r/g Respiratory: CTAB, normal WOB, no w/c/r GI: Soft, NTTP, non-distended  Extremities: extremely tender to light touch, this is her baseline per husband, no peripheral edema.  A/P: AMS/Frequent falls/SIRS  Likely due to UTI, follow urine and blood cultures -redose ABX tomorrow - start seroquel 25 mg at bedtime - start gabapentin  100 mg in setting of mild AKI -monitor for hospital delirium tonight given AMS - Orders reviewed. Labs for AM ordered, which was adjusted as needed.  - If condition changes, plan includes reassessment, consider haldol for agitation if needed.   Rayma Calandra, DO 12/08/2023, 7:47 PM PGY-1, [facility] Night Resident  Please page [phone] with questions.

## 2023-12-08 NOTE — ED Triage Notes (Signed)
 Husband called EMS d/t patient having frequent falls recently and increased confusion with dementia over past 2 weeks. Husband reports feeling like he can't take care of her well any more with her health and cognition continuing to deteriorate. Urine odor noted on arrival. Pt alert to self, disoriented to place/time/event. Pt reports nausea and pain all over.

## 2023-12-08 NOTE — ED Notes (Signed)
 Patient transported to CT

## 2023-12-08 NOTE — ED Notes (Signed)
 PT had a incontinent episode. PT was cleaned and dried. PT has visible redness in her vaginal area and vaginal area looks inflamed.Husband and son express wanting to speak to social worker to get help in caring for PT. Husband states he can't take care of her.

## 2023-12-08 NOTE — Assessment & Plan Note (Signed)
 Dementia: Currently at her baseline, husband notices improvement s/p fluids and abx. On Donepezil and Divalproex at home, will continue during admission. Osteopenia: Was previously on Alendronate. S/p knee and hip replacement. Chronic Back Pain: Takes oxycodone  q4h prn, normally takes it BID. Ordered Oxycodone  5 mg q4h prn for moderate pain.

## 2023-12-08 NOTE — Hospital Course (Addendum)
 Sandra [name] is a 82 y.o.female with a history of dementia, osteopenia, chronic back pain who was admitted to the family medicine teaching Service at [facility] due to a fall at home. Her hospital course is detailed below:  Fall Dementia Poor p.o. intake Patient fell at home without LOC or striking her head.  Has a history of worsening dementia with steady decline since 2020 and poor p.o. intake over the last month in addition to multiple falls at home. Met criteria for severe sepsis on admission due to report of acutely worsening mental status, AKI, slightly elevated lactic acid.  Received fluid bolus and IV antibiotics.  Patient had reported dysuria but there was no evidence of acute UTI on UA and there was no leukocytosis as well as abnormal vitals.  Symptoms were likely due to dehydration in setting of poor p.o. intake.  Antibiotics were discontinued the following morning due to no evidence of acute infection and patient at mental status baseline per family.  Patient and husband met with palliative care, decision was made to transfer patient to DNR comfort care.  Medical interventions were discontinued at this time, palliative care managed patient's pain regimen during hospitalization (oxycodone  10 mg every 4 hours, IV morphine  for breakthrough pain).  Patient was discharged to [facility] on 12/13/23.    Other chronic conditions were medically managed with home medications and formulary alternatives as necessary (none)  PCP Follow-up Recommendations: N/a - hospice care

## 2023-12-09 ENCOUNTER — Encounter (HOSPITAL_COMMUNITY): Payer: Self-pay | Admitting: Family Medicine

## 2023-12-09 DIAGNOSIS — J9601 Acute respiratory failure with hypoxia: Secondary | ICD-10-CM

## 2023-12-09 DIAGNOSIS — N179 Acute kidney failure, unspecified: Secondary | ICD-10-CM | POA: Diagnosis not present

## 2023-12-09 DIAGNOSIS — R638 Other symptoms and signs concerning food and fluid intake: Secondary | ICD-10-CM | POA: Insufficient documentation

## 2023-12-09 LAB — CBC
HCT: 41.2 % (ref 36.0–46.0)
Hemoglobin: 13.2 g/dL (ref 12.0–15.0)
MCH: 29.9 pg (ref 26.0–34.0)
MCHC: 32 g/dL (ref 30.0–36.0)
MCV: 93.4 fL (ref 80.0–100.0)
Platelets: 167 10*3/uL (ref 150–400)
RBC: 4.41 MIL/uL (ref 3.87–5.11)
RDW: 13.1 % (ref 11.5–15.5)
WBC: 7.5 10*3/uL (ref 4.0–10.5)
nRBC: 0 % (ref 0.0–0.2)

## 2023-12-09 LAB — BASIC METABOLIC PANEL WITH GFR
Anion gap: 9 (ref 5–15)
BUN: 10 mg/dL (ref 8–23)
CO2: 25 mmol/L (ref 22–32)
Calcium: 8 mg/dL — ABNORMAL LOW (ref 8.9–10.3)
Chloride: 104 mmol/L (ref 98–111)
Creatinine, Ser: 1.06 mg/dL — ABNORMAL HIGH (ref 0.44–1.00)
GFR, Estimated: 53 mL/min — ABNORMAL LOW (ref >60)
Glucose, Bld: 93 mg/dL (ref 70–99)
Potassium: 3.4 mmol/L — ABNORMAL LOW (ref 3.5–5.1)
Sodium: 138 mmol/L (ref 135–145)

## 2023-12-09 LAB — HEPATIC FUNCTION PANEL
ALT: 19 U/L (ref 0–44)
AST: 24 U/L (ref 15–41)
Albumin: 2.6 g/dL — ABNORMAL LOW (ref 3.5–5.0)
Alkaline Phosphatase: 40 U/L (ref 38–126)
Bilirubin, Direct: 0.1 mg/dL (ref 0.0–0.2)
Indirect Bilirubin: 0.5 mg/dL (ref 0.3–0.9)
Total Bilirubin: 0.6 mg/dL (ref 0.0–1.2)
Total Protein: 5.6 g/dL — ABNORMAL LOW (ref 6.5–8.1)

## 2023-12-09 LAB — URINE CULTURE: Culture: NO GROWTH

## 2023-12-09 LAB — MAGNESIUM: Magnesium: 1.8 mg/dL (ref 1.7–2.4)

## 2023-12-09 MED ORDER — ENSURE PLUS HIGH PROTEIN PO LIQD
237.0000 mL | Freq: Two times a day (BID) | ORAL | Status: DC
  Administered 2023-12-11: 237 mL via ORAL

## 2023-12-09 MED ORDER — ACETAMINOPHEN 325 MG PO TABS
650.0000 mg | ORAL_TABLET | Freq: Four times a day (QID) | ORAL | Status: DC
  Administered 2023-12-09 – 2023-12-13 (×11): 650 mg via ORAL
  Filled 2023-12-09 (×10): qty 2

## 2023-12-09 MED ORDER — POTASSIUM CHLORIDE 20 MEQ PO PACK
40.0000 meq | PACK | Freq: Once | ORAL | Status: AC
  Administered 2023-12-09: 40 meq via ORAL
  Filled 2023-12-09: qty 2

## 2023-12-09 MED ORDER — ENOXAPARIN SODIUM 40 MG/0.4ML IJ SOSY
40.0000 mg | PREFILLED_SYRINGE | INTRAMUSCULAR | Status: DC
  Administered 2023-12-09: 40 mg via SUBCUTANEOUS
  Filled 2023-12-09: qty 0.4

## 2023-12-09 MED ORDER — ACETAMINOPHEN 650 MG RE SUPP: 650.0000 mg | Freq: Four times a day (QID) | RECTAL | Status: DC

## 2023-12-09 NOTE — Evaluation (Signed)
 Physical Therapy Evaluation  Patient Details Name: Sandra [name] MRN: [id number] DOB: 05-Mar-1941 Today's Date: 12/09/2023  History of Present Illness  Pt is an 83 y/o female who presents from home with husband 12/08/2023 s/p fall. Husband not present on eval, however per chart review, pt with a recent decline in mentation, appetite, and participation in ADL's. PMH significant for Chronic back pain s/p back surgery 2009/2010, DVT, MRSA, osteopenia, urinary urgency s/p bladder tacked 2009, R THA, R TKA, sensory deficits BLE's.   Clinical Impression  Pt admitted with above diagnosis. Pt currently with functional limitations due to the deficits listed below (see PT Problem List). At the time of PT eval pt was able to perform transfers with up to +2 max assist, and a third person for hygiene after BM on the [facility]. Pt initially with incontinence of stool in the bed but was able to finish on the [facility]. Pt limited by reported pain (inconsistent response verbally vs physically), cognition vs behavior, and generalized weakness. Recommend continued skilled therapy services <3 hours/day at d/c. Acutely, pt will benefit from acute skilled PT to increase their independence and safety with mobility to allow discharge.           If plan is discharge home, recommend the following: Two people to help with walking and/or transfers;Two people to help with bathing/dressing/bathroom;Assistance with cooking/housework;Direct supervision/assist for medications management;Direct supervision/assist for financial management;Assist for transportation;Help with stairs or ramp for entrance;Supervision due to cognitive status   Can travel by private vehicle   No    Equipment Recommendations Hoyer lift;Hospital bed;Wheelchair (measurements PT);Wheelchair cushion (measurements PT);BSC/3in1 (If returning home)  Recommendations for Other Services       Functional Status Assessment Patient has had a recent decline in their functional  status and demonstrates the ability to make significant improvements in function in a reasonable and predictable amount of time.     Precautions / Restrictions Precautions Precautions: Fall Recall of Precautions/Restrictions: Impaired Restrictions Weight Bearing Restrictions Per Provider Order: No      Mobility  Bed Mobility Overal bed mobility: Needs Assistance Bed Mobility: Supine to Sit     Supine to sit: Max assist, +2 for physical assistance, HOB elevated     General bed mobility comments: Pt with minimal active movement. Did advance LE's somewhat towards EOB however most of transfer was completed with +2 assist and bed pad for scooting.sliding.    Transfers Overall transfer level: Needs assistance Equipment used: 2 person hand held assist Transfers: Sit to/from Stand, Bed to chair/wheelchair/BSC Sit to Stand: Max assist, +2 physical assistance Stand pivot transfers: +2 physical assistance, Mod assist Step pivot transfers: Max assist, +2 physical assistance       General transfer comment: +2 assist for transfer to standing and pivot to [facility]. Significant assist to guide hips around as pt not advancing feet or attempting to shift weight to move hips towards BSC seat. +3 required for hygiene and stand from [facility], and pt was able to take 2 small advancing steps from [facility] to recliner. Again, significant assist required to guide hips around to sit in the chair.    Ambulation/Gait               General Gait Details: Unable  Stairs            Wheelchair Mobility     Tilt Bed    Modified Rankin (Stroke Patients Only)       Balance Overall balance assessment: Needs assistance Sitting-balance support: Bilateral  upper extremity supported, Feet supported Sitting balance-Leahy Scale: Poor Sitting balance - Comments: Pt reports feeling like she is going to fall when sitting EOB. No significant posterior lean or lateral lean noted during this time.   Standing  balance support: Bilateral upper extremity supported, During functional activity Standing balance-Leahy Scale: Zero Standing balance comment: +2 assist required                             Pertinent Vitals/Pain Pain Assessment Pain Assessment: Faces Faces Pain Scale: Hurts a little bit Pain Descriptors / Indicators: Other (Comment) (Pt calling out and stating "momma she's hurting me" during light touch throughout attempts at mobility. Faces pain scale 2/10 and verbal response appears inconsistent with physical response (pt not pulling away, grimacing, guarding with touch).) Pain Intervention(s): Limited activity within patient's tolerance, Monitored during session, Repositioned    Home Living Family/patient expects to be discharged to:: Skilled nursing facility Living Arrangements: Spouse/significant other;Children                 Additional Comments: Pt is a poor historian and is not able to provide reliable information when asked about home set up.    Prior Function Prior Level of Function : Needs assist;Patient poor historian/Family not available             Mobility Comments: Pt reports she was "running wild" at home, but unable to provide reliable information. ADLs Comments: Per chart review, pt with decreasing participation in ADL's at home.     Extremity/Trunk Assessment   Upper Extremity Assessment Upper Extremity Assessment: Defer to OT evaluation    Lower Extremity Assessment Lower Extremity Assessment: Generalized weakness (Per chart review, BLE N/T at baseline. Pt did not complain of this during session. Gross general weakness.)    Cervical / Trunk Assessment Cervical / Trunk Assessment: Other exceptions Cervical / Trunk Exceptions: Forward head posture with rounded shoulders  Communication   Communication Communication: No apparent difficulties    Cognition Arousal: Alert Behavior During Therapy: Anxious   PT - Cognitive impairments:  History of cognitive impairments                         Following commands: Impaired Following commands impaired: Follows one step commands inconsistently     Cueing Cueing Techniques: Verbal cues, Gestural cues, Tactile cues     General Comments      Exercises     Assessment/Plan    PT Assessment Patient needs continued PT services  PT Problem List Decreased strength;Decreased activity tolerance;Decreased balance;Decreased mobility;Decreased knowledge of use of DME;Decreased safety awareness;Decreased knowledge of precautions;Pain       PT Treatment Interventions DME instruction;Gait training;Functional mobility training;Therapeutic activities;Therapeutic exercise;Balance training;Patient/family education    PT Goals (Current goals can be found in the Care Plan section)  Acute Rehab PT Goals Patient Stated Goal: For her husband to come back to the hospital PT Goal Formulation: Patient unable to participate in goal setting Time For Goal Achievement: 12/23/23 Potential to Achieve Goals: Fair    Frequency Min 1X/week     Co-evaluation PT/OT/SLP Co-Evaluation/Treatment: Yes Reason for Co-Treatment: Complexity of the patient's impairments (multi-system involvement);Necessary to address cognition/behavior during functional activity;For patient/therapist safety;To address functional/ADL transfers PT goals addressed during session: Mobility/safety with mobility;Balance;Strengthening/ROM         AM-PAC PT "6 Clicks" Mobility  Outcome Measure Help needed turning from your back to your side while  in a flat bed without using bedrails?: A Lot Help needed moving from lying on your back to sitting on the side of a flat bed without using bedrails?: Total Help needed moving to and from a bed to a chair (including a wheelchair)?: Total Help needed standing up from a chair using your arms (e.g., wheelchair or bedside chair)?: Total Help needed to walk in hospital room?:  Total Help needed climbing 3-5 steps with a railing? : Total 6 Click Score: 7    End of Session Equipment Utilized During Treatment: Gait belt;Oxygen Activity Tolerance: Other (comment) (limited by cognition/behavior) Patient left: in chair;with call bell/phone within reach;with chair alarm set Nurse Communication: Mobility status PT Visit Diagnosis: Unsteadiness on feet (R26.81);Difficulty in walking, not elsewhere classified (R26.2)    Time: 5784-6962 PT Time Calculation (min) (ACUTE ONLY): 36 min   Charges:   PT Evaluation $PT Eval Moderate Complexity: 1 Mod   PT General Charges $$ ACUTE PT VISIT: 1 Visit         Simone Dubois, PT, DPT Acute Rehabilitation Services Secure Chat Preferred Office: [phone]   Venus Ginsberg 12/09/2023, 12:16 PM

## 2023-12-09 NOTE — Assessment & Plan Note (Addendum)
 Stable, improving.  Afebrile and VSS currently.  -- Continue ceftriaxone 2 g daily -- Follow-up blood cultures, urine cultures -- Decrease IV fluids to 65 mL/h LR -- Scheduled tylenol  650mg  -- AM CBC, BMP -- Fall and delirium precautions -- PT/OT eval and treat -- TOC consult for SNF placement given barriers to at-home care -- Palliative consult given dementia and GOC

## 2023-12-09 NOTE — Assessment & Plan Note (Addendum)
 Creatinine elevated 1.67 on admission, baseline appears to be around 1.0.  S/p 2L LR and on continuous IVF. Improved to  -- Maintenance IVF as above, PO fluids as above -- CTM with a.m. BMP

## 2023-12-09 NOTE — Assessment & Plan Note (Addendum)
 Husband reports markedly decreased appetite, admission labs reveal dehydration (including elevated total bili 1.3) --Nursing order to assist with feeds -- Nursing order to offer 250cc fluids every 4 hours --AM hepatic function panel --Routine I/Os -- Reduce IV fluids to 65 mL/hr

## 2023-12-09 NOTE — Plan of Care (Signed)

## 2023-12-09 NOTE — Assessment & Plan Note (Addendum)
 Placed on 2 L [place] in ED due to hypoxia.  Oxygen saturations have been appropriate with benign pulmonary exam today. --Wean to room air as able

## 2023-12-09 NOTE — [place] FL2 (Signed)
 [place]  MEDICAID FL2 LEVEL OF CARE FORM     IDENTIFICATION  Patient Name: Sandra [name] Birthdate: 13-Nov-1940 Sex: female Admission Date (Current Location): 12/08/2023  [facility] and [place] Number:  Producer, television/film/video and Address:  [facility], [address] [place], [place] [address]      Provider Number: [id number]  Attending Physician Name and Address:  Azell Boll, MD  Relative Name and Phone Number:  Milton, Streicher (Spouse)  [phone]    Current Level of Care: Hospital Recommended Level of Care: Skilled Nursing Facility Prior Approval Number:    Date Approved/Denied:   PASRR Number: [id number]  Discharge Plan:      Current Diagnoses: Patient Active Problem List   Diagnosis Date Noted   Poor fluid intake 12/09/2023   Acute hypoxic respiratory failure (HCC) 12/09/2023   Severe sepsis (HCC) due to AKI, AMS, and oxygen requirement 12/08/2023   Chronic health problem 12/08/2023   Abdominal pain 12/08/2023   Displaced fracture of right femoral neck (HCC) 08/16/2017   Hip fracture (HCC) 08/13/2017   Fall 08/13/2017   Normocytic anemia 08/13/2017   DVT (deep venous thrombosis) (HCC) history of    Closed fracture of neck of right femur (HCC)    OA (osteoarthritis) of knee 07/29/2017   Other secondary scoliosis, thoracolumbar region 10/07/2014    Orientation RESPIRATION BLADDER Height & Weight     Self, Place  Normal Incontinent, External catheter Weight: 198 lb 6.6 oz (90 kg) Height:  5\' 2"  (157.5 cm)  BEHAVIORAL SYMPTOMS/MOOD NEUROLOGICAL BOWEL NUTRITION STATUS      Continent Diet (see DC summary)  AMBULATORY STATUS COMMUNICATION OF NEEDS Skin   Extensive Assist Verbally Normal                       Personal Care Assistance Level of Assistance  Bathing, Feeding, Dressing Bathing Assistance: Maximum assistance Feeding assistance: Limited assistance Dressing Assistance: Maximum assistance     Functional Limitations  Info  Sight, Hearing, Speech Sight Info: Impaired Hearing Info: Adequate Speech Info: Adequate    SPECIAL CARE FACTORS FREQUENCY  PT (By licensed PT), OT (By licensed OT)     PT Frequency: 5x/week OT Frequency: 5x/week            Contractures Contractures Info: Not present    Additional Factors Info  Code Status, Allergies Code Status Info: DNR Allergies Info: Clarithromycin Codeine Resinol (Dermatological Products, Misc.) Sulfa Antibiotics           Current Medications (12/09/2023):  This is the current hospital active medication list Current Facility-Administered Medications  Medication Dose Route Frequency Provider Last Rate Last Admin   acetaminophen  (TYLENOL ) tablet 650 mg  650 mg Oral Q6H Gomes, Adriana, DO   650 mg at 12/09/23 1136   Or   acetaminophen  (TYLENOL ) suppository 650 mg  650 mg Rectal Q6H Clyda Dark, DO       cefTRIAXone (ROCEPHIN) 2 g in sodium chloride  0.9 % 100 mL IVPB  2 g Intravenous Q24H Gomes, Adriana, DO 200 mL/hr at 12/08/23 2127 2 g at 12/08/23 2127   divalproex (DEPAKOTE SPRINKLE) capsule 125 mg  125 mg Oral Q12H Gomes, Adriana, DO   125 mg at 12/09/23 1023   donepezil (ARICEPT) tablet 10 mg  10 mg Oral QHS Gomes, Adriana, DO   10 mg at 12/08/23 2121   enoxaparin (LOVENOX) injection 40 mg  40 mg Subcutaneous Q24H Paytes, Hadassah Letters, RPH  feeding supplement (ENSURE PLUS HIGH PROTEIN) liquid 237 mL  237 mL Oral BID BM Azell Boll, MD       lactated ringers  infusion   Intravenous Continuous Clyda Dark, DO 65 mL/hr at 12/09/23 1555 Infusion Verify at 12/09/23 1555   oxyCODONE  (Oxy IR/ROXICODONE ) immediate release tablet 5 mg  5 mg Oral Q4H PRN Gomes, Adriana, DO   5 mg at 12/08/23 1551   polyethylene glycol (MIRALAX  / GLYCOLAX ) packet 17 g  17 g Oral Daily Clyda Dark, DO   17 g at 12/08/23 1543     Discharge Medications: Please see discharge summary for a list of discharge medications.  Relevant Imaging Results:  Relevant Lab  Results:   Additional Information [id number]  Ira Dougher A [place], LCSW

## 2023-12-09 NOTE — Assessment & Plan Note (Addendum)
 Dementia: Continue home donepezil, divalproex.  Do not recommend Seroquel at this time unless patient becomes agitated or a harm to herself or others.  Can use redirection or order melatonin/ramelteon for sleep PRN if needed. Osteopenia: Not on any meds.  S/p knee and hip replacement Chronic back pain: Takes oxycodone  4-hour as needed at home, usually twice daily.  Ordered oxycodone  5 mg every 4 as needed, patient has used 1 dose thus far. Will avoid gabapentin  due to ADEs

## 2023-12-09 NOTE — Assessment & Plan Note (Addendum)
 Reported 3 days abdominal pain with history of constipation in the past week per husband.  CTAP negative for acute findings, has sigmoid diverticulosis without obstruction and fatty liver.  Abdominal exam benign, possible TTP but hard to determine given baseline dementia.  Patient had diarrhea this morning after receiving MiraLAX  yesterday, could represent liquid stool traveling around stool ball. --Continue daily MiraLAX , monitor for ongoing diarrhea -- CTM

## 2023-12-09 NOTE — TOC Initial Note (Signed)
 Transition of Care [facility]) - Initial/Assessment Note    Patient Details  Name: Sandra [name] MRN: [id number] Date of Birth: 1941-05-06  [facility] CM/SW Contact:    Dakia Schifano A [place], LCSW Phone Number: 12/09/2023, 4:13 PM  Clinical Narrative:                  CSW spoke with pt, pt's spouse, Drexel Gentles and pt's sister at bedside. Pt is from home with spouse. Pt is oriented x2, CSW directed conversation to spouse, pt's HCPOA. He stated that he was agreeable to rehab at [facility], preference for [facility], as pt has been there in the past.   He said that he also was interested in [place] screening, CSW reached out to [facility] and completed referral.  Lastly, discussed palliative care following outpatient, stated he would be interested in the agency that the facility utilizes. CSW to provide referral once information has been obtained.   SNF workup completed, bed offers pending.   TOC will continue to follow.  Expected Discharge Plan: Skilled Nursing Facility Barriers to Discharge: Continued Medical Work up, SNF Pending bed offer   Patient Goals and CMS Choice            Expected Discharge Plan and Services                                              Prior Living Arrangements/Services              Need for Family Participation in Patient Care: Yes (Comment) Care giver support system in place?: Yes (comment) (pt's spouse Drexel Gentles)      Activities of Daily Living   ADL Screening (condition at time of admission) Independently performs ADLs?: No Does the patient have a NEW difficulty with bathing/dressing/toileting/self-feeding that is expected to last >3 days?: Yes (Initiates electronic notice to provider for possible OT consult) Does the patient have a NEW difficulty with getting in/out of bed, walking, or climbing stairs that is expected to last >3 days?: Yes (Initiates electronic notice to provider for possible PT consult) Does the patient have a NEW  difficulty with communication that is expected to last >3 days?: No Is the patient deaf or have difficulty hearing?: No Does the patient have difficulty seeing, even when wearing glasses/contacts?: No Does the patient have difficulty concentrating, remembering, or making decisions?: Yes  Permission Sought/Granted                  Emotional Assessment Appearance:: Disheveled Attitude/Demeanor/Rapport: Lethargic Affect (typically observed): Quiet Orientation: : Oriented to Place, Oriented to Self Alcohol  / Substance Use: Not Applicable Psych Involvement: No (comment)  Admission diagnosis:  SIRS (systemic inflammatory response syndrome) (HCC) [R65.10] AKI (acute kidney injury) (HCC) [N17.9] Fall, initial encounter [W19.XXXA] Patient Active Problem List   Diagnosis Date Noted   Poor fluid intake 12/09/2023   Acute hypoxic respiratory failure (HCC) 12/09/2023   Severe sepsis (HCC) due to AKI, AMS, and oxygen requirement 12/08/2023   Chronic health problem 12/08/2023   Abdominal pain 12/08/2023   Displaced fracture of right femoral neck (HCC) 08/16/2017   Hip fracture (HCC) 08/13/2017   Fall 08/13/2017   Normocytic anemia 08/13/2017   DVT (deep venous thrombosis) (HCC) history of    Closed fracture of neck of right femur (HCC)    OA (osteoarthritis) of knee 07/29/2017  Other secondary scoliosis, thoracolumbar region 10/07/2014   PCP:  No primary care provider on file. Pharmacy:   [id number] [place], [place] - [address] [place] [address] Phone: [phone] Fax: [phone]     Social Drivers of Health (SDOH) Social History: SDOH Screenings   Food Insecurity: No Food Insecurity (12/08/2023)  Housing: Low Risk  (12/08/2023)  Transportation Needs: No Transportation Needs (12/08/2023)  Utilities: Not At Risk (12/08/2023)  Tobacco Use: Medium Risk (12/08/2023)   SDOH Interventions:     Readmission Risk  Interventions     No data to display

## 2023-12-09 NOTE — Progress Notes (Addendum)
 Daily Progress Note Intern Pager: [phone]  Patient name: Sandra [name] Medical record number: [id number] Date of birth: 1940/10/16 Age: 83 y.o. Gender: female  Primary Care Provider: No primary care provider on file. Consultants: none Code Status: DNR-L  Pt Overview and Major Events to Date:  6/8 admitted to [facility], received cefepime, vanc, Flagyl in ED. started on CTX in PM  Assessment and Plan: Sandra [name] is an 83 year old female with PMH of dementia, osteopenia, chronic back pain who presented with a fall at home, met criteria for severe sepsis due to end organ damage with AKI, AMS (per husband, mental status worsen compared to baseline), and oxygen requirement (placed on 2 L [facility] in ED).  No obvious infectious source and patient has been afebrile without leukocytosis.  Report of dysuria prior to admission, UA negative for acute infection.  Will continue broad-spectrum antibiotics and maintenance fluids while awaiting results of urine culture (pending) and blood culture (NG x 1 day).  Patient also has AKI, elevated bilirubin, and polycythemia most likely due to dehydration and poor oral intake.  AKI has resolved after IVF administration, will trend hepatic function and CBCs.  Finally, husband reports worsening of dementia and difficulty caring for patient at home. Palliative consult for G[facility] and [facility] consult for SNF placement at discharge.  Assessment & Plan Severe sepsis (HCC) due to AKI, AMS, and oxygen requirement Stable, improving.  Afebrile and VSS currently.  -- Continue ceftriaxone 2 g daily -- Follow-up blood cultures, urine cultures -- Decrease IV fluids to 65 mL/h LR -- Scheduled tylenol  650mg  -- AM CBC, BMP -- Fall and delirium precautions -- PT/OT eval and treat -- TOC consult for SNF placement given barriers to at-home care -- Palliative consult given dementia and GOC Acute hypoxic respiratory failure (HCC) Placed on 2 L [place] in ED due to hypoxia.  Oxygen saturations  have been appropriate with benign pulmonary exam today. --Wean to room air as able Poor fluid intake Husband reports markedly decreased appetite, admission labs reveal dehydration (including elevated total bili 1.3) --Nursing order to assist with feeds -- Nursing order to offer 250cc fluids every 4 hours --AM hepatic function panel --Routine I/Os -- Reduce IV fluids to 65 mL/hr Abdominal pain Reported 3 days abdominal pain with history of constipation in the past week per husband.  CTAP negative for acute findings, has sigmoid diverticulosis without obstruction and fatty liver.  Abdominal exam benign, possible TTP but hard to determine given baseline dementia.  Patient had diarrhea this morning after receiving MiraLAX  yesterday, could represent liquid stool traveling around stool ball. --Continue daily MiraLAX , monitor for ongoing diarrhea -- CTM  AKI (acute kidney injury) (HCC) (Resolved: 12/09/2023) Creatinine elevated 1.67 on admission, baseline appears to be around 1.0.  S/p 2L LR and on continuous IVF. Improved to  -- Maintenance IVF as above, PO fluids as above -- CTM with a.m. BMP  Chronic health problem Dementia: Continue home donepezil, divalproex.  Do not recommend Seroquel at this time unless patient becomes agitated or a harm to herself or others.  Can use redirection or order melatonin/ramelteon for sleep PRN if needed. Osteopenia: Not on any meds.  S/p knee and hip replacement Chronic back pain: Takes oxycodone  4-hour as needed at home, usually twice daily.  Ordered oxycodone  5 mg every 4 as needed, patient has used 1 dose thus far. Will avoid gabapentin  due to ADEs   FEN/GI: Regular PPx: Lovenox 30 mg daily Dispo: Pending clinical improvement  Subjective:  Patient's husband not present during interview this morning. Patient states she feels okay, reports pain in her belly that has been present for "30 years."  Reports it may have gotten worse in the last few days.  Patient  had just worked with PT and was on the bedside commode, physical therapist report patient had diarrhea.  Patient received 25 mg seroquel overnight due to report of agitation per husband.  Also got 100 mg gabapentin  due to patient report of leg pain, takes this occasionally at home.  Objective: Temp:  [97.5 F (36.4 C)-98.3 F (36.8 C)] 97.6 F (36.4 C) (06/09 1025) Pulse Rate:  [62-77] 66 (06/09 1025) Resp:  [11-22] 18 (06/09 1025) BP: (100-146)/(47-74) 122/56 (06/09 1025) SpO2:  [98 %-100 %] 98 % (06/09 1025) Physical Exam: General: Sitting in recliner, awake and alert, answering some questions appropriately Cardiovascular: RRR, normal S1/2, no murmurs Respiratory: TAD, normal WOB on 2 L [place] Abdomen: Normoactive bowel sounds, soft, nondistended, no obvious TTP however exam limited by patient reporting tenderness even with light palpation Extremities: Unable to assess due to patient reporting severe pain with light touch, legs do not appear swollen Neuro: Moving all extremity spontaneously Psych: Demented, states "I am going to kick you" when attempting to examine lower extremities  Laboratory: Most recent CBC Lab Results  Component Value Date   WBC 7.5 12/09/2023   HGB 13.2 12/09/2023   HCT 41.2 12/09/2023   MCV 93.4 12/09/2023   PLT 167 12/09/2023   Most recent BMP    Latest Ref Rng & Units 12/09/2023    8:44 AM  BMP  Glucose 70 - 99 mg/dL 93   BUN 8 - 23 mg/dL 10   Creatinine 0.10 - 1.00 mg/dL 2.72   Sodium 536 - 644 mmol/L 138   Potassium 3.5 - 5.1 mmol/L 3.4   Chloride 98 - 111 mmol/L 104   CO2 22 - 32 mmol/L 25   Calcium  8.9 - 10.3 mg/dL 8.0     Other pertinent labs none  Imaging/Diagnostic Tests: None  Naida Austria, MD 12/09/2023, 11:01 AM  PGY-1, [facility] FPTS Intern pager: [phone], text pages welcome Secure chat group [facility] Teaching Service

## 2023-12-09 NOTE — Evaluation (Signed)
 Occupational Therapy Evaluation Patient Details Name: Sandra [name] MRN: [id number] DOB: May 20, 1941 Today's Date: 12/09/2023   History of Present Illness   Pt is an 83 y/o female who presents from home with husband 12/08/2023 s/p fall. Husband not present on eval, however per chart review, pt with a recent decline in mentation, appetite, and participation in ADL's. PMH significant for Chronic back pain s/p back surgery 2009/2010, DVT, MRSA, osteopenia, urinary urgency s/p bladder tacked 2009, R THA, R TKA, sensory deficits BLE's.     Clinical Impressions Pt questionable historian, hx dementia, from home with spouse where per chart review, pt was needing incr assist for ADL/mobility from spouse. Pt currently needing up to max A for ADLs, max +2 for bed mobility and max +2 for transfers with 2 person HHA. Pt frequently yelling out in pain, anxious with mobility, needs incr cues/motivation to participate and to remain on task during session. Pt presenting with impairments listed below, will follow acutely. Patient will benefit from continued inpatient follow up therapy, <3 hours/day to maximize safety/ind with ADL/functional mobility.      If plan is discharge home, recommend the following:   Two people to help with walking and/or transfers;A lot of help with bathing/dressing/bathroom;Assistance with cooking/housework;Direct supervision/assist for medications management;Direct supervision/assist for financial management;Assist for transportation;Help with stairs or ramp for entrance     Functional Status Assessment   Patient has had a recent decline in their functional status and demonstrates the ability to make significant improvements in function in a reasonable and predictable amount of time.     Equipment Recommendations   Other (comment) (defer)     Recommendations for Other Services   PT consult     Precautions/Restrictions   Precautions Precautions: Fall Recall of  Precautions/Restrictions: Impaired Restrictions Weight Bearing Restrictions Per Provider Order: No     Mobility Bed Mobility Overal bed mobility: Needs Assistance Bed Mobility: Supine to Sit     Supine to sit: Max assist, +2 for physical assistance, HOB elevated     General bed mobility comments: Pt with minimal active movement. Did advance LE's somewhat towards EOB however most of transfer was completed with +2 assist and bed pad for scooting.sliding.    Transfers Overall transfer level: Needs assistance Equipment used: 2 person hand held assist Transfers: Sit to/from Stand, Bed to chair/wheelchair/BSC Sit to Stand: Max assist, +2 physical assistance Stand pivot transfers: +2 physical assistance, Mod assist   Step pivot transfers: Max assist, +2 physical assistance            Balance Overall balance assessment: Needs assistance Sitting-balance support: Bilateral upper extremity supported, Feet supported Sitting balance-Leahy Scale: Fair     Standing balance support: Bilateral upper extremity supported, During functional activity Standing balance-Leahy Scale: Zero Standing balance comment: +2 assist required                           ADL either performed or assessed with clinical judgement   ADL Overall ADL's : Needs assistance/impaired Eating/Feeding: Set up;Sitting   Grooming: Minimal assistance;Sitting;Wash/dry hands   Upper Body Bathing: Minimal assistance;Sitting   Lower Body Bathing: Maximal assistance;Bed level   Upper Body Dressing : Minimal assistance;Sitting   Lower Body Dressing: Maximal assistance;Bed level   Toilet Transfer: Maximal assistance;+2 for physical assistance;Squat-pivot;BSC/3in1   Toileting- Clothing Manipulation and Hygiene: Total assistance       Functional mobility during ADLs: Maximal assistance;+2 for physical assistance  Vision   Vision Assessment?: No apparent visual deficits     Perception  Perception: Not tested       Praxis Praxis: Not tested       Pertinent Vitals/Pain Pain Assessment Pain Assessment: Faces Pain Score: 2  Faces Pain Scale: Hurts a little bit Pain Location: generalized with movement pt yelling out in pain Pain Descriptors / Indicators: Discomfort Pain Intervention(s): Limited activity within patient's tolerance, Monitored during session     Extremity/Trunk Assessment Upper Extremity Assessment Upper Extremity Assessment: Generalized weakness   Lower Extremity Assessment Lower Extremity Assessment: Defer to PT evaluation   Cervical / Trunk Assessment Cervical / Trunk Assessment: Other exceptions Cervical / Trunk Exceptions: Forward head posture with rounded shoulders   Communication Communication Communication: No apparent difficulties   Cognition Arousal: Alert Behavior During Therapy: Anxious Cognition: Cognition impaired, Difficult to assess Difficult to assess due to:  (behavioral)   Awareness: Online awareness impaired   Attention impairment (select first level of impairment): Focused attention   OT - Cognition Comments: pt unreliable historian states she runs wild at home and lives with her spouse, calling out for  "joesph" throughout session, also frequently referencing her mother "mama she's hurting me" or " my mama told me never to make a mess", difficult to rredirect/remain on task                 Following commands: Impaired Following commands impaired: Follows one step commands inconsistently     Cueing  General Comments   Cueing Techniques: Verbal cues;Gestural cues;Tactile cues  VSS   Exercises     Shoulder Instructions      Home Living Family/patient expects to be discharged to:: Skilled nursing facility Living Arrangements: Spouse/significant other;Children                               Additional Comments: Pt is a poor historian and is not able to provide reliable information when asked  about home set up.      Prior Functioning/Environment Prior Level of Function : Needs assist;Patient poor historian/Family not available             Mobility Comments: Pt reports she was "running wild" at home, but unable to provide reliable information. ADLs Comments: Per chart review, pt with decreasing participation in ADL's at home.    OT Problem List: Decreased strength;Decreased activity tolerance;Decreased range of motion;Impaired balance (sitting and/or standing);Decreased coordination;Decreased cognition;Decreased safety awareness;Cardiopulmonary status limiting activity   OT Treatment/Interventions: Therapeutic exercise;Self-care/ADL training;Energy conservation;DME and/or AE instruction;Therapeutic activities;Patient/family education;Balance training      OT Goals(Current goals can be found in the care plan section)   Acute Rehab OT Goals Patient Stated Goal: none stated OT Goal Formulation: With patient Time For Goal Achievement: 12/23/23 Potential to Achieve Goals: Good ADL Goals Pt Will Perform Upper Body Dressing: with min assist;sitting Pt Will Perform Lower Body Dressing: with min assist;with adaptive equipment;sitting/lateral leans;sit to/from stand Pt Will Transfer to Toilet: with min assist;ambulating;regular height toilet Additional ADL Goal #1: pt will follow 2 step command with min cues   OT Frequency:  Min 1X/week    Co-evaluation PT/OT/SLP Co-Evaluation/Treatment: Yes Reason for Co-Treatment: Complexity of the patient's impairments (multi-system involvement);Necessary to address cognition/behavior during functional activity;For patient/therapist safety;To address functional/ADL transfers PT goals addressed during session: Mobility/safety with mobility;Balance;Strengthening/ROM OT goals addressed during session: ADL's and self-care;Strengthening/ROM      AM-PAC OT "6 Clicks" Daily Activity  Outcome Measure Help from another person eating  meals?: A Little Help from another person taking care of personal grooming?: A Little Help from another person toileting, which includes using toliet, bedpan, or urinal?: A Lot Help from another person bathing (including washing, rinsing, drying)?: A Lot Help from another person to put on and taking off regular upper body clothing?: A Lot Help from another person to put on and taking off regular lower body clothing?: A Lot 6 Click Score: 14   End of Session Equipment Utilized During Treatment: Gait belt Nurse Communication: Mobility status;Other (comment) (O2 left  off with pt satting in 90s)  Activity Tolerance: Patient tolerated treatment well Patient left: in chair;with call bell/phone within reach;with chair alarm set  OT Visit Diagnosis: Unsteadiness on feet (R26.81);Other abnormalities of gait and mobility (R26.89);Muscle weakness (generalized) (M62.81)                Time: 8469-6295 OT Time Calculation (min): 36 min Charges:  OT General Charges $OT Visit: 1 Visit OT Evaluation $OT Eval Moderate Complexity: 1 Mod  Ranya Fiddler K, OTD, OTR/L SecureChat Preferred Acute Rehab [phone]   Krishay Faro K Koonce 12/09/2023, 12:30 PM

## 2023-12-10 DIAGNOSIS — Z7189 Other specified counseling: Secondary | ICD-10-CM | POA: Diagnosis not present

## 2023-12-10 DIAGNOSIS — N179 Acute kidney failure, unspecified: Secondary | ICD-10-CM | POA: Diagnosis not present

## 2023-12-10 DIAGNOSIS — Z515 Encounter for palliative care: Secondary | ICD-10-CM | POA: Diagnosis not present

## 2023-12-10 LAB — CBC
HCT: 42.2 % (ref 36.0–46.0)
Hemoglobin: 13.4 g/dL (ref 12.0–15.0)
MCH: 29.9 pg (ref 26.0–34.0)
MCHC: 31.8 g/dL (ref 30.0–36.0)
MCV: 94.2 fL (ref 80.0–100.0)
Platelets: 145 10*3/uL — ABNORMAL LOW (ref 150–400)
RBC: 4.48 MIL/uL (ref 3.87–5.11)
RDW: 13.2 % (ref 11.5–15.5)
WBC: 6.4 10*3/uL (ref 4.0–10.5)
nRBC: 0 % (ref 0.0–0.2)

## 2023-12-10 LAB — BASIC METABOLIC PANEL WITH GFR
Anion gap: 11 (ref 5–15)
BUN: 8 mg/dL (ref 8–23)
CO2: 24 mmol/L (ref 22–32)
Calcium: 8.2 mg/dL — ABNORMAL LOW (ref 8.9–10.3)
Chloride: 104 mmol/L (ref 98–111)
Creatinine, Ser: 0.91 mg/dL (ref 0.44–1.00)
GFR, Estimated: >60 mL/min (ref >60)
Glucose, Bld: 87 mg/dL (ref 70–99)
Potassium: 3.1 mmol/L — ABNORMAL LOW (ref 3.5–5.1)
Sodium: 139 mmol/L (ref 135–145)

## 2023-12-10 MED ORDER — POTASSIUM CHLORIDE 20 MEQ PO PACK
40.0000 meq | PACK | Freq: Once | ORAL | Status: DC
  Filled 2023-12-10: qty 2

## 2023-12-10 MED ORDER — MORPHINE SULFATE 10 MG/5ML PO SOLN: 5.0000 mg | ORAL | Status: DC | PRN

## 2023-12-10 MED ORDER — GLYCOPYRROLATE 0.2 MG/ML IJ SOLN: 0.2000 mg | INTRAMUSCULAR | Status: DC | PRN

## 2023-12-10 MED ORDER — HALOPERIDOL LACTATE 5 MG/ML IJ SOLN
0.5000 mg | INTRAMUSCULAR | Status: DC | PRN
  Administered 2023-12-12 (×2): 0.5 mg via INTRAVENOUS
  Filled 2023-12-10 (×2): qty 1

## 2023-12-10 MED ORDER — HALOPERIDOL LACTATE 2 MG/ML PO CONC: 0.5000 mg | ORAL | Status: DC | PRN

## 2023-12-10 MED ORDER — GLYCOPYRROLATE 1 MG PO TABS: 1.0000 mg | ORAL_TABLET | ORAL | Status: DC | PRN

## 2023-12-10 MED ORDER — HALOPERIDOL 1 MG PO TABS
0.5000 mg | ORAL_TABLET | ORAL | Status: DC | PRN
  Administered 2023-12-10 – 2023-12-11 (×2): 0.5 mg via ORAL
  Filled 2023-12-10 (×2): qty 1

## 2023-12-10 MED ORDER — ONDANSETRON 4 MG PO TBDP: 4.0000 mg | ORAL_TABLET | Freq: Four times a day (QID) | ORAL | Status: DC | PRN

## 2023-12-10 MED ORDER — LORAZEPAM 2 MG/ML IJ SOLN: 0.5000 mg | INTRAMUSCULAR | Status: DC | PRN

## 2023-12-10 MED ORDER — BIOTENE DRY MOUTH MT LIQD: 15.0000 mL | OROMUCOSAL | Status: DC | PRN

## 2023-12-10 MED ORDER — POLYVINYL ALCOHOL 1.4 % OP SOLN: 1.0000 [drp] | Freq: Four times a day (QID) | OPHTHALMIC | Status: DC | PRN

## 2023-12-10 MED ORDER — ONDANSETRON HCL 4 MG/2ML IJ SOLN: 4.0000 mg | Freq: Four times a day (QID) | INTRAMUSCULAR | Status: DC | PRN

## 2023-12-10 MED ORDER — MORPHINE SULFATE (PF) 2 MG/ML IV SOLN: 2.0000 mg | INTRAVENOUS | Status: DC | PRN

## 2023-12-10 MED ORDER — POTASSIUM CHLORIDE CRYS ER 10 MEQ PO TBCR
40.0000 meq | EXTENDED_RELEASE_TABLET | Freq: Once | ORAL | Status: AC
  Administered 2023-12-10: 20 meq via ORAL
  Filled 2023-12-10: qty 4

## 2023-12-10 MED ORDER — LORAZEPAM 2 MG/ML IJ SOLN
0.5000 mg | INTRAMUSCULAR | Status: DC | PRN
  Administered 2023-12-12: 0.5 mg via INTRAVENOUS
  Administered 2023-12-12 – 2023-12-13 (×3): 1 mg via INTRAVENOUS
  Filled 2023-12-10 (×4): qty 1

## 2023-12-10 NOTE — Assessment & Plan Note (Addendum)
 Urine culture with no growth, blood culture with no growth x 2 days.  Patient afebrile, VSS, no leukocytosis. PT/OT recommend SNF at discharge.  -- Discontinuing ceftriaxone as cultures negative  -- Appreciate TOC assistance with SNF placement --Fall and delirium precautions -- Appreciative palliative care ongoing recommendations

## 2023-12-10 NOTE — Progress Notes (Addendum)
 Daily Progress Note Intern Pager: [phone]  Patient name: Sandra [name] Medical record number: [id number] Date of birth: 1941-06-22 Age: 83 y.o. Gender: female  Primary Care Provider: No primary care provider on file. Consultants: Palliative Code Status: DNR limited  Pt Overview and Major Events to Date:  6/8: Admitted to FMTS, received cefepime, Vanco, Flagyl in ED due to concern for sepsis, received 1 dose ceftriaxone  Assessment and Plan: Sandra [name] is an 83 year old female with PMH dementia, osteopenia, chronic back pain who presented after a fall at home and met criteria for severe sepsis due to AKI (now resolved), AMS (appears to be at baseline currently), oxygen requirement (has been on RA since shortly after leaving ED).  Did not have any obvious infection source and cultures, blood cultures have been negative x 48 hours.  Patient was afebrile without leukocytosis, discontinuing antibiotics and IVF today.  Patient's husband would like patient to discharge to SNF, patient is currently medically stable for discharge. Assessment & Plan Severe sepsis (HCC) due to AKI, AMS, and oxygen requirement Urine culture with no growth, blood culture with no growth x 2 days.  Patient afebrile, VSS, no leukocytosis. PT/OT recommend SNF at discharge.  -- Discontinuing ceftriaxone as cultures negative  -- Appreciate TOC assistance with SNF placement --Fall and delirium precautions -- Appreciative palliative care ongoing recommendations Poor fluid intake 300 mL p.o. intake charted in last 24 hours.  Repeat total bilirubin normal at 0.6.  Palliative care consult placed yesterday -- Nursing order to assist with feeds and offer 250 cc fluids every 4 hours -- Routine I/os Acute hypoxic respiratory failure (HCC) (Resolved: 12/10/2023) Currently on room air  Abdominal pain Difficult to tell if patient is having active abdominal pain currently and underlying dementia though patient did complain  about this on admission.  Patient has only had 1 bowel movement charted, reportedly had diarrhea yesterday which could represent fluid traveling around stool ball. --Daily MiraLAX  -- Will add on senna tomorrow if patient has still not had a BM especially given chronic opioid use  Chronic health problem Dementia: Continue home donepezil, divalproex.  Do not recommend Seroquel at this time unless patient becomes agitated or a harm to herself or others.  Can use redirection or order melatonin/ramelteon for sleep PRN if needed. Osteopenia: Not on any meds.  S/p knee and hip replacement Chronic back pain: Takes oxycodone  4-hour as needed at home, usually twice daily.  Ordered oxycodone  5 mg every 4 as needed, patient has used 1 dose thus far. Will avoid gabapentin  due to ADEs   FEN/GI: Regular PPx: Lovenox 40 mg daily Dispo: SNF pending bed availability  Subjective:  Patient's husband not present in room on interview.  Patient is sleepy this morning, RN states that patient had just met with palliative and was awake and alert during that interaction.  Patient was awake somewhat during the night as well.  Asked patient if she had an appetite for anything, patient states no. Patient refused abdominal and lower extremity exam.  Objective: Temp:  [97.6 F (36.4 C)-98.7 F (37.1 C)] 98.2 F (36.8 C) (06/10 0802) Pulse Rate:  [62-66] 65 (06/10 0802) Resp:  [18] 18 (06/10 0802) BP: (122-178)/(46-87) 178/84 (06/10 0802) SpO2:  [93 %-100 %] 97 % (06/10 0802) Physical Exam: General: Sitting up in bed sleeping comfortably, awakens to voice and answers questions with single word Cardiovascular: RRR, normal S1, S2, no murmurs, rubs, gallops Respiratory: CTAB, normal WB on RA, no wheezing, rales, rhonchi  Abdomen: Patient does not appear to be having abdominal pain, declines abdominal exam Extremities: Lower extremities do not appear edematous or abnormal otherwise, patient declines  exam  Laboratory: Most recent CBC Lab Results  Component Value Date   WBC 6.4 12/10/2023   HGB 13.4 12/10/2023   HCT 42.2 12/10/2023   MCV 94.2 12/10/2023   PLT 145 (L) 12/10/2023   Most recent BMP    Latest Ref Rng & Units 12/10/2023    5:04 AM  BMP  Glucose 70 - 99 mg/dL 87   BUN 8 - 23 mg/dL 8   Creatinine 1.61 - 0.96 mg/dL 0.45   Sodium 409 - 811 mmol/L 139   Potassium 3.5 - 5.1 mmol/L 3.1   Chloride 98 - 111 mmol/L 104   CO2 22 - 32 mmol/L 24   Calcium  8.9 - 10.3 mg/dL 8.2     Other pertinent labs: None  Imaging/Diagnostic Tests: None  Sandra Austria, MD 12/10/2023, 8:39 AM  PGY-1, [place] [facility] FPTS Intern pager: [phone], text pages welcome Secure chat group [facility] Teaching Service

## 2023-12-10 NOTE — Assessment & Plan Note (Addendum)
 Dementia: Continue home donepezil, divalproex.  Do not recommend Seroquel at this time unless patient becomes agitated or a harm to herself or others.  Can use redirection or order melatonin/ramelteon for sleep PRN if needed. Osteopenia: Not on any meds.  S/p knee and hip replacement Chronic back pain: Takes oxycodone  4-hour as needed at home, usually twice daily.  Ordered oxycodone  5 mg every 4 as needed, patient has used 1 dose thus far. Will avoid gabapentin  due to ADEs

## 2023-12-10 NOTE — Plan of Care (Signed)

## 2023-12-10 NOTE — Assessment & Plan Note (Addendum)
 Currently on room air

## 2023-12-10 NOTE — Assessment & Plan Note (Addendum)
 Difficult to tell if patient is having active abdominal pain currently and underlying dementia though patient did complain about this on admission.  Patient has only had 1 bowel movement charted, reportedly had diarrhea yesterday which could represent fluid traveling around stool ball. --Daily MiraLAX  -- Will add on senna tomorrow if patient has still not had a BM especially given chronic opioid use

## 2023-12-10 NOTE — Consult Note (Signed)
 Palliative Medicine Inpatient Consult Note  Consulting Provider:  Azell Boll, MD   Reason for consult:   Palliative Care Consult Services Palliative Medicine Consult  Reason for Consult? Advanced dementia reduced oral intake for 4 weeks husband interested in potential for home hospice pending her improvement   12/10/2023  HPI:  Per intake H&P --> Sandra [name] is an 83 year old female with PMH of dementia, osteopenia, chronic back pain who presented with a fall at home, met criteria for severe sepsis due to end organ damage with AKI, AMS (per husband, mental status worsen compared to baseline), and oxygen requirement (placed on 2 L [facility] in ED).  Palliative care has been asked to support additional goals of care conversations.   Clinical Assessment/Goals of Care:  *Please note that this is a verbal dictation therefore any spelling or grammatical errors are due to the "Dragon Medical One" system interpretation.  I have reviewed medical records including EPIC notes, labs and imaging, received report from bedside RN, assessed the patient who is lying in bed in NAD - endorses a lack of hunger oriented to person.    I met with patient spouse, Drexel Gentles and son, Guderian to further discuss diagnosis prognosis, GOC, EOL wishes, disposition and options.   I introduced Palliative Medicine as specialized medical care for people living with serious illness. It focuses on providing relief from the symptoms and stress of a serious illness. The goal is to improve quality of life for both the patient and the family.  Medical History Review and Understanding:  A review of Sandra [name]'s past medical history significant for dementia, chronic lower back pain, and osteopenia.  Social History:  Sandra [name] is from the [place] .  She formally worked in the [employer].  She has been married to her husband, Drexel Gentles for the past 37 years.  Sandra [name] has 2 children from a previous marriage and Drexel Gentles has 2  children from a previous marriage as well.  Sandra [name] is a woman of the [employer].  Functional and Nutritional State:  Preceding hospitalization Sandra [name] had been living at home with her husband though over the past 4 weeks had rapidly declined.  Her family shared about a year ago she afflicted COVID and since then has been predominantly bedbound.  She was able on occasion to make it 12 steps to the bathroom and still 12 steps back to her bedroom though this was a very slow process.  Thor Fling has had frequent recurrent falls requiring the fire department to come to her home to get her up.  Sandra [name] has had little to no appetite living off of 1 ginger ale every day or so in the home environment.  Per her spouse she neglects to acknowledge any self-monitor.  Advance Directives:\  A detailed discussion was had today regarding advanced directives.  Patient's surrogate decision maker is her spouse Sandra [name].  We have no formal advance directives on file otherwise.  Code Status:  Concepts specific to code status, artifical feeding and hydration, continued IV antibiotics and rehospitalization was had.  The difference between a aggressive medical intervention path  and a palliative comfort care path for this patient at this time was had.   I completed a MOST form today. The patient and family outlined their wishes for the following treatment decisions:  Cardiopulmonary Resuscitation: Do Not Attempt Resuscitation (DNR/No CPR)  Medical Interventions: Comfort Measures: Keep clean, warm, and dry. Use medication by any route, positioning, wound care, and other measures to relieve pain and  suffering. Use oxygen, suction and manual treatment of airway obstruction as needed for comfort. Do not transfer to the hospital unless comfort needs cannot be met in current location.  Antibiotics: No antibiotics (use other measures to relieve symptoms)  IV Fluids: No IV fluids (provide other measures to ensure comfort)  Feeding  Tube: No feeding tube   Discussion:  We reviewed that Sandra [name]'s decline in function began roughly 5 years ago about 6 months post retirement.  She got more forgetful to the point where she had little to no short-term memory.  Through affliction with various illnesses each has had a fairly profound effect on her cognitive and physical state.  Family again notes COVID, one year ago which inhibited patient from mobilizing frequently.  She has required assistance with all BADLs over the past few months and more notably in the last month has not been eating or drinking anything.  We discussed the type of dementia that Sandra [name] has though family has no confirmation of what type of dementia however they are able to provide information such as shuffling gait, circadian rhythm disturbance, frequent nontroubling hallucinations, and behavioral disturbances.  Based on what they have shared and how she has been experiencing symptoms and worsening of symptoms sometimes at rapid rates I would suspect she likely has Lewy body dementia.  We discussed the chronic disease trajectory and how we have seen a precipitous decline in Sandra [name]'s overall functional, nutritional, cognitive state.  We reviewed short and long-term outcomes and what family's goals are in the setting of patient's chronic disease burden.  Patient's family is very clear that they do not feel optimistic about her being able to work at [employer] with the physical therapy and mobility teams.  They share the patient has exemplified and vocalized being tired.  Patient's family would like to focus on comfort measures at this point in time and have her evaluated for inpatient hospice.  Discussed the importance of continued conversation with family and their  medical providers regarding overall plan of care and treatment options, ensuring decisions are within the context of the patients values and GOCs.  Decision Maker: Sandra [name] (Spouse): [phone] (Mobile)    SUMMARY OF RECOMMENDATIONS   DNAR/DNI  Open and honest conversations held with patient's family regarding her chronic disease burden and acute hospitalization  Patient's family would like to focus on comfort, dignity, and quality  Medication adjustments have been made to reflect the above  A new MOST form has been completed geared towards comfort measures only  Appreciate [facility] evaluation for [facility]  Ongoing palliative care support  Code Status/Advance Care Planning: DNAR/DNI  Palliative Prophylaxis:  Aspiration, Bowel Regimen, Delirium Protocol, Frequent Pain Assessment, Oral Care, Palliative Wound Care, and Turn Reposition  Additional Recommendations (Limitations, Scope, Preferences): Ongoing support  Psycho-social/Spiritual:  Desire for further Chaplaincy support: Methodist faith Additional Recommendations: Education on progression and end-stage dementias   Prognosis: Limited overall given patient's adult failure to thrive, functional immobility,  Discharge Planning:   Vitals:   12/09/23 2037 12/10/23 0543  BP: 132/60 (!) 146/87  Pulse: 62 65  Resp: 18 18  Temp: 98.1 F (36.7 C) 98.7 F (37.1 C)  SpO2: 98% 93%    Intake/Output Summary (Last 24 hours) at 12/10/2023 0802 Last data filed at 12/10/2023 0630 Gross per 24 hour  Intake 1785.09 ml  Output 200 ml  Net 1585.09 ml   Last Weight  Most recent update: 12/08/2023  6:46 AM    Weight  90 kg (198  lb 6.6 oz)            Gen: Elderly chronically ill-appearing female HEENT: Dry mucous membranes CV: Regular rate and rhythm PULM: On room air breathing is even and unlabored ABD: soft/nontender EXT: No edema Neuro: Alert and oriented to self  PPS: 20%   This conversation/these recommendations were discussed with patient primary care team, Dr. Bevin Bucks ______________________________________________________ [name] Palliative Medicine Team Team Cell Phone:  [phone] Please utilize secure chat with additional questions, if there is no response within 30 minutes please call the above phone number  Total Time: 75 Billing based on MDM: High  Palliative Medicine Team providers are available by phone from 7am to 7pm daily and can be reached through the team cell phone.  Should this patient require assistance outside of these hours, please call the patient's attending physician.

## 2023-12-10 NOTE — Plan of Care (Signed)
   Problem: Education: Goal: Knowledge of General Education information will improve Description Including pain rating scale, medication(s)/side effects and non-pharmacologic comfort measures Outcome: Progressing

## 2023-12-10 NOTE — Assessment & Plan Note (Addendum)
 300 mL p.o. intake charted in last 24 hours.  Repeat total bilirubin normal at 0.6.  Palliative care consult placed yesterday -- Nursing order to assist with feeds and offer 250 cc fluids every 4 hours -- Routine I/os

## 2023-12-10 NOTE — TOC Progression Note (Signed)
 Transition of Care [facility]) - Progression Note    Patient Details  Name: Sandra [name] MRN: [id number] Date of Birth: [birth year]  [facility] CM/SW Contact  Francesco Provencal A [place], LCSW Phone Number: 12/10/2023, 11:37 AM  Clinical Narrative:     CSW spoke with pt's spouse Sandra [name] to provided bed offers with Medicare.gov ratings. [facility].   He also stated family is to have a meeting with Palliative team this afternoon. CSW to notify spouse which palliative agency works with [facility] as they requested that be the collective CSW send referral.   CSW reached out to [place], waiting to hear back on bed availability and palliative agency.    TOC will continue to follow.     Expected Discharge Plan: Skilled Nursing Facility Barriers to Discharge: Continued Medical Work up, SNF Pending bed offer  Expected Discharge Plan and Services                                               Social Determinants of Health (SDOH) Interventions SDOH Screenings   Food Insecurity: No Food Insecurity (12/08/2023)  Housing: Low Risk  (12/08/2023)  Transportation Needs: No Transportation Needs (12/08/2023)  Utilities: Not At Risk (12/08/2023)  Tobacco Use: Medium Risk (12/08/2023)    Readmission Risk Interventions     No data to display

## 2023-12-11 DIAGNOSIS — F03C2 Unspecified dementia, severe, with psychotic disturbance: Secondary | ICD-10-CM | POA: Diagnosis not present

## 2023-12-11 MED ORDER — HYDROMORPHONE HCL 1 MG/ML IJ SOLN: 0.5000 mg | INTRAMUSCULAR | Status: DC | PRN

## 2023-12-11 MED ORDER — ORAL CARE MOUTH RINSE: 15.0000 mL | OROMUCOSAL | Status: DC | PRN

## 2023-12-11 MED ORDER — HYDROMORPHONE HCL 1 MG/ML IJ SOLN
1.0000 mg | INTRAMUSCULAR | Status: DC
  Administered 2023-12-11 – 2023-12-12 (×5): 1 mg via INTRAVENOUS
  Filled 2023-12-11 (×5): qty 1

## 2023-12-11 NOTE — Progress Notes (Signed)
     Daily Progress Note Intern Pager: [phone]  Patient name: Sandra [name] Medical record number: [id number] Date of birth: [birth year] Age: 83 y.o. Gender: female  Primary Care Provider: No primary care provider on file. Consultants: Palliative Code Status: DNR comfort care  Pt Overview and Major Events to Date:  6/8: Admitted to FM TS, concern for sepsis and received antibiotics 6/10: Met with palliative, transition to comfort care  Assessment and Plan: Sandra [name] is an 83 year old female with history of dementia presented after a fall at home, met criteria for severe sepsis and received antibiotics though never had clear source of infection.  Had negative urine and blood cultures, antibiotics and IVF stopped on 6/10.  Palliative care met with patient and husband, patient was made to transition patient to comfort care.  Assessment & Plan Poor fluid intake Palliative care saw patient yesterday, decision made to transition to comfort care.  Comfort care orders per palliative care --[facility] consult for placement, family would like residential hospice at [facility].  No bed available today, will continue to follow TOC recommendations Severe sepsis (HCC) due to AKI, AMS, and oxygen requirement (Resolved: 12/11/2023) No signs of infection, workup negative.  Patient is currently comfort care Chronic health problem Dementia: Comfort care orders per palliative care Osteopenia: Not treating as patient is comfort care Chronic back pain: As needed pain medicines per comfort care orders   FEN/GI: Regular PPx: None, patient is comfort care Dispo: Hospice facility versus home hospice  Subjective:  Patient sitting up in bed, husband not in room.  Patient says she feels well, denies any acute complaints.  Objective: Temp:  [98.1 F (36.7 C)-98.4 F (36.9 C)] 98.1 F (36.7 C) (06/11 0718) Pulse Rate:  [59-67] 59 (06/11 0718) Resp:  [18] 18 (06/11 0718) BP: (123-178)/(68-84) 132/71 (06/11  0718) SpO2:  [96 %-99 %] 96 % (06/11 0718) Physical Exam: General: Resting comfortably in bed, awake and conversant, in no acute distress Cardiovascular: RRR, normal S1/S2 Respiratory:, Normal WB on RA Psych: Pleasantly demented, not agitated  Laboratory: Most recent CBC Lab Results  Component Value Date   WBC 6.4 12/10/2023   HGB 13.4 12/10/2023   HCT 42.2 12/10/2023   MCV 94.2 12/10/2023   PLT 145 (L) 12/10/2023   Most recent BMP    Latest Ref Rng & Units 12/10/2023    5:04 AM  BMP  Glucose 70 - 99 mg/dL 87   BUN 8 - 23 mg/dL 8   Creatinine 0.34 - 7.42 mg/dL 5.95   Sodium 638 - 756 mmol/L 139   Potassium 3.5 - 5.1 mmol/L 3.1   Chloride 98 - 111 mmol/L 104   CO2 22 - 32 mmol/L 24   Calcium  8.9 - 10.3 mg/dL 8.2     Imaging/Diagnostic Tests: None  Naida Austria, MD 12/11/2023, 7:58 AM  PGY-1,  Family Medicine FPTS Intern pager: [phone], text pages welcome Secure chat group [facility] Teaching Service

## 2023-12-11 NOTE — Progress Notes (Signed)
 [facility] [phone] [facility]  Hospice hospital liaison note   Referral received from [facility] for family interest in [facility].     Chart currently under review, eligibility is pending at this time.    Unfortunately [facility] is unable to offer a bed today.   TOC aware that liaison will follow up tomorrow or sooner if a bed becomes available.    Thank you for the opportunity to participate in this patient's care  Ardine Beckwith, LPN Hospice nurse liaison [phone]

## 2023-12-11 NOTE — Progress Notes (Signed)
   Palliative Medicine Inpatient Follow Up Note HPI: Sandra [name] is an 83 year old female with PMH of dementia, osteopenia, chronic back pain who presented with a fall at home, met criteria for severe sepsis due to end organ damage with AKI, AMS (per husband, mental status worsen compared to baseline), and oxygen requirement (placed on 2 L [facility] in ED).  Palliative care has been asked to support additional goals of care conversations.   Today's Discussion 12/11/2023  *Please note that this is a verbal dictation therefore any spelling or grammatical errors are due to the Dragon Medical One system interpretation.  Chart reviewed inclusive of vital signs, progress notes, laboratory results, and diagnostic images.   I spoke with patients RN, Sandra [name] this morning who shared with me that patient pain has not been well controlled. We discussed options for better pain management and it was agreed that making medication ATC would likely be to most helpful intervention for the patient.   I met with Sandra [name] at bedside this morning. She is awake and aware of self and is able to express to be that she feels uncomfortable this morning. She is in agreement with getting out of pain. She asked me when her husband would come by today and we reviewed that he normally comes in the afternoon.   Per [facility] liaison there is not an inpatient bed available today though they plan to come and assess Sandra [name] again tomorrow.   Questions and concerns addressed/Palliative Support Provided.   Objective Assessment: Vital Signs Vitals:   12/10/23 2051 12/11/23 0718  BP: (!) 162/73 132/71  Pulse: 67 (!) 59  Resp:  18  Temp: 98.4 F (36.9 C) 98.1 F (36.7 C)  SpO2: 99% 96%    Intake/Output Summary (Last 24 hours) at 12/11/2023 1343 Last data filed at 12/11/2023 0600 Gross per 24 hour  Intake 60 ml  Output 500 ml  Net -440 ml   Last Weight  Most recent update: 12/08/2023  6:46 AM    Weight  90 kg (198 lb 6.6 oz)             Gen: Elderly chronically ill-appearing female HEENT: Dry mucous membranes CV: Regular rate and rhythm PULM: On room air breathing is even and unlabored ABD: soft/nontender EXT: No edema Neuro: Alert and oriented to self  SUMMARY OF RECOMMENDATIONS   DNAR/DNI  Comfort Care  Stop morphine  - Start dilaudid  1mg  IVP Q4H ATC  Additional comfort medications per [facility]   Appreciate [facility] evaluation for [facility]   Ongoing palliative care support ______________________________________________________________________________________ Sandra [name] [name] Palliative Medicine Team Team Cell Phone: [phone] Please utilize secure chat with additional questions, if there is no response within 30 minutes please call the above phone number  Billing based on MDM: High  Palliative Medicine Team providers are available by phone from 7am to 7pm daily and can be reached through the team cell phone.  Should this patient require assistance outside of these hours, please call the patient's attending physician.

## 2023-12-11 NOTE — TOC Progression Note (Signed)
 Transition of Care [facility]) - Progression Note    Patient Details  Name: Sandra [name] MRN: [id number] Date of Birth: 26-Jul-1940  [facility]) CM/SW Contact  Tom-Johnson, Angelique Ken, RN Phone Number: 12/11/2023, 1:24 PM  Clinical Narrative:     Patient transitioned to Comfort Care. Family requests Residential hospice and chose [facility] at [facility]. Moira Andrews, Liaison with [facility] informed CM that there is no bed available today. She will reassess patient tomorrow for placement.  CM will continue to follow and render compassionate support at this time.        Expected Discharge Plan: Skilled Nursing Facility Barriers to Discharge: Continued Medical Work up, SNF Pending bed offer  Expected Discharge Plan and Services                                               Social Determinants of Health (SDOH) Interventions SDOH Screenings   Food Insecurity: No Food Insecurity (12/08/2023)  Housing: Low Risk  (12/08/2023)  Transportation Needs: No Transportation Needs (12/08/2023)  Utilities: Not At Risk (12/08/2023)  Tobacco Use: Medium Risk (12/08/2023)    Readmission Risk Interventions     No data to display

## 2023-12-11 NOTE — Assessment & Plan Note (Addendum)
 Dementia: Comfort care orders per palliative care Osteopenia: Not treating as patient is comfort care Chronic back pain: As needed pain medicines per comfort care orders

## 2023-12-11 NOTE — Care Management Important Message (Signed)
 Important Message  Patient Details  Name: Sandra [name] MRN: [id number] Date of Birth: [birth year]   Important Message Given:        Wynonia Hedges 12/11/2023, 11:42 AM

## 2023-12-11 NOTE — Assessment & Plan Note (Signed)
 No signs of infection, workup negative.  Patient is currently comfort care

## 2023-12-11 NOTE — Assessment & Plan Note (Addendum)
 Palliative care saw patient yesterday, decision made to transition to comfort care.  Comfort care orders per palliative care --[facility] consult for placement, family would like residential hospice at [facility].  No bed available today, will continue to follow TOC recommendations

## 2023-12-12 DIAGNOSIS — R6520 Severe sepsis without septic shock: Secondary | ICD-10-CM

## 2023-12-12 DIAGNOSIS — N179 Acute kidney failure, unspecified: Secondary | ICD-10-CM | POA: Diagnosis not present

## 2023-12-12 DIAGNOSIS — F03C2 Unspecified dementia, severe, with psychotic disturbance: Secondary | ICD-10-CM

## 2023-12-12 DIAGNOSIS — A419 Sepsis, unspecified organism: Secondary | ICD-10-CM

## 2023-12-12 DIAGNOSIS — Z7189 Other specified counseling: Secondary | ICD-10-CM | POA: Diagnosis not present

## 2023-12-12 DIAGNOSIS — Z515 Encounter for palliative care: Secondary | ICD-10-CM

## 2023-12-12 DIAGNOSIS — R52 Pain, unspecified: Secondary | ICD-10-CM

## 2023-12-12 LAB — CULTURE, BLOOD (ROUTINE X 2)
Culture: NO GROWTH
Culture: NO GROWTH

## 2023-12-12 MED ORDER — MORPHINE SULFATE (PF) 2 MG/ML IV SOLN
2.0000 mg | INTRAVENOUS | Status: DC | PRN
  Administered 2023-12-12 – 2023-12-13 (×4): 2 mg via INTRAVENOUS
  Filled 2023-12-12 (×4): qty 1

## 2023-12-12 MED ORDER — OXYCODONE HCL 5 MG PO TABS
10.0000 mg | ORAL_TABLET | ORAL | Status: DC
  Administered 2023-12-12 – 2023-12-13 (×6): 10 mg via ORAL
  Filled 2023-12-12 (×7): qty 2

## 2023-12-12 NOTE — Progress Notes (Incomplete)
 Patient and patient's husband voiced concerns related to scheduled Dilaudid .   Pt and her husband advised that the dilaudid  is not helping with patients pain as well as it caused patient to go crazy last night.  Pt endorsed hallucinations of bugs and generalized feelings of unease after the 3rd dose of Dilaudid  she received.   Patient and her husband inquired if scheduled 10 mg of oxycodone  could be ordered instead of scheduled dilaudid .  MD Dawn Eth and Palliative NP Avel Leiter notified.

## 2023-12-12 NOTE — Plan of Care (Signed)
   Problem: Education: Goal: Knowledge of General Education information will improve Description Including pain rating scale, medication(s)/side effects and non-pharmacologic comfort measures Outcome: Progressing

## 2023-12-12 NOTE — TOC Progression Note (Signed)
 Transition of Care [facility]) - Progression Note    Patient Details  Name: Sandra [name] MRN: [id number] Date of Birth: June 16, 1941  [facility]) CM/SW Contact  Paullette Boston [place], [place] Phone Number: 12/12/2023, 12:53 PM  Clinical Narrative:  [name] to Adolm Ahumada with [facility] re [facility] referral. Per Adolm Ahumada, pt is still being reviewed and they will update hopefully later today. SW will follow.   Paullette Boston, MSW, LCSW [phone] (coverage)      Expected Discharge Plan: Skilled Nursing Facility Barriers to Discharge: Continued Medical Work up, SNF Pending bed offer  Expected Discharge Plan and Services                                               Social Determinants of Health (SDOH) Interventions SDOH Screenings   Food Insecurity: No Food Insecurity (12/08/2023)  Housing: Low Risk  (12/08/2023)  Transportation Needs: No Transportation Needs (12/08/2023)  Utilities: Not At Risk (12/08/2023)  Tobacco Use: Medium Risk (12/08/2023)    Readmission Risk Interventions     No data to display

## 2023-12-12 NOTE — Assessment & Plan Note (Addendum)
 Deferring treatment for patient's chronic health issues given comfort care status

## 2023-12-12 NOTE — Progress Notes (Signed)
     Daily Progress Note Intern Pager: [phone]  Patient name: Sandra [name] Medical record number: [id number] Date of birth: 27-Aug-1940 Age: 83 y.o. Gender: female  Primary Care Provider: No primary care provider on file. Consultants: Palliative Code Status: DNR comfort care  Pt Overview and Major Events to Date:  6/8: Admitted to FM TS, concern for sepsis and received antibiotics 6/10: Met with palliative, transition to comfort care  Assessment and Plan: Sandra [name] is an 83 year old female with a history of dementia who presented after a fall at home.  Has been transition to comfort care, currently palliative care is managing her pain and she is pending placement at [facility]. Assessment & Plan Severe dementia with psychotic disturbance (HCC) Palliative care adjusting patient's pain medicine, patient had bad reaction to Dilaudid . --Pain regimen per palliative, currently oxycodone  10 mg every 4 hours with IV morphine  for breakthrough pain --Discontinue Dilaudid  --Pending bed availability at [facility] Chronic health problem Deferring treatment for patient's chronic health issues given comfort care status   FEN/GI: Regular PPx: None, comfort care Dispo: Pending bed at hospice  Subjective:  Patient is resting comfortably in bed with husband at bedside.  Husband states that patient was having hallucinations did not sleep well after getting Dilaudid .  Otherwise patient and husband have no complaints at this time.  Objective: Temp:  [98 F (36.7 C)-98.2 F (36.8 C)] 98.2 F (36.8 C) (06/12 0530) Pulse Rate:  [71-77] 77 (06/12 0530) Resp:  [18] 18 (06/12 0530) BP: (116-125)/(64-65) 125/65 (06/12 0530) SpO2:  [96 %] 96 % (06/12 0530) Physical Exam: General: Sitting up in bed, awake and responding to questions, in no acute distress Pulm: Normal WOB on RA Psych: Pleasantly demented    Sandra Austria, MD 12/12/2023, 8:30 AM  PGY-1, [facility] Family  Medicine FPTS Intern pager: [phone], text pages welcome Secure chat group [name] Teaching Service

## 2023-12-12 NOTE — Progress Notes (Signed)
 Daily Progress Note   Patient Name: Sandra [name]       Date: 12/12/2023 DOB: May 08, 1941  Age: 83 y.o. MRN#: [id number] Attending Physician: Candee Cha, MD Primary Care Physician: No primary care provider on file. Admit Date: 12/08/2023  Reason for Consultation/Follow-up: Pain control  Subjective: Pain all over, didn't sleep well, restless  Length of Stay: 4  Current Medications: Scheduled Meds:   acetaminophen   650 mg Oral Q6H   Or   acetaminophen   650 mg Rectal Q6H   oxyCODONE   10 mg Oral Q4H   polyethylene glycol  17 g Oral Daily    Continuous Infusions:   PRN Meds: antiseptic oral rinse, artificial tears, glycopyrrolate  **OR** glycopyrrolate  **OR** glycopyrrolate , haloperidol **OR** haloperidol **OR** haloperidol lactate, LORazepam, morphine  injection, ondansetron  **OR** ondansetron  (ZOFRAN ) IV, mouth rinse  Physical Exam Constitutional:      General: She is not in acute distress.    Appearance: She is ill-appearing.  Pulmonary:     Effort: Pulmonary effort is normal.   Skin:    General: Skin is warm and dry.   Neurological:     Mental Status: She is alert. She is disoriented.   Psychiatric:        Cognition and Memory: Cognition is impaired.             Vital Signs: BP 125/65 (BP Location: Right Arm)   Pulse 77   Temp 98.2 F (36.8 C) (Oral)   Resp 18   Ht 5' 2 (1.575 m)   Wt 90 kg   SpO2 96%   BMI 36.29 kg/m  SpO2: SpO2: 96 % O2 Device: O2 Device: Room Air O2 Flow Rate: O2 Flow Rate (L/min): 2 L/min  Intake/output summary: No intake or output data in the 24 hours ending 12/12/23 1013 LBM: Last BM Date : 12/11/23 Baseline Weight: Weight: 90 kg Most recent weight: Weight: 90 kg          Patient Active Problem List   Diagnosis Date Noted   Severe  dementia with psychotic disturbance (HCC) 12/11/2023   Poor fluid intake 12/09/2023   Chronic health problem 12/08/2023   Abdominal pain 12/08/2023   Displaced fracture of right femoral neck (HCC) 08/16/2017   Hip fracture (HCC) 08/13/2017   Fall 08/13/2017   Normocytic anemia 08/13/2017   DVT (deep venous thrombosis) (HCC) history of    Closed fracture of neck of right femur (HCC)    OA (osteoarthritis) of knee 07/29/2017   Other secondary scoliosis, thoracolumbar region 10/07/2014    Palliative Care Assessment & Plan   HPI: Sandra [name] is an 83 year old female with PMH of dementia, osteopenia, chronic back pain who presented with a fall at home, met criteria for severe sepsis due to end organ damage with AKI, AMS (per husband, mental status worsen compared to baseline), and oxygen requirement (placed on 2 L [facility] in ED). Palliative care has been asked to support additional goals of care conversations. During previous goals of care conversations decision was made to pursue comfort care path.   Assessment: Received message from nursing staff this AM that patient was not tolerating dilaudid  well and asking for assistance managing her symptoms.  Went to bedside -  spouse at bedside. Patient is awake, slightly restless. Complains of pain all over but cannot specify more than that about severity of specific location/type of pain. Appears generally uncomfortable. Spouse shares patient was hallucinating and restless last night. Shares this started after her 3rd dose of dilaudid  and he attributes this behavior to her dilaudid . We review that patient takes oxycodone  at home regularly for her chronic pain.  We discuss a plan to use oxycodone  10 mg scheduled every 4 hours with IV morphine  for breakthrough pain. Patient does have a codeine allergy listed - reaction is headache so will move forward with morphine . Per spouse, he believes she has had morphine  in the past and tolerated it well.  Hospice  liaison involved for placement.   Recommendations/Plan: DNR/DNI Comfort measures only Stop dilaudid  - did not tolerate per spouse Schedule oxycodone  10 mg q4hr IV  morphine  for breakthrough pain Dispo per hospice liaison/TOC team  Goals of Care and Additional Recommendations: Limitations on Scope of Treatment: Full Comfort Care  Code Status: DNR  Care plan was discussed with patient, spouse, RN  Thank you for allowing the Palliative Medicine Team to assist in the care of this patient.   Total Time 40 minutes Prolonged Time Billed  no   Time spent includes: Detailed review of medical records (labs, imaging, vital signs), medically appropriate exam, discussion with treatment team, counseling and educating patient, family and/or staff, documenting clinical information, medication management and coordination of care.     *Please note that this is a verbal dictation therefore any spelling or grammatical errors are due to the Dragon Medical One system interpretation.  Alvino Aye, DNP, [facility] Palliative Medicine Team Team Phone # [phone]  Pager [phone]

## 2023-12-12 NOTE — Assessment & Plan Note (Deleted)
    Palliative care saw patient yesterday, decision made to transition to comfort care.  Comfort care orders per palliative care --[facility] consult for placement, family would like residential hospice at [facility].  No bed available today, will continue to follow TOC recommendations

## 2023-12-12 NOTE — Assessment & Plan Note (Signed)
 Palliative care adjusting patient's pain medicine, patient had bad reaction to Dilaudid . --Pain regimen per palliative, currently oxycodone  10 mg every 4 hours with IV morphine  for breakthrough pain --Discontinue Dilaudid  --Pending bed availability at [facility]

## 2023-12-13 DIAGNOSIS — R6520 Severe sepsis without septic shock: Secondary | ICD-10-CM | POA: Diagnosis not present

## 2023-12-13 DIAGNOSIS — F03C2 Unspecified dementia, severe, with psychotic disturbance: Secondary | ICD-10-CM | POA: Diagnosis not present

## 2023-12-13 DIAGNOSIS — Z66 Do not resuscitate: Secondary | ICD-10-CM

## 2023-12-13 DIAGNOSIS — Z515 Encounter for palliative care: Secondary | ICD-10-CM | POA: Diagnosis not present

## 2023-12-13 DIAGNOSIS — R638 Other symptoms and signs concerning food and fluid intake: Secondary | ICD-10-CM

## 2023-12-13 DIAGNOSIS — A419 Sepsis, unspecified organism: Secondary | ICD-10-CM | POA: Diagnosis not present

## 2023-12-13 MED ORDER — MORPHINE SULFATE (PF) 2 MG/ML IV SOLN: 2.0000 mg | INTRAVENOUS | Status: AC | PRN

## 2023-12-13 MED ORDER — POLYVINYL ALCOHOL 1.4 % OP SOLN
1.0000 [drp] | Freq: Four times a day (QID) | OPHTHALMIC | Status: AC | PRN
Start: 2023-12-13 — End: ?

## 2023-12-13 MED ORDER — POLYETHYLENE GLYCOL 3350 17 G PO PACK: 17.0000 g | PACK | Freq: Every day | ORAL | Status: AC

## 2023-12-13 MED ORDER — BIOTENE DRY MOUTH MT LIQD: 15.0000 mL | OROMUCOSAL | Status: AC | PRN

## 2023-12-13 MED ORDER — ORAL CARE MOUTH RINSE
15.0000 mL | OROMUCOSAL | Status: AC | PRN
Start: 2023-12-13 — End: ?

## 2023-12-13 MED ORDER — HALOPERIDOL 0.5 MG PO TABS: 0.5000 mg | ORAL_TABLET | ORAL | Status: AC | PRN

## 2023-12-13 MED ORDER — OXYCODONE HCL 10 MG PO TABS: 10.0000 mg | ORAL_TABLET | ORAL | Status: AC

## 2023-12-13 MED ORDER — LORAZEPAM 2 MG/ML IJ SOLN: 0.5000 mg | INTRAMUSCULAR | Status: AC | PRN

## 2023-12-13 MED ORDER — GLYCOPYRROLATE 1 MG PO TABS: 1.0000 mg | ORAL_TABLET | ORAL | Status: AC | PRN

## 2023-12-13 MED ORDER — ONDANSETRON 4 MG PO TBDP
4.0000 mg | ORAL_TABLET | Freq: Four times a day (QID) | ORAL | Status: AC | PRN
Start: 2023-12-13 — End: ?

## 2023-12-13 MED ORDER — ACETAMINOPHEN 325 MG PO TABS: 650.0000 mg | ORAL_TABLET | Freq: Four times a day (QID) | ORAL | Status: AC

## 2023-12-13 NOTE — Discharge Summary (Signed)
 Family Medicine Teaching [facility] Discharge Summary  Patient name: Sandra [name] Medical record number: [id number] Date of birth: 06/09/1941 Age: 83 y.o. Gender: female Date of Admission: 12/08/2023  Date of Discharge: 12/13/23 Admitting Physician: Clyda Dark, DO  Primary Care Provider: No primary care provider on file. Consultants: palliative  Indication for Hospitalization: Severe sepsis  Discharge Diagnoses/Problem List:  Principal Problem for Admission: Severe sepsis Other Problems addressed during stay:  Active Problems:   Chronic health problem   Abdominal pain   Poor fluid intake   Severe dementia with psychotic disturbance [facility])    Brief Hospital Course:  Sandra [name] is a 83 y.o.female with a history of dementia, osteopenia, chronic back pain who was admitted to the family medicine teaching Service at [facility] due to a fall at home. Her hospital course is detailed below:  Fall Dementia Poor p.o. intake Patient fell at home without LOC or striking her head.  Has a history of worsening dementia with steady decline since 2020 and poor p.o. intake over the last month in addition to multiple falls at home. Met criteria for severe sepsis on admission due to report of acutely worsening mental status, AKI, slightly elevated lactic acid.  Received fluid bolus and IV antibiotics.  Patient had reported dysuria but there was no evidence of acute UTI on UA and there was no leukocytosis as well as abnormal vitals.  Symptoms were likely due to dehydration in setting of poor p.o. intake.  Antibiotics were discontinued the following morning due to no evidence of acute infection and patient at mental status baseline per family.  Patient and husband met with palliative care, decision was made to transfer patient to DNR comfort care.  Medical interventions were discontinued at this time, palliative care managed patient's pain regimen during hospitalization (oxycodone  10 mg every 4 hours,  IV morphine  for breakthrough pain).  Patient was discharged to [facility] on 12/13/23.    Other chronic conditions were medically managed with home medications and formulary alternatives as necessary (none)  PCP Follow-up Recommendations: N/a - hospice care   Disposition: [facility]  Discharge Condition: stable, comfort care  Discharge Exam:  Vitals:   12/12/23 0530 12/12/23 2245  BP: 125/65 126/60  Pulse: 77 89  Resp: 18 18  Temp: 98.2 F (36.8 C) 98.4 F (36.9 C)  SpO2: 96% 97%   Per Dr. Sampson Critchley on 12/13/23 General: Sitting up in bed, awake and responding to questions, in no acute distress Pulm: Normal WOB on RA Psych: Pleasantly demented  Significant Procedures: n/a  Significant Labs and Imaging:      Latest Ref Rng & Units 12/10/2023    5:04 AM 12/09/2023   11:14 AM 12/09/2023    8:44 AM  CMP  Glucose 70 - 99 mg/dL 87   93   BUN 8 - 23 mg/dL 8   10   Creatinine 8.65 - 1.00 mg/dL 7.84   6.96   Sodium 295 - 145 mmol/L 139   138   Potassium 3.5 - 5.1 mmol/L 3.1   3.4   Chloride 98 - 111 mmol/L 104   104   CO2 22 - 32 mmol/L 24   25   Calcium  8.9 - 10.3 mg/dL 8.2   8.0   Total Protein 6.5 - 8.1 g/dL  5.6    Total Bilirubin 0.0 - 1.2 mg/dL  0.6    Alkaline Phos 38 - 126 U/L  40    AST 15 - 41  U/L  24    ALT 0 - 44 U/L  19        Latest Ref Rng & Units 12/10/2023    5:04 AM 12/09/2023    8:44 AM 12/08/2023    6:57 AM  CBC  WBC 4.0 - 10.5 K/uL 6.4  7.5  9.6   Hemoglobin 12.0 - 15.0 g/dL 16.1  09.6  04.5   Hematocrit 36.0 - 46.0 % 42.2  41.2  52.5   Platelets 150 - 400 K/uL 145  167  230     CT Head IMPRESSION: Atrophy and chronic small vessel ischemic changes. No acute intracranial process identified.  CT Abd/Pelv W Contrast IMPRESSION: 1. No acute intrathoracic, abdominal, or pelvic pathology. No CT evidence of pulmonary artery embolus. 2. Sigmoid diverticulosis. No bowel obstruction. 3. Fatty liver. 4.  Aortic Atherosclerosis  (ICD10-I70.0).    CTA PE: IMPRESSION: 1. No acute intrathoracic, abdominal, or pelvic pathology. No CT evidence of pulmonary artery embolus. 2. Sigmoid diverticulosis. No bowel obstruction. 3. Fatty liver. 4.  Aortic Atherosclerosis (ICD10-I70.0).  Results/Tests Pending at Time of Discharge: n/a  Discharge Medications:  Allergies as of 12/13/2023       Reactions   Clarithromycin Diarrhea   Codeine Other (See Comments)   Headache   Resinol [dermatological Products, Misc.] Other (See Comments)   Skin was like raw meat..Skin peeled off.   Sulfa Antibiotics Rash        Medication List     STOP taking these medications    divalproex  125 MG capsule Commonly known as: DEPAKOTE  SPRINKLE   donepezil  10 MG tablet Commonly known as: ARICEPT    gabapentin  400 MG capsule Commonly known as: NEURONTIN        TAKE these medications    acetaminophen  325 MG tablet Commonly known as: TYLENOL  Take 2 tablets (650 mg total) by mouth every 6 (six) hours.   antiseptic oral rinse Liqd Apply 15 mLs topically as needed for dry mouth.   mouth rinse Liqd solution 15 mLs by Mouth Rinse route as needed (for oral care).   artificial tears ophthalmic solution Place 1 drop into both eyes 4 (four) times daily as needed for dry eyes.   glycopyrrolate  1 MG tablet Commonly known as: ROBINUL  Take 1 tablet (1 mg total) by mouth every 4 (four) hours as needed (excessive secretions).   haloperidol  0.5 MG tablet Commonly known as: HALDOL  Take 1 tablet (0.5 mg total) by mouth every 4 (four) hours as needed for agitation (or delirium).   LORazepam  2 MG/ML injection Commonly known as: ATIVAN  Inject 0.25-0.5 mLs (0.5-1 mg total) into the vein every 4 (four) hours as needed for anxiety, seizure or sedation.   morphine  (PF) 2 MG/ML injection Inject 1 mL (2 mg total) into the vein every 2 (two) hours as needed.   ondansetron  4 MG disintegrating tablet Commonly known as: ZOFRAN -ODT Take 1  tablet (4 mg total) by mouth every 6 (six) hours as needed for nausea.   Oxycodone  HCl 10 MG Tabs Take 1 tablet (10 mg total) by mouth every 4 (four) hours. What changed:  how much to take when to take this reasons to take this additional instructions   polyethylene glycol 17 g packet Commonly known as: MIRALAX  / GLYCOLAX  Take 17 g by mouth daily. Start taking on: December 14, 2023        Discharge Instructions: Please refer to Patient Instructions section of EMR for full details.  Patient was counseled important signs and symptoms that should  prompt return to medical care, changes in medications, dietary instructions, activity restrictions, and follow up appointments.   Follow-Up Appointments:   Edison Gore, MD 12/13/2023, 2:23 PM PGY-2, [facility] Family Medicine

## 2023-12-13 NOTE — Assessment & Plan Note (Addendum)
 Palliative care adjusting patient's pain medicine.  Currently pain seems well-controlled --Pain regimen per palliative, currently oxycodone  10 mg every 4 hours with IV morphine  2mg  for breakthrough pain --Also has Haldol , Ativan  as needed for agitation, anxiety, seizures, sedation --Pending bed availability at [facility]

## 2023-12-13 NOTE — Progress Notes (Signed)
 PT[facility] called and set up, [employer] called and given report on patient.

## 2023-12-13 NOTE — Progress Notes (Incomplete)
 [facility] [phone] [facility] Liaison Note  Received request from [facility] manager for family interest in [facility]. Eligibility confirmed. Met with patient and family to confirm interest and explain services. Family agreeable to transfer today. TOC aware. RN please call report to [phone] prior to patient leaving the unit. Please send signed DNR with patient at discharge.   Thank you for allowing us  to participate in this patient's care.  Ardine Beckwith, LPN [facility] Liaison [phone]

## 2023-12-13 NOTE — Assessment & Plan Note (Signed)
 Deferring treatment for patient's chronic health issues given comfort care status

## 2023-12-13 NOTE — TOC Transition Note (Signed)
 Transition of Care [facility]) - Discharge Note   Patient Details  Name: Sandra [name] MRN: [id number] Date of Birth: 18-Mar-1941  Transition of Care [facility]) CM/SW Contact:  Jeffory M[place], [place] Phone Number: 12/13/2023, 2:35 PM   Clinical Narrative: Per Lovett Ruck with [facility], pt has been approved and they are prepared to admit today. Pt's spouse aware of dc and reports agreeable. RN provided with number for report and PTAR arranged for transport. RN to call PTAR for pick up once [facility] consents are complete. SW signing off at dc.   Paullette Boston, MSW, LCSW [phone] (coverage)        Final next level of care: Hospice Medical Facility Barriers to Discharge: Barriers Resolved   Patient Goals and CMS Choice            Discharge Placement                Patient to be transferred to facility by: PTAR Name of family member notified: [name]/spouse Patient and family notified of of transfer: 12/13/23  Discharge Plan and Services Additional resources added to the After Visit Summary for                                       Social Drivers of Health (SDOH) Interventions SDOH Screenings   Food Insecurity: No Food Insecurity (12/08/2023)  Housing: Low Risk  (12/08/2023)  Transportation Needs: No Transportation Needs (12/08/2023)  Utilities: Not At Risk (12/08/2023)  Tobacco Use: Medium Risk (12/08/2023)     Readmission Risk Interventions     No data to display

## 2023-12-13 NOTE — Progress Notes (Signed)
     Daily Progress Note Intern Pager: [phone]  Patient name: Sandra [name] Medical record number: [id number] Date of birth: 12-Jun-1941 Age: 83 y.o. Gender: female  Primary Care Provider: No primary care provider on file. Consultants: Palliative Code Status: DNR comfort care  Pt Overview and Major Events to Date:  6/8: Admitted to FM TS, concern for sepsis and received antibiotics 6/10: Met with palliative, transition to comfort care  Assessment and Plan: Sandra [name] is an 83 year old female with a history of dementia who presented after a fall at home. Has been transitioned to comfort care, currently palliative care is managing her pain and she is pending placement at [facility].  Assessment & Plan Severe dementia with psychotic disturbance (HCC) Palliative care adjusting patient's pain medicine.  Currently pain seems well-controlled --Pain regimen per palliative, currently oxycodone  10 mg every 4 hours with IV morphine  2mg  for breakthrough pain --Also has Haldol , Ativan  as needed for agitation, anxiety, seizures, sedation --Pending bed availability at [facility] Chronic health problem Deferring treatment for patient's chronic health issues given comfort care status   FEN/GI: Regular PPx: None, comfort care Dispo: Pending hospice bed  Subjective:  Patient resting comfortably in bed on my evaluation this morning.  Daughter at bedside, provided update about current status and plan.  Patient and daughter did not have any questions or concerns.  Objective: Temp:  [98.4 F (36.9 C)] 98.4 F (36.9 C) (06/12 2245) Pulse Rate:  [89] 89 (06/12 2245) Resp:  [18] 18 (06/12 2245) BP: (126)/(60) 126/60 (06/12 2245) SpO2:  [97 %] 97 % (06/12 2245) Physical Exam: General: Resting comfortably in bed, awake and alert, in no acute distress Cardiovascular: Deferred Respiratory: Normal WOB on RA Abdomen: Deferred Extremities: Deferred Psych: Pleasantly demented  No  recent lab work or imaging  Sandra Austria, MD 12/13/2023, 7:11 AM  PGY-1, [facility] FPTS Intern pager: [phone], text pages welcome Secure chat group [facility] Teaching Service

## 2023-12-13 NOTE — Progress Notes (Addendum)
 Daily Progress Note   Patient Name: Sandra [name]       Date: 12/13/2023 DOB: 1941/02/10  Age: 83 y.o. MRN#: [id number] Attending Physician: Candee Cha, MD Primary Care Physician: No primary care provider on file. Admit Date: 12/08/2023  Reason for Consultation/Follow-up: Pain control  Subjective: Patient not interactive today, spouse reports resting better and pain better controlled unless staff is cleaning her  Length of Stay: 5  Current Medications: Scheduled Meds:   acetaminophen   650 mg Oral Q6H   Or   acetaminophen   650 mg Rectal Q6H   oxyCODONE   10 mg Oral Q4H   polyethylene glycol  17 g Oral Daily    Continuous Infusions:   PRN Meds: antiseptic oral rinse, artificial tears, glycopyrrolate  **OR** glycopyrrolate  **OR** glycopyrrolate , haloperidol  **OR** haloperidol  **OR** haloperidol  lactate, LORazepam , morphine  injection, ondansetron  **OR** ondansetron  (ZOFRAN ) IV, mouth rinse  Physical Exam Constitutional:      General: She is not in acute distress.    Appearance: She is ill-appearing.     Comments: Lethargic  Pulmonary:     Effort: Pulmonary effort is normal.   Skin:    General: Skin is warm and dry.   Neurological:     Mental Status: She is disoriented.   Psychiatric:        Cognition and Memory: Cognition is impaired.             Vital Signs: BP 126/60 (BP Location: Right Arm)   Pulse 89   Temp 98.4 F (36.9 C) (Oral)   Resp 18   Ht 5' 2 (1.575 m)   Wt 90 kg   SpO2 97%   BMI 36.29 kg/m  SpO2: SpO2: 97 % O2 Device: O2 Device: Room Air O2 Flow Rate: O2 Flow Rate (L/min): 2 L/min  Intake/output summary:  Intake/Output Summary (Last 24 hours) at 12/13/2023 1615 Last data filed at 12/12/2023 2249 Gross per 24 hour  Intake 120 ml  Output 400 ml  Net  -280 ml   LBM: Last BM Date : 12/12/23 Baseline Weight: Weight: 90 kg Most recent weight: Weight: 90 kg          Patient Active Problem List   Diagnosis Date Noted   Severe dementia with psychotic disturbance (HCC) 12/11/2023   Poor fluid intake 12/09/2023   Chronic health problem 12/08/2023   Abdominal pain 12/08/2023   Displaced fracture of right femoral neck (HCC) 08/16/2017   Hip fracture (HCC) 08/13/2017   Fall 08/13/2017   Normocytic anemia 08/13/2017   DVT (deep venous thrombosis) (HCC) history of    Closed fracture of neck of right femur (HCC)    OA (osteoarthritis) of knee 07/29/2017   Other secondary scoliosis, thoracolumbar region 10/07/2014    Palliative Care Assessment & Plan   HPI: Sandra [name] is an 83 year old female with PMH of dementia, osteopenia, chronic back pain who presented with a fall at home, met criteria for severe sepsis due to end organ damage with AKI, AMS (per husband, mental status worsen compared to baseline), and oxygen requirement (placed on 2 L [facility] in ED). Palliative care has been asked to support additional goals of care conversations. During previous goals of care conversations decision was  made to pursue comfort care path.   Assessment: Today spouse reports better pain control with scheduled oxycodone  and IV morphine  for breakthrough pain.  He does share she still screams when staff works with her.  We discussed premedicating with morphine  prior to staff intervention.  Today patient is not as interactive.  Spouse also shares of little intake.  Only took sips of water  for pills and no other food or liquid intake.  Family remains hopeful for discharge to be completed.  Recommendations/Plan: DNR/DNI Comfort measures only Stop dilaudid  - did not tolerate per spouse Continue scheduled oxycodone  10 mg q4hr IV  morphine  for breakthrough pain Dispo per hospice liaison/TOC team -plans to move to be completed today  Goals of Care and Additional  Recommendations: Limitations on Scope of Treatment: Full Comfort Care  Code Status: DNR  Care plan was discussed with patient, spouse, RN  Thank you for allowing the Palliative Medicine Team to assist in the care of this patient.   Total Time 30 minutes Prolonged Time Billed  no   Time spent includes: Detailed review of medical records (labs, imaging, vital signs), medically appropriate exam, discussion with treatment team, counseling and educating patient, family and/or staff, documenting clinical information, medication management and coordination of care.     *Please note that this is a verbal dictation therefore any spelling or grammatical errors are due to the [employer] system interpretation.  Alvino Aye, DNP, [facility] Palliative Medicine Team Team Phone # [phone]  Pager [phone]

## 2023-12-13 NOTE — Progress Notes (Signed)
 Per patient request, patient given ativan  and morphine  (40 min apart) for transport to [facility]

## 2023-12-31 DEATH — deceased
